# Patient Record
Sex: Female | Born: 1940 | Race: White | Hispanic: No | Smoking: Current every day smoker
Health system: Southern US, Community
[De-identification: ages and names within clinical notes are randomized; demographics above are authoritative.]

## PROBLEM LIST (undated history)

## (undated) DIAGNOSIS — F419 Anxiety disorder, unspecified: Secondary | ICD-10-CM

## (undated) DIAGNOSIS — F329 Major depressive disorder, single episode, unspecified: Secondary | ICD-10-CM

## (undated) DIAGNOSIS — J45909 Unspecified asthma, uncomplicated: Secondary | ICD-10-CM

## (undated) DIAGNOSIS — J449 Chronic obstructive pulmonary disease, unspecified: Secondary | ICD-10-CM

## (undated) DIAGNOSIS — R569 Unspecified convulsions: Secondary | ICD-10-CM

## (undated) DIAGNOSIS — I10 Essential (primary) hypertension: Secondary | ICD-10-CM

## (undated) DIAGNOSIS — J15212 Pneumonia due to Methicillin resistant Staphylococcus aureus: Secondary | ICD-10-CM

## (undated) DIAGNOSIS — F54 Psychological and behavioral factors associated with disorders or diseases classified elsewhere: Secondary | ICD-10-CM

## (undated) DIAGNOSIS — R631 Polydipsia: Secondary | ICD-10-CM

## (undated) DIAGNOSIS — F32A Depression, unspecified: Secondary | ICD-10-CM

## (undated) DIAGNOSIS — E785 Hyperlipidemia, unspecified: Secondary | ICD-10-CM

## (undated) DIAGNOSIS — M436 Torticollis: Secondary | ICD-10-CM

## (undated) DIAGNOSIS — G629 Polyneuropathy, unspecified: Secondary | ICD-10-CM

## (undated) HISTORY — PX: ABDOMINAL HYSTERECTOMY: SHX81

---

## 2003-12-01 ENCOUNTER — Emergency Department: Payer: Self-pay | Admitting: Internal Medicine

## 2003-12-16 ENCOUNTER — Emergency Department: Payer: Self-pay | Admitting: Emergency Medicine

## 2003-12-18 ENCOUNTER — Inpatient Hospital Stay: Payer: Self-pay | Admitting: Internal Medicine

## 2003-12-25 ENCOUNTER — Emergency Department: Payer: Self-pay | Admitting: Emergency Medicine

## 2003-12-27 ENCOUNTER — Emergency Department: Payer: Self-pay | Admitting: Emergency Medicine

## 2004-01-13 ENCOUNTER — Emergency Department: Payer: Self-pay | Admitting: Emergency Medicine

## 2004-01-18 ENCOUNTER — Emergency Department: Payer: Self-pay | Admitting: Emergency Medicine

## 2004-02-13 ENCOUNTER — Emergency Department: Payer: Self-pay | Admitting: General Practice

## 2004-02-16 ENCOUNTER — Emergency Department: Payer: Self-pay | Admitting: Emergency Medicine

## 2004-03-02 ENCOUNTER — Emergency Department: Payer: Self-pay | Admitting: Emergency Medicine

## 2004-03-05 ENCOUNTER — Emergency Department: Payer: Self-pay | Admitting: Emergency Medicine

## 2004-04-03 ENCOUNTER — Emergency Department: Payer: Self-pay | Admitting: General Practice

## 2004-04-30 ENCOUNTER — Emergency Department: Payer: Self-pay | Admitting: Emergency Medicine

## 2004-06-05 ENCOUNTER — Inpatient Hospital Stay: Payer: Self-pay | Admitting: Internal Medicine

## 2004-07-03 ENCOUNTER — Other Ambulatory Visit: Payer: Self-pay

## 2004-07-03 ENCOUNTER — Emergency Department: Payer: Self-pay | Admitting: Emergency Medicine

## 2004-07-08 ENCOUNTER — Emergency Department: Payer: Self-pay | Admitting: Emergency Medicine

## 2004-07-21 ENCOUNTER — Emergency Department: Payer: Self-pay | Admitting: Internal Medicine

## 2004-08-25 ENCOUNTER — Other Ambulatory Visit: Payer: Self-pay

## 2004-08-25 ENCOUNTER — Emergency Department: Payer: Self-pay | Admitting: Emergency Medicine

## 2004-10-14 ENCOUNTER — Emergency Department: Payer: Self-pay | Admitting: Emergency Medicine

## 2004-10-31 ENCOUNTER — Emergency Department: Payer: Self-pay | Admitting: Emergency Medicine

## 2004-10-31 ENCOUNTER — Other Ambulatory Visit: Payer: Self-pay

## 2005-02-06 ENCOUNTER — Emergency Department: Payer: Self-pay | Admitting: Emergency Medicine

## 2005-02-17 ENCOUNTER — Emergency Department: Payer: Self-pay | Admitting: Emergency Medicine

## 2005-03-02 ENCOUNTER — Emergency Department: Payer: Self-pay | Admitting: Internal Medicine

## 2005-03-07 ENCOUNTER — Emergency Department: Payer: Self-pay | Admitting: Emergency Medicine

## 2005-04-30 ENCOUNTER — Emergency Department: Payer: Self-pay | Admitting: Emergency Medicine

## 2005-05-27 ENCOUNTER — Emergency Department: Payer: Self-pay | Admitting: Emergency Medicine

## 2005-06-23 ENCOUNTER — Inpatient Hospital Stay: Payer: Self-pay | Admitting: Internal Medicine

## 2005-06-28 ENCOUNTER — Other Ambulatory Visit: Payer: Self-pay

## 2005-06-29 ENCOUNTER — Other Ambulatory Visit: Payer: Self-pay

## 2005-07-11 ENCOUNTER — Other Ambulatory Visit: Payer: Self-pay

## 2005-09-18 ENCOUNTER — Ambulatory Visit: Payer: Self-pay | Admitting: Family Medicine

## 2005-09-24 ENCOUNTER — Emergency Department: Payer: Self-pay | Admitting: Emergency Medicine

## 2006-12-06 ENCOUNTER — Emergency Department: Payer: Self-pay | Admitting: Emergency Medicine

## 2006-12-16 IMAGING — CR DG CHEST 1V PORT
1 series · 1 of 1 positions shown · non-contrast
Comparison: none

REASON FOR EXAM: Chest pain
COMMENTS:

[view not recorded]
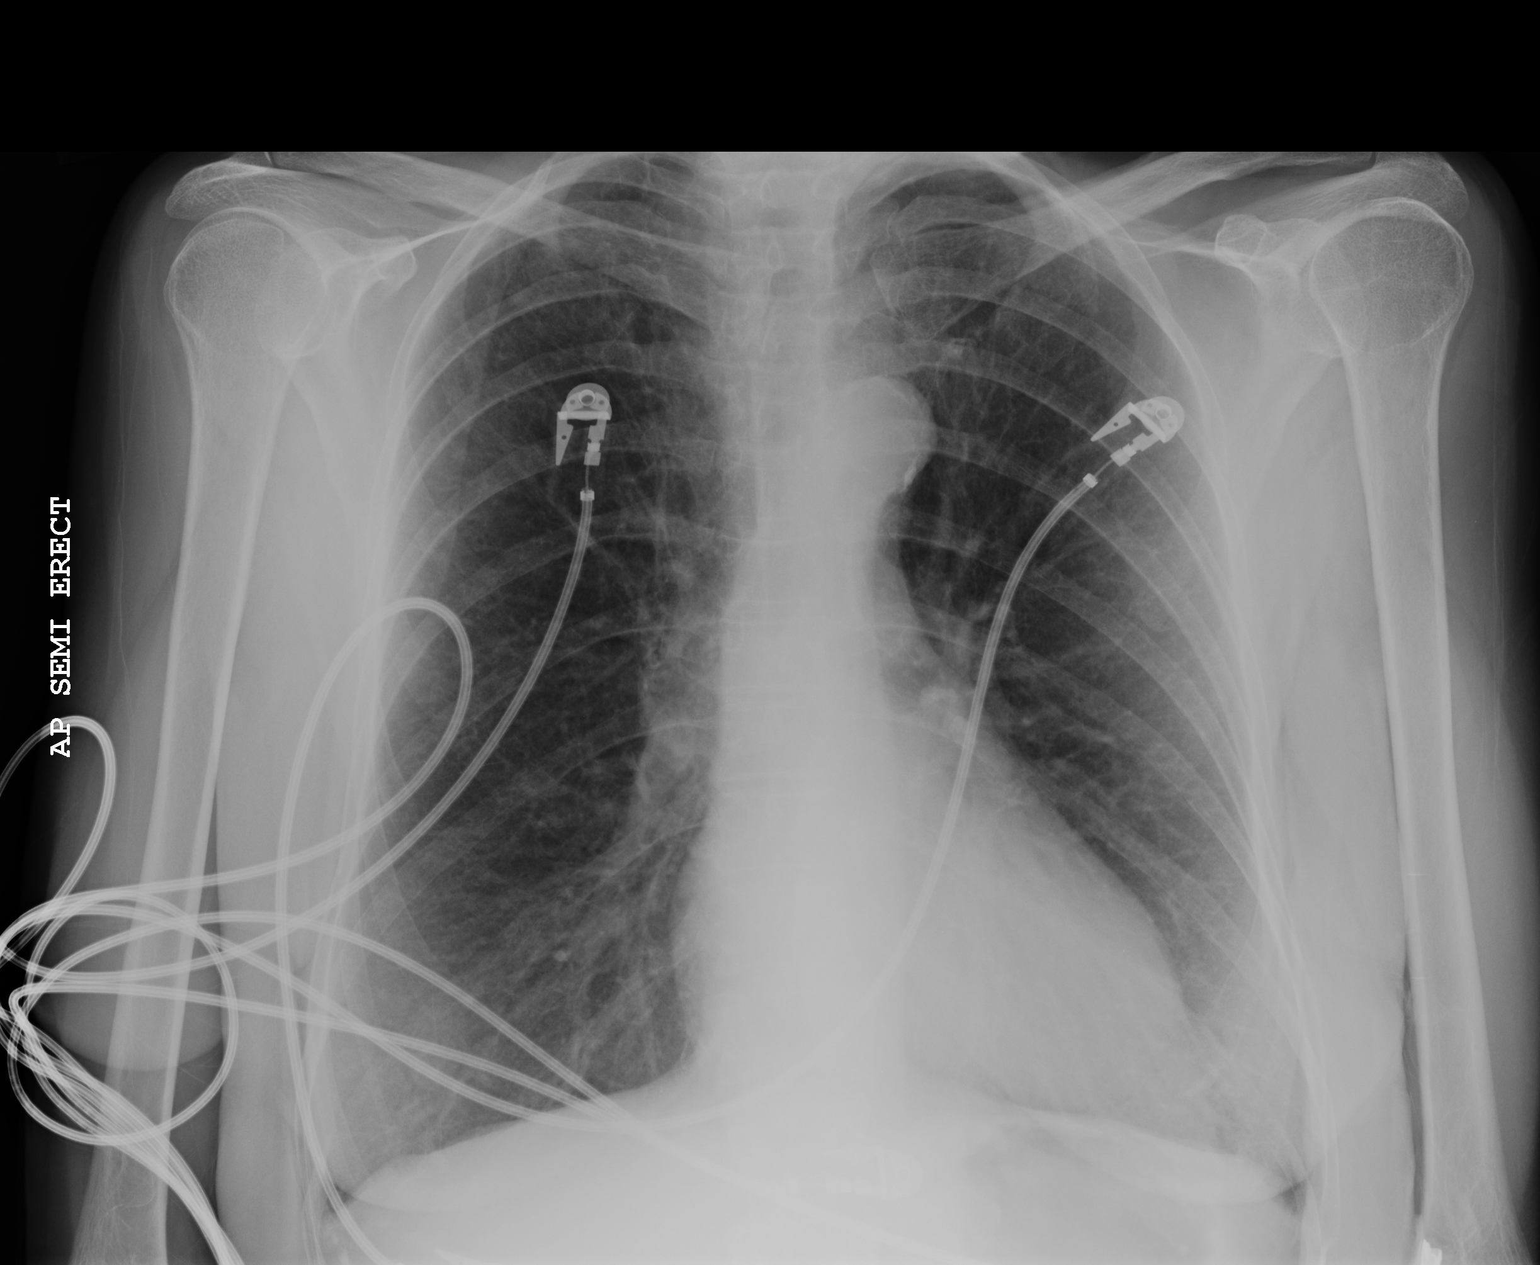

[1 of 1 positions shown; findings below may reference images not displayed]

PROCEDURE:     DXR - DXR PORTABLE CHEST SINGLE VIEW  - June 05, 2004  [DATE]

RESULT:     Comparison is made to a prior PA and lateral study from
03/02/2004.

The lungs are hyperinflated suggestive of COPD or reactive airway disease.
The heart is not enlarged. The interstitial markings are mildly prominent in
a pattern suggestive of underlying interstitial fibrosis. No infiltrate,
effusion or pneumothorax is seen.
IMPRESSION: COPD with probable mild fibrotic interstitial changes.

## 2007-01-14 ENCOUNTER — Ambulatory Visit: Payer: Self-pay | Admitting: Physician Assistant

## 2007-07-09 ENCOUNTER — Other Ambulatory Visit: Payer: Self-pay

## 2007-07-09 ENCOUNTER — Inpatient Hospital Stay: Payer: Self-pay | Admitting: Internal Medicine

## 2007-07-24 ENCOUNTER — Other Ambulatory Visit: Payer: Self-pay

## 2007-07-24 ENCOUNTER — Emergency Department: Payer: Self-pay | Admitting: Emergency Medicine

## 2007-07-31 ENCOUNTER — Other Ambulatory Visit: Payer: Self-pay

## 2007-07-31 ENCOUNTER — Emergency Department: Payer: Self-pay | Admitting: Emergency Medicine

## 2007-08-03 ENCOUNTER — Emergency Department: Payer: Self-pay | Admitting: Emergency Medicine

## 2007-08-04 ENCOUNTER — Emergency Department: Payer: Self-pay | Admitting: Emergency Medicine

## 2007-08-11 ENCOUNTER — Emergency Department: Payer: Self-pay | Admitting: Emergency Medicine

## 2007-08-11 ENCOUNTER — Other Ambulatory Visit: Payer: Self-pay

## 2007-10-03 ENCOUNTER — Emergency Department: Payer: Self-pay | Admitting: Emergency Medicine

## 2007-10-03 ENCOUNTER — Other Ambulatory Visit: Payer: Self-pay

## 2007-12-07 IMAGING — CR DG CHEST 2V
1 series · 3 of 3 positions shown · non-contrast
Comparison: none

REASON FOR EXAM: Shortness of breath
COMMENTS:  LMP: Post-Menopausal

[Series 1: view not recorded · 0.17mm/px · 3 of 3 slices shown]
[im 1/3]
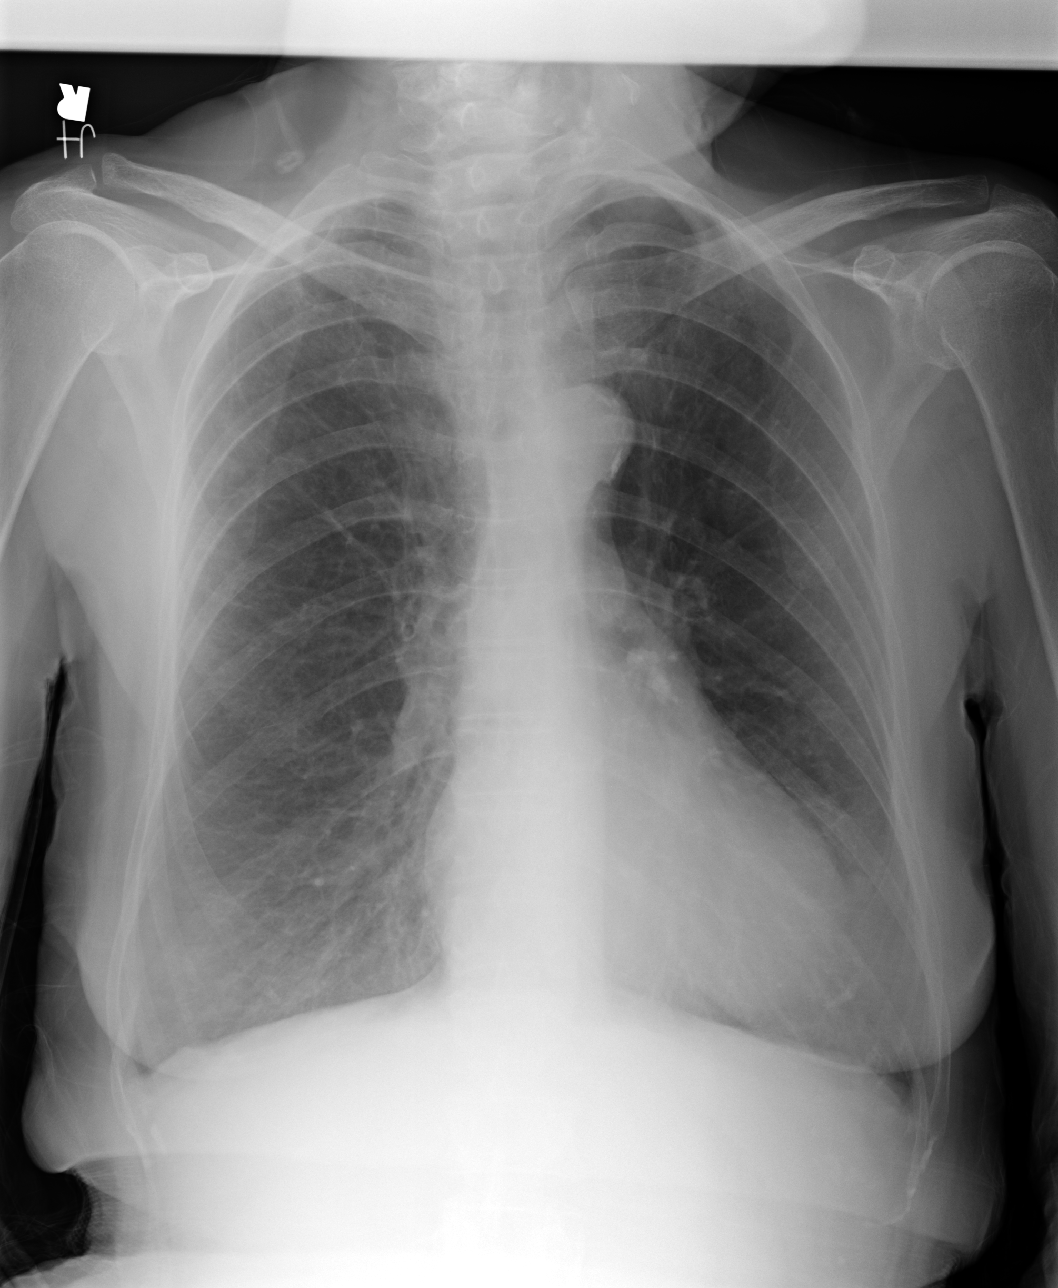
[im 2/3]
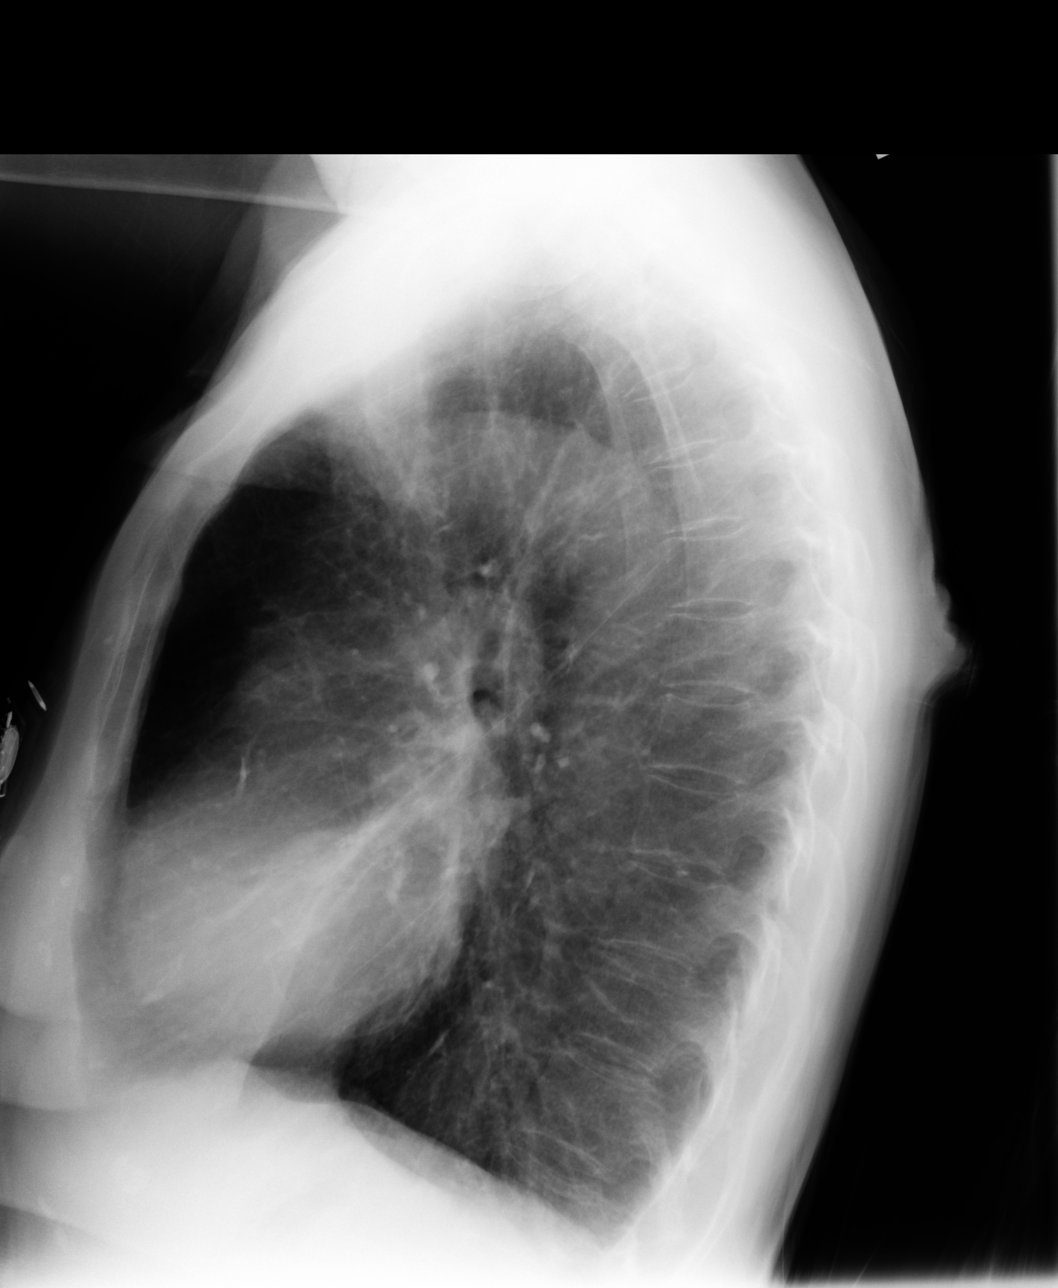
[im 3/3]
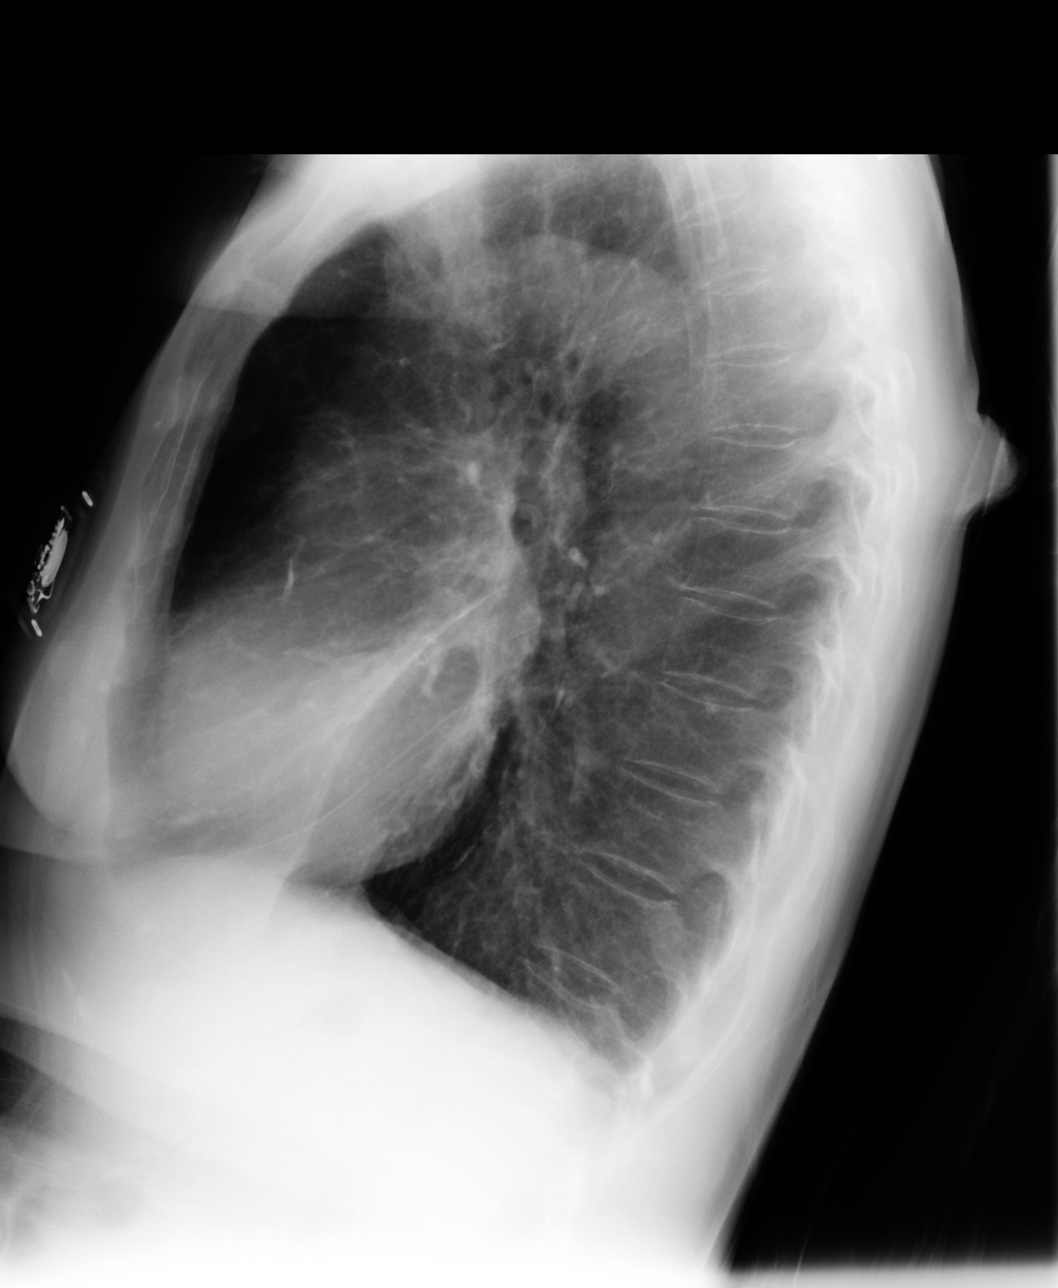

[3 of 3 positions shown; findings below may reference images not displayed]

PROCEDURE:     DXR - DXR CHEST PA (OR AP) AND LATERAL  - May 27, 2005  [DATE]

RESULT:          Comparison is made to a study 06 February, 2005.

The lungs are hyperinflated with hemidiaphragm flattening.  There is no
focal infiltrate.  There is no evidence of a pleural effusion.  The
cardiopericardial silhouette is not enlarged, and the pulmonary vascularity
is not engorged.  I see no acute bony abnormality.

There is calcific density in the LEFT hilar region consistent with
calcification within lymph nodes.
IMPRESSION: There are findings consistent with COPD.  I do not see
evidence of congestive heart failure nor of pneumonia.  I cannot exclude an
element of acute bronchitis in the appropriate clinical setting.

## 2007-12-22 ENCOUNTER — Emergency Department: Payer: Self-pay | Admitting: Emergency Medicine

## 2008-01-13 IMAGING — CR DG CHEST 1V PORT
1 series · 1 of 1 positions shown · non-contrast
Comparison: none

REASON FOR EXAM: Central line placement
COMMENTS:

[view not recorded]
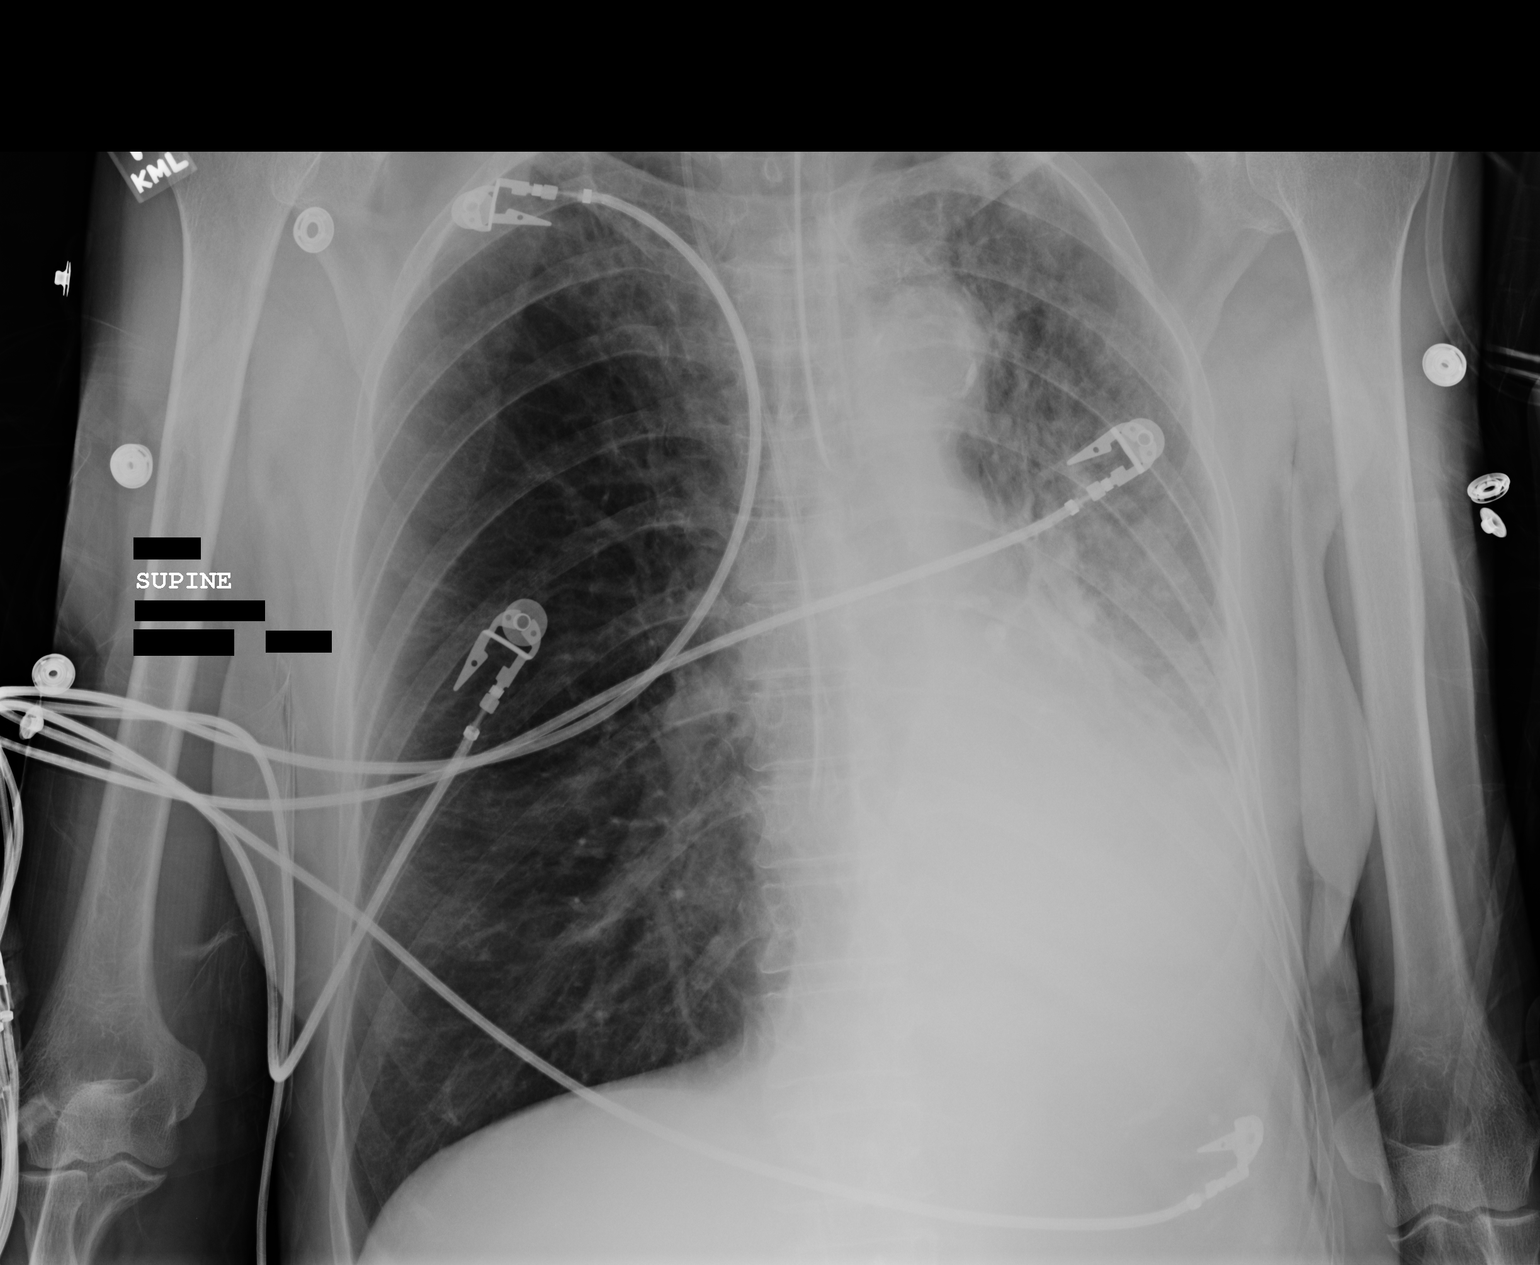

[1 of 1 positions shown; findings below may reference images not displayed]

PROCEDURE:     DXR - DXR PORTABLE CHEST SINGLE VIEW  - July 03, 2005  [DATE]

RESULT:     A single AP view was obtained status post central line
placement. The central line is inserted from the RIGHT jugular vein with its
tip at the junction of the RIGHT atrium and superior vena cava. No
pneumothorax is noted post insertion. ET tube remains in position
approximately 2.0 cm above the carina.
IMPRESSION: Insertion of central line with its tip at the junction of
the SVC and RIGHT atrium.

## 2008-01-13 IMAGING — CR DG CHEST 1V PORT
1 series · 1 of 1 positions shown · non-contrast
Comparison: none

REASON FOR EXAM: line placement intubated
COMMENTS:

[view not recorded]
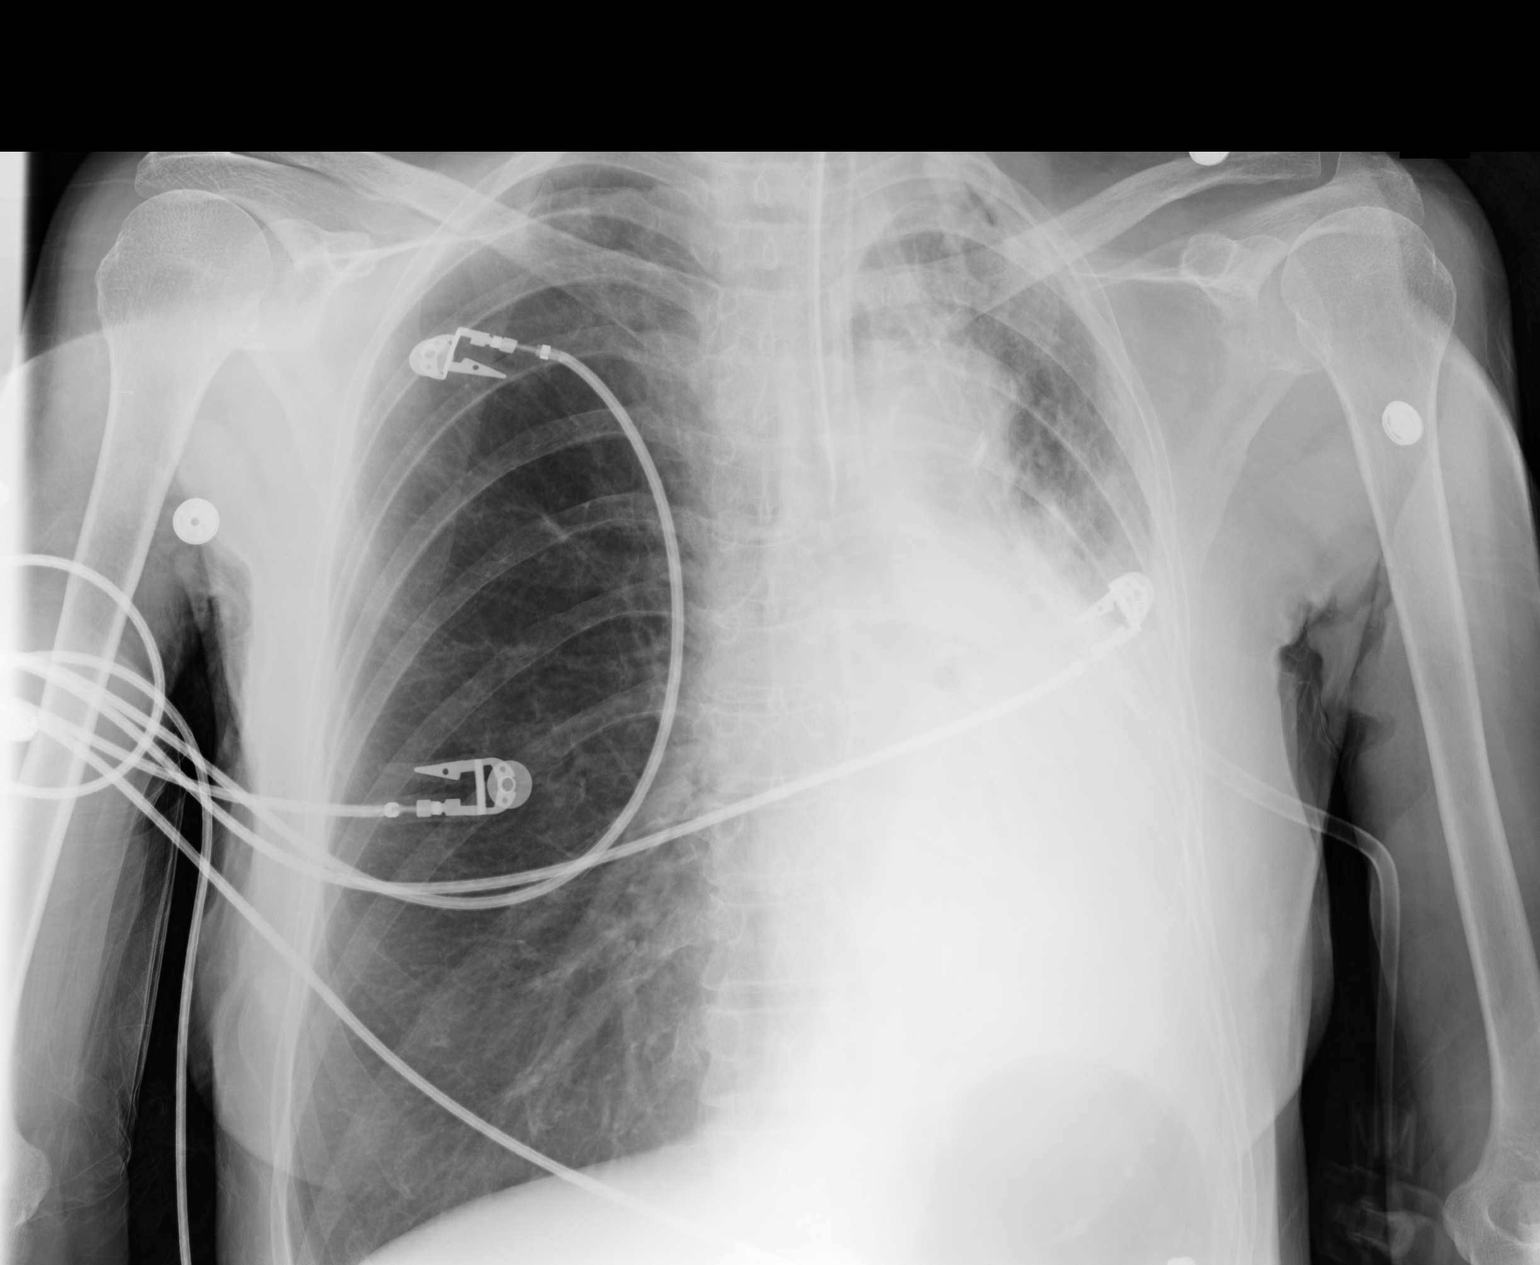

[1 of 1 positions shown; findings below may reference images not displayed]

PROCEDURE:     DXR - DXR PORTABLE CHEST SINGLE VIEW  - July 03, 2005  [DATE]

RESULT:     AP view of the chest shows increased density at the LEFT base
compatible with LEFT basilar atelectasis and with there being a shift of the
heart and mediastinum to the LEFT compatible with volume loss. An
endotracheal tube is now present with the tip approximately 2 cm above the
carina.  The RIGHT lung field is clear.
IMPRESSION: 1)Please see above.

## 2008-01-14 IMAGING — CR DG CHEST 1V PORT
1 series · 1 of 1 positions shown · non-contrast
Comparison: none

REASON FOR EXAM: pneumonia
COMMENTS:

[view not recorded]
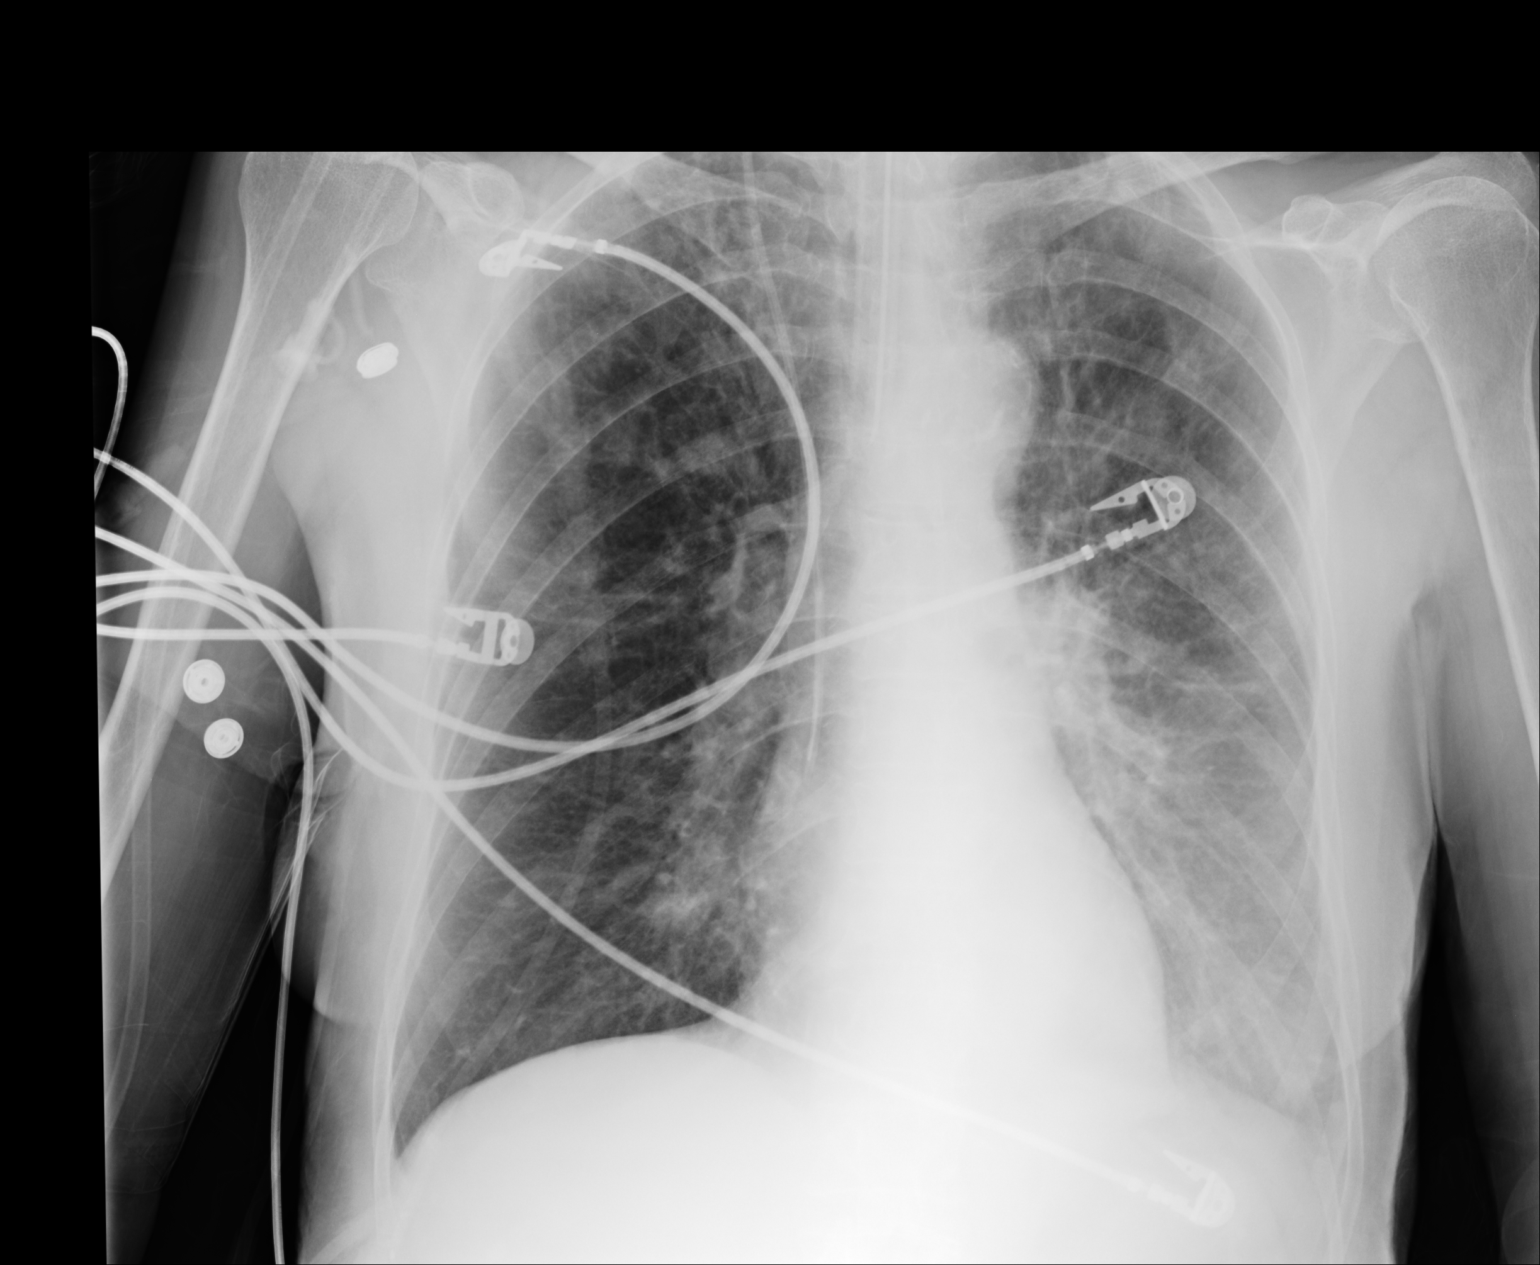

[1 of 1 positions shown; findings below may reference images not displayed]

PROCEDURE:     DXR - DXR PORTABLE CHEST SINGLE VIEW  - July 04, 2005  [DATE]

RESULT:     The current exam is compared to the prior exam of 07-03-05.
There is improvement in the previously noted increased density at the LEFT
base consistent with re-expansion of the LEFT lower lobe atelectasis
previously present. Volume loss on the LEFT is no longer seen.  There is
residual haziness at the LEFT base compatible with residual atelectasis or
pneumonia.  The RIGHT lung field is clear. A venous catheter is present with
the tip near the junction of the RIGHT atrium and superior vena cava.  The
tip of the endotracheal tube is approximately 1 cm above the level of the
carina. Monitoring electrodes are present.
IMPRESSION: 1)There has been improvement in the previously noted density at the LEFT
lung base.

2)The tip of the endotracheal tube is approximately 1 cm above the carina.

## 2008-12-08 ENCOUNTER — Inpatient Hospital Stay: Payer: Self-pay | Admitting: Internal Medicine

## 2012-03-11 ENCOUNTER — Inpatient Hospital Stay: Payer: Self-pay | Admitting: Internal Medicine

## 2012-03-11 LAB — COMPREHENSIVE METABOLIC PANEL
Anion Gap: 9 (ref 7–16)
BUN: 10 mg/dL (ref 7–18)
Bilirubin,Total: 0.4 mg/dL (ref 0.2–1.0)
Chloride: 72 mmol/L — ABNORMAL LOW (ref 98–107)
Co2: 30 mmol/L (ref 21–32)
Creatinine: 0.73 mg/dL (ref 0.60–1.30)
EGFR (Non-African Amer.): >60
Glucose: 112 mg/dL — ABNORMAL HIGH (ref 65–99)
Osmolality: 225 (ref 275–301)
Potassium: 4.1 mmol/L (ref 3.5–5.1)
Sodium: 111 mmol/L — CL (ref 136–145)

## 2012-03-11 LAB — URINALYSIS, COMPLETE
Bacteria: NONE SEEN
Bilirubin,UR: NEGATIVE
Blood: NEGATIVE
Glucose,UR: NEGATIVE mg/dL (ref 0–75)
Leukocyte Esterase: NEGATIVE
Nitrite: NEGATIVE
Ph: 7 (ref 4.5–8.0)
Protein: NEGATIVE
Specific Gravity: 1.003 (ref 1.003–1.030)
Squamous Epithelial: <1

## 2012-03-11 LAB — CBC
MCH: 29.6 pg (ref 26.0–34.0)
MCHC: 33.7 g/dL (ref 32.0–36.0)
MCV: 88 fL (ref 80–100)
Platelet: 218 10*3/uL (ref 150–440)
RBC: 4.99 10*6/uL (ref 3.80–5.20)
WBC: 6.1 10*3/uL (ref 3.6–11.0)

## 2012-03-11 LAB — SODIUM: Sodium: 115 mmol/L — CL (ref 136–145)

## 2012-03-11 LAB — CK TOTAL AND CKMB (NOT AT ARMC): CK, Total: 116 U/L (ref 21–215)

## 2012-03-11 LAB — OSMOLALITY: Osmolality: 240 mOsm/kg — CL (ref 280–301)

## 2012-03-11 LAB — OSMOLALITY, URINE: Osmolality: 86 mOsm/kg

## 2012-03-12 LAB — CBC WITH DIFFERENTIAL/PLATELET
Basophil #: 0 10*3/uL (ref 0.0–0.1)
Basophil %: 0.1 %
Eosinophil %: 0 %
HCT: 45.6 % (ref 35.0–47.0)
HGB: 15.2 g/dL (ref 12.0–16.0)
MCH: 29.5 pg (ref 26.0–34.0)
MCHC: 33.3 g/dL (ref 32.0–36.0)
MCV: 89 fL (ref 80–100)
Monocyte #: 0.1 x10 3/mm — ABNORMAL LOW (ref 0.2–0.9)
Neutrophil #: 5.8 10*3/uL (ref 1.4–6.5)
Platelet: 242 10*3/uL (ref 150–440)
RBC: 5.14 10*6/uL (ref 3.80–5.20)
RDW: 15.5 % — ABNORMAL HIGH (ref 11.5–14.5)
WBC: 6.2 10*3/uL (ref 3.6–11.0)

## 2012-03-12 LAB — BASIC METABOLIC PANEL
BUN: 9 mg/dL (ref 7–18)
Calcium, Total: 9.3 mg/dL (ref 8.5–10.1)
Chloride: 79 mmol/L — ABNORMAL LOW (ref 98–107)
Co2: 32 mmol/L (ref 21–32)
Creatinine: 0.6 mg/dL (ref 0.60–1.30)
EGFR (Non-African Amer.): >60
Sodium: 119 mmol/L — CL (ref 136–145)

## 2012-03-13 LAB — SODIUM, URINE, RANDOM: Sodium, Urine Random: 9 mmol/L (ref 20–110)

## 2012-03-13 LAB — OSMOLALITY, URINE: Osmolality: 533 mOsm/kg

## 2012-03-13 LAB — BASIC METABOLIC PANEL
Anion Gap: 9 (ref 7–16)
Chloride: 77 mmol/L — ABNORMAL LOW (ref 98–107)
Co2: 32 mmol/L (ref 21–32)
EGFR (Non-African Amer.): 52 — ABNORMAL LOW
Glucose: 284 mg/dL — ABNORMAL HIGH (ref 65–99)
Sodium: 118 mmol/L — CL (ref 136–145)

## 2012-03-15 ENCOUNTER — Ambulatory Visit: Payer: Self-pay | Admitting: Internal Medicine

## 2012-03-15 LAB — BASIC METABOLIC PANEL
Anion Gap: 10 (ref 7–16)
Chloride: 81 mmol/L — ABNORMAL LOW (ref 98–107)
Co2: 31 mmol/L (ref 21–32)
EGFR (African American): >60
Glucose: 131 mg/dL — ABNORMAL HIGH (ref 65–99)
Osmolality: 251 (ref 275–301)

## 2012-03-15 LAB — DIFFERENTIAL
Basophil #: 0.1 10*3/uL (ref 0.0–0.1)
Basophil %: 0.5 %
Lymphocyte #: 0.2 10*3/uL — ABNORMAL LOW (ref 1.0–3.6)
Lymphocyte %: 1.4 %
Monocyte #: 0.8 x10 3/mm (ref 0.2–0.9)
Monocyte %: 6 %
Neutrophil #: 13 10*3/uL — ABNORMAL HIGH (ref 1.4–6.5)
Neutrophil %: 92.1 %

## 2012-03-15 LAB — SODIUM, URINE, RANDOM: Sodium, Urine Random: 17 mmol/L (ref 20–110)

## 2012-03-15 LAB — CHLORIDE, URINE, RANDOM: Chloride, Urine Random: 15 mmol/L — ABNORMAL LOW (ref 55–125)

## 2012-03-15 LAB — WBC: WBC: 13.6 10*3/uL — ABNORMAL HIGH (ref 3.6–11.0)

## 2012-03-16 LAB — BASIC METABOLIC PANEL
BUN: 29 mg/dL — ABNORMAL HIGH (ref 7–18)
Chloride: 84 mmol/L — ABNORMAL LOW (ref 98–107)
Co2: 33 mmol/L — ABNORMAL HIGH (ref 21–32)
Creatinine: 0.69 mg/dL (ref 0.60–1.30)
EGFR (Non-African Amer.): >60
Glucose: 107 mg/dL — ABNORMAL HIGH (ref 65–99)
Osmolality: 260 (ref 275–301)
Sodium: 126 mmol/L — ABNORMAL LOW (ref 136–145)

## 2012-03-17 LAB — CULTURE, BLOOD (SINGLE)

## 2012-03-17 LAB — BASIC METABOLIC PANEL
BUN: 49 mg/dL — ABNORMAL HIGH (ref 7–18)
Calcium, Total: 9.6 mg/dL (ref 8.5–10.1)
Chloride: 88 mmol/L — ABNORMAL LOW (ref 98–107)
Chloride: 91 mmol/L — ABNORMAL LOW (ref 98–107)
Co2: 34 mmol/L — ABNORMAL HIGH (ref 21–32)
Co2: 34 mmol/L — ABNORMAL HIGH (ref 21–32)
Creatinine: 0.9 mg/dL (ref 0.60–1.30)
Creatinine: 0.9 mg/dL (ref 0.60–1.30)
EGFR (African American): >60
EGFR (African American): >60
EGFR (Non-African Amer.): >60
Glucose: 174 mg/dL — ABNORMAL HIGH (ref 65–99)
Potassium: 3.9 mmol/L (ref 3.5–5.1)
Potassium: 3.9 mmol/L (ref 3.5–5.1)
Sodium: 131 mmol/L — ABNORMAL LOW (ref 136–145)

## 2012-03-17 LAB — TSH: Thyroid Stimulating Horm: 1.39 u[IU]/mL

## 2012-03-18 LAB — CBC WITH DIFFERENTIAL/PLATELET
Basophil #: 0.1 10*3/uL (ref 0.0–0.1)
Basophil %: 0.2 %
Eosinophil #: 0 10*3/uL (ref 0.0–0.7)
Eosinophil %: 0 %
HCT: 46.5 % (ref 35.0–47.0)
Lymphocyte #: 0.6 10*3/uL — ABNORMAL LOW (ref 1.0–3.6)
MCH: 29.2 pg (ref 26.0–34.0)
MCHC: 32.4 g/dL (ref 32.0–36.0)
MCV: 90 fL (ref 80–100)
Monocyte #: 1.6 x10 3/mm — ABNORMAL HIGH (ref 0.2–0.9)
Monocyte %: 5.7 %
Neutrophil #: 25.3 10*3/uL — ABNORMAL HIGH (ref 1.4–6.5)
Neutrophil %: 92 %
RDW: 16.1 % — ABNORMAL HIGH (ref 11.5–14.5)

## 2012-03-18 LAB — BASIC METABOLIC PANEL
Anion Gap: 6 — ABNORMAL LOW (ref 7–16)
Calcium, Total: 9.5 mg/dL (ref 8.5–10.1)
Co2: 36 mmol/L — ABNORMAL HIGH (ref 21–32)
EGFR (African American): >60
EGFR (Non-African Amer.): >60
Glucose: 127 mg/dL — ABNORMAL HIGH (ref 65–99)
Osmolality: 284 (ref 275–301)
Potassium: 3.8 mmol/L (ref 3.5–5.1)

## 2012-03-18 LAB — PHENYTOIN LEVEL, TOTAL: Dilantin: 14.3 ug/mL (ref 10.0–20.0)

## 2012-03-19 LAB — CBC WITH DIFFERENTIAL/PLATELET
Basophil #: 0.1 10*3/uL (ref 0.0–0.1)
Basophil %: 0.2 %
Eosinophil #: 0 10*3/uL (ref 0.0–0.7)
Eosinophil %: 0 %
HCT: 45.5 % (ref 35.0–47.0)
HGB: 14.5 g/dL (ref 12.0–16.0)
MCHC: 32 g/dL (ref 32.0–36.0)
MCV: 90 fL (ref 80–100)
Monocyte %: 4.8 %
Neutrophil #: 27.5 10*3/uL — ABNORMAL HIGH (ref 1.4–6.5)
Neutrophil %: 93.7 %
Platelet: 249 10*3/uL (ref 150–440)
RBC: 5.04 10*6/uL (ref 3.80–5.20)
RDW: 16.2 % — ABNORMAL HIGH (ref 11.5–14.5)
WBC: 29.4 10*3/uL — ABNORMAL HIGH (ref 3.6–11.0)

## 2012-03-19 LAB — BASIC METABOLIC PANEL
Calcium, Total: 9.6 mg/dL (ref 8.5–10.1)
Chloride: 100 mmol/L (ref 98–107)
Co2: 33 mmol/L — ABNORMAL HIGH (ref 21–32)
Creatinine: 0.59 mg/dL — ABNORMAL LOW (ref 0.60–1.30)
EGFR (Non-African Amer.): >60
Glucose: 132 mg/dL — ABNORMAL HIGH (ref 65–99)
Osmolality: 291 (ref 275–301)
Sodium: 141 mmol/L (ref 136–145)

## 2012-03-19 LAB — PHENYTOIN LEVEL, TOTAL: Dilantin: 18 ug/mL (ref 10.0–20.0)

## 2012-03-20 LAB — CBC WITH DIFFERENTIAL/PLATELET
Basophil #: 0 10*3/uL (ref 0.0–0.1)
Eosinophil #: 0 10*3/uL (ref 0.0–0.7)
Lymphocyte #: 0.6 10*3/uL — ABNORMAL LOW (ref 1.0–3.6)
MCHC: 31.5 g/dL — ABNORMAL LOW (ref 32.0–36.0)
MCV: 91 fL (ref 80–100)
Monocyte %: 8.9 %
Neutrophil #: 18.2 10*3/uL — ABNORMAL HIGH (ref 1.4–6.5)
Neutrophil %: 88.2 %
RBC: 5.01 10*6/uL (ref 3.80–5.20)
RDW: 16 % — ABNORMAL HIGH (ref 11.5–14.5)
WBC: 20.6 10*3/uL — ABNORMAL HIGH (ref 3.6–11.0)

## 2012-03-20 LAB — BASIC METABOLIC PANEL WITH GFR
Anion Gap: 10
BUN: 28 mg/dL — ABNORMAL HIGH
Calcium, Total: 9.5 mg/dL
Chloride: 106 mmol/L
Co2: 32 mmol/L
Creatinine: 0.65 mg/dL
EGFR (African American): >60
EGFR (Non-African Amer.): >60
Glucose: 127 mg/dL — ABNORMAL HIGH
Osmolality: 301
Potassium: 2.8 mmol/L — ABNORMAL LOW
Sodium: 148 mmol/L — ABNORMAL HIGH

## 2012-03-21 LAB — BASIC METABOLIC PANEL
Calcium, Total: 9.2 mg/dL (ref 8.5–10.1)
Chloride: 104 mmol/L (ref 98–107)
Co2: 33 mmol/L — ABNORMAL HIGH (ref 21–32)
Creatinine: 0.75 mg/dL (ref 0.60–1.30)
EGFR (African American): >60
Potassium: 3.4 mmol/L — ABNORMAL LOW (ref 3.5–5.1)
Sodium: 144 mmol/L (ref 136–145)

## 2012-03-21 LAB — DIFFERENTIAL
Basophil %: 0.2 %
Eosinophil %: 0.4 %
Lymphocyte #: 0.8 10*3/uL — ABNORMAL LOW (ref 1.0–3.6)
Lymphocyte %: 4.3 %
Monocyte #: 1.5 x10 3/mm — ABNORMAL HIGH (ref 0.2–0.9)
Monocyte %: 8.2 %
Neutrophil %: 86.9 %

## 2012-03-21 LAB — EXPECTORATED SPUTUM ASSESSMENT W REFEX TO RESP CULTURE

## 2012-03-24 LAB — CBC WITH DIFFERENTIAL/PLATELET
Basophil #: 0 10*3/uL (ref 0.0–0.1)
Basophil %: 0 %
Eosinophil %: 0.3 %
HCT: 39.1 % (ref 35.0–47.0)
HGB: 12.5 g/dL (ref 12.0–16.0)
Lymphocyte #: 0.5 10*3/uL — ABNORMAL LOW (ref 1.0–3.6)
Lymphocyte %: 3.5 %
MCV: 89 fL (ref 80–100)
Monocyte %: 3 %
Neutrophil %: 93.2 %
Platelet: 195 10*3/uL (ref 150–440)
RDW: 16.2 % — ABNORMAL HIGH (ref 11.5–14.5)

## 2012-03-25 LAB — BASIC METABOLIC PANEL
BUN: 13 mg/dL (ref 7–18)
Chloride: 99 mmol/L (ref 98–107)
Co2: 30 mmol/L (ref 21–32)
Creatinine: 0.71 mg/dL (ref 0.60–1.30)
EGFR (Non-African Amer.): >60
Glucose: 102 mg/dL — ABNORMAL HIGH (ref 65–99)
Osmolality: 272 (ref 275–301)
Sodium: 136 mmol/L (ref 136–145)

## 2012-03-26 LAB — CBC WITH DIFFERENTIAL/PLATELET
Basophil #: 0 10*3/uL (ref 0.0–0.1)
Basophil %: 0.6 %
Eosinophil #: 0.1 10*3/uL (ref 0.0–0.7)
HCT: 37.5 % (ref 35.0–47.0)
HGB: 12.2 g/dL (ref 12.0–16.0)
Lymphocyte %: 22 %
MCHC: 32.4 g/dL (ref 32.0–36.0)
MCV: 89 fL (ref 80–100)
Neutrophil #: 4.8 10*3/uL (ref 1.4–6.5)
Platelet: 202 10*3/uL (ref 150–440)
RBC: 4.2 10*6/uL (ref 3.80–5.20)

## 2012-03-26 LAB — POTASSIUM: Potassium: 3.8 mmol/L (ref 3.5–5.1)

## 2012-04-12 ENCOUNTER — Ambulatory Visit: Payer: Self-pay | Admitting: Internal Medicine

## 2012-09-05 ENCOUNTER — Inpatient Hospital Stay: Payer: Self-pay | Admitting: Internal Medicine

## 2012-09-05 LAB — COMPREHENSIVE METABOLIC PANEL
Alkaline Phosphatase: 140 U/L — ABNORMAL HIGH (ref 50–136)
BUN: 9 mg/dL (ref 7–18)
Bilirubin,Total: 0.4 mg/dL (ref 0.2–1.0)
Calcium, Total: 8.8 mg/dL (ref 8.5–10.1)
Chloride: 78 mmol/L — ABNORMAL LOW (ref 98–107)
Co2: 29 mmol/L (ref 21–32)
EGFR (African American): >60
EGFR (Non-African Amer.): >60
Glucose: 93 mg/dL (ref 65–99)
Potassium: 3.2 mmol/L — ABNORMAL LOW (ref 3.5–5.1)
SGOT(AST): 45 U/L — ABNORMAL HIGH (ref 15–37)
Sodium: 114 mmol/L — CL (ref 136–145)
Total Protein: 7.2 g/dL (ref 6.4–8.2)

## 2012-09-05 LAB — URINALYSIS, COMPLETE
Bilirubin,UR: NEGATIVE
Glucose,UR: NEGATIVE mg/dL (ref 0–75)
Nitrite: POSITIVE
Ph: 6 (ref 4.5–8.0)
RBC,UR: 1 /HPF (ref 0–5)
Specific Gravity: 1.006 (ref 1.003–1.030)
Squamous Epithelial: NONE SEEN
WBC UR: 3 /HPF (ref 0–5)

## 2012-09-05 LAB — CBC
HCT: 37.7 % (ref 35.0–47.0)
Platelet: 287 10*3/uL (ref 150–440)
RDW: 13.1 % (ref 11.5–14.5)

## 2012-09-05 LAB — CK: CK, Total: 811 U/L — ABNORMAL HIGH (ref 21–215)

## 2012-09-05 LAB — SODIUM: Sodium: 119 mmol/L — CL (ref 136–145)

## 2012-09-05 LAB — OSMOLALITY, URINE: Osmolality: 188 mOsm/kg

## 2012-09-05 LAB — OSMOLALITY: Osmolality: 234 mOsm/kg — CL (ref 280–301)

## 2012-09-06 LAB — CBC WITH DIFFERENTIAL/PLATELET
Basophil #: 0 10*3/uL (ref 0.0–0.1)
Eosinophil #: 0 10*3/uL (ref 0.0–0.7)
Eosinophil %: 0.2 %
HCT: 34.7 % — ABNORMAL LOW (ref 35.0–47.0)
Lymphocyte %: 9.2 %
MCH: 31.4 pg (ref 26.0–34.0)
MCHC: 35.4 g/dL (ref 32.0–36.0)
MCV: 89 fL (ref 80–100)
Monocyte #: 0.3 x10 3/mm (ref 0.2–0.9)
Neutrophil %: 85.8 %
RBC: 3.91 10*6/uL (ref 3.80–5.20)
RDW: 13 % (ref 11.5–14.5)

## 2012-09-06 LAB — BASIC METABOLIC PANEL
Anion Gap: 6 — ABNORMAL LOW (ref 7–16)
BUN: 9 mg/dL (ref 7–18)
Co2: 29 mmol/L (ref 21–32)
Creatinine: 0.65 mg/dL (ref 0.60–1.30)
EGFR (African American): >60
Glucose: 140 mg/dL — ABNORMAL HIGH (ref 65–99)
Osmolality: 245 (ref 275–301)
Potassium: 3.5 mmol/L (ref 3.5–5.1)

## 2012-09-06 LAB — SODIUM
Sodium: 120 mmol/L — CL (ref 136–145)
Sodium: 122 mmol/L — ABNORMAL LOW (ref 136–145)

## 2012-09-07 LAB — BASIC METABOLIC PANEL
BUN: 19 mg/dL — ABNORMAL HIGH (ref 7–18)
Calcium, Total: 9.1 mg/dL (ref 8.5–10.1)
Chloride: 88 mmol/L — ABNORMAL LOW (ref 98–107)
Co2: 31 mmol/L (ref 21–32)
EGFR (African American): >60
Potassium: 3.8 mmol/L (ref 3.5–5.1)

## 2012-09-08 LAB — BASIC METABOLIC PANEL
Anion Gap: 5 — ABNORMAL LOW (ref 7–16)
BUN: 25 mg/dL — ABNORMAL HIGH (ref 7–18)
Calcium, Total: 8.8 mg/dL (ref 8.5–10.1)
Co2: 32 mmol/L (ref 21–32)
EGFR (African American): >60
Glucose: 171 mg/dL — ABNORMAL HIGH (ref 65–99)
Sodium: 127 mmol/L — ABNORMAL LOW (ref 136–145)

## 2012-09-09 LAB — BASIC METABOLIC PANEL
Chloride: 91 mmol/L — ABNORMAL LOW (ref 98–107)
Co2: 30 mmol/L (ref 21–32)
Creatinine: 0.66 mg/dL (ref 0.60–1.30)
EGFR (Non-African Amer.): >60
Glucose: 117 mg/dL — ABNORMAL HIGH (ref 65–99)
Osmolality: 260 (ref 275–301)

## 2012-09-10 LAB — BASIC METABOLIC PANEL
Anion Gap: 6 — ABNORMAL LOW (ref 7–16)
BUN: 23 mg/dL — ABNORMAL HIGH (ref 7–18)
Calcium, Total: 8.9 mg/dL (ref 8.5–10.1)
Co2: 29 mmol/L (ref 21–32)
Creatinine: 0.67 mg/dL (ref 0.60–1.30)
EGFR (African American): >60
Osmolality: 260 (ref 275–301)
Sodium: 127 mmol/L — ABNORMAL LOW (ref 136–145)

## 2012-09-11 LAB — RENAL FUNCTION PANEL
Albumin: 2.8 g/dL — ABNORMAL LOW (ref 3.4–5.0)
Anion Gap: 4 — ABNORMAL LOW (ref 7–16)
BUN: 24 mg/dL — ABNORMAL HIGH (ref 7–18)
Calcium, Total: 8.7 mg/dL (ref 8.5–10.1)
Chloride: 93 mmol/L — ABNORMAL LOW (ref 98–107)
Creatinine: 0.69 mg/dL (ref 0.60–1.30)
Glucose: 124 mg/dL — ABNORMAL HIGH (ref 65–99)
Osmolality: 261 (ref 275–301)
Phosphorus: 4 mg/dL (ref 2.5–4.9)
Potassium: 4.4 mmol/L (ref 3.5–5.1)

## 2013-08-11 ENCOUNTER — Ambulatory Visit: Payer: Self-pay | Admitting: Family

## 2014-06-04 NOTE — Discharge Summary (Signed)
PATIENT NAME:  [name], Candace M MR#:  [id number] DATE OF BIRTH:  11-21-1940  DATE OF ADMISSION:  09/05/2012 DATE OF DISCHARGE:  09/11/2012  DISPOSITION:  To [facility]  HISTORY AND HOSPITAL COURSE:  Please refer to interim discharge summary dictation done by Dr. Nemiah CommanderKalisetti yesterday. Today the [facility] has [facility]. Yesterday the patient could not go because pending FL2. Today the patient is ready to [facility].  Discharge summary stays the same, and the patient's sodium is still around 127, but she is not symptomatic.  Ordered the fluid restriction and also regular diet. The patient needs to have repeat BMP done. As Dr. Nemiah CommanderKalisetti dictated, she needs to have  repeat BMP in 3 to 4 days.  DISCHARGE DIAGNOSES: 1.  Acute on chronic hyponatremia.  2.  Psychogenic polydipsia. 3.  History of seizure disorder.  4.  History of methicillin-resistant Staphylococcus aureus pneumonia in February 2014. 5.  Acute on chronic pulmonary disease exacerbation.  6.  Anxiety and depression. 7.  Right torticollis. 8.  Hypertension.  9.  Peripheral neuropathy.  10.  Urinary retention with Foley cath.  11.  Acute respiratory failure due to chronic obstructive pulmonary disease exacerbation.   DISCHARGE MEDICATIONS: Are same as Dr. Nemiah CommanderKalisetti dictated.  DISCHARGE CONDITION:  The patient's vitals today:  Temp 97.9, heart rate 82, blood pressure 109/67 and sats are 93% on 2 liters. [facility]. Wean her off oxygen as tolerated.   TIME SPENT ON DISCHARGE PREPARATION: More than 30 minutes including reviewing all the records. ____________________________ Katha HammingSnehalatha Karthik Whittinghill, MD [id number] D: 09/11/2012 11:33:19 ET T: 09/11/2012 11:41:23 ET JOB#: [id number]  cc: Katha HammingSnehalatha Jahsir Rama, MD, <Dictator> Katha HammingSNEHALATHA Claudean Leavelle MD ELECTRONICALLY SIGNED 09/24/2012 16:28

## 2014-06-04 NOTE — H&P (Signed)
PATIENT NAME:  Candace Woods, Raechal M MR#:  [id number] DATE OF BIRTH:  Dec 25, 1940  DATE OF ADMISSION:  09/05/2012  PRIMARY CARE PHYSICIAN: At [facility], Dr. Shawn StallHollingsworth.   CHIEF COMPLAINT: Acute hyponatremia.   HISTORY OF PRESENT ILLNESS: This is a 74 year old female who was referred to the Emergency Room today due to acute hyponatremia. The patient apparently had a sodium level checked at her primary care physician's office and it was noted to be 116. She was sent over to the ER and noted to have a sodium of 114 here in the Emergency Room. The patient has a previous history of hyponatremia in January of this past year, but it was secondary to psychogenic polydipsia. The patient does admit to having increasing intake of water over the past few days, but she says that she feels thirsty all the time. She has had no seizure-type activity. She does not appear to be altered. She has no other systemic complaints presently. She does continue to have some chronic shortness of breath and wheezing; likely this is underlying COPD. Hospitalist services were contacted for further treatment and evaluation.   REVIEW OF SYSTEMS:    CONSTITUTIONAL: No documented fever. No weight gain. No weight loss.  EYES: No blurry or double vision.  EARS, NOSE, THROAT: No tinnitus. No postnasal drip. No redness of the oropharynx.  RESPIRATORY: Positive cough. Positive wheeze. Positive COPD.  CARDIOVASCULAR: No chest pain. No orthopnea. No palpitations. No syncope.  GASTROINTESTINAL: No nausea. No vomiting. No diarrhea. No abdominal pain. No melena or hematochezia.  GENITOURINARY: No dysuria. No hematuria.  ENDOCRINE: No polyuria, nocturia, heat or cold intolerance.  HEMATOLOGIC: No anemia. No bruising. No bleeding.  INTEGUMENTARY: No rashes. No lesions.  MUSCULOSKELETAL: No arthritis. No swelling. No gout.  NEUROLOGIC: No numbness. No tingling. No ataxia. No seizure-type activity.  PSYCHIATRIC: Positive anxiety. Positive  depression. No ADD.   PAST MEDICAL HISTORY: Consistent with COPD, hyperlipidemia, history of torticollis, history of seizures, history of depression/anxiety, history of psychogenic polydipsia and hyponatremia, history of MRSA pneumonia.   ALLERGIES: ASPIRIN, CODEINE, PENICILLIN AND SULFA DRUGS.   SOCIAL HISTORY: Does have a long smoking history, 50+ years, but says that she has quit for the past 6 months. No alcohol abuse. No illicit drug abuse. [place]   FAMILY HISTORY: Mother died from a stroke with a cerebral aneurysm. She cannot recall what her father died from.   CURRENT MEDICATIONS: Loratadine 10 mg daily, albuterol inhaler 2 puffs q.4-6 hours as needed, Tylenol 650 q.6-8 hours as needed, Zoloft 100 mg daily, ranitidine 150 mg b.i.d., Advair 250/50 one puff b.i.d. Spiriva 1 puff daily, gabapentin 100 mg daily, baclofen 10 mg b.i.d., simvastatin 20 mg daily, Xanax 0.5 mg 1 tab b.i.d. as needed, Ambien 5 mg at bedtime, Dilantin 300 mg at bedtime, Robitussin 10 mL q.6 hours as needed, Cepacol lozenge q.3 hours as needed, DuoNebs q.4-6 hours as needed and Zofran 4 mg q.8 hours as needed.   PHYSICAL EXAMINATION: VITAL SIGNS: Temperature is 98.3, pulse 76, respirations 20, blood pressure 140/64, sats 100% on 2 liters nasal cannula.  GENERAL: She is a pleasant-appearing female with audible wheezing, but in no apparent distress.  HEENT: The patient is atraumatic, normocephalic. Her extraocular muscles are intact. Her pupils are equal and reactive to light. Sclerae anicteric. No conjunctival injection. No oropharyngeal erythema.  NECK: She has some torticollis and her neck is deviated to the left side. Otherwise, no jugular venous distention, no bruits,  no lymphadenopathy, no thyromegaly.  HEART: Regular rate and rhythm, tachycardic. No murmurs. No rubs. No clicks.  LUNGS: She has coarse wheezing diffusely bilaterally. Negative use of accessory muscles,  dullness to percussion.  ABDOMEN: Soft, flat, nontender, nondistended. Has good bowel sounds. No hepatosplenomegaly appreciated.  EXTREMITIES: No evidence of any cyanosis, clubbing or peripheral edema. She has +2 pedal and radial pulses bilaterally. She does have a bruise on her right knee and her left knee from a recent fall a couple of days back.  NEUROLOGIC: She is alert, awake, oriented x 3. Globally weak, otherwise moves all extremities spontaneously.  SKIN: Moist, warm. She does have a bilateral bruising on her knees from a recent fall, otherwise no other rashes.  LYMPHATIC: There is no cervical or axillary lymphadenopathy.   LABORATORY DATA: Serum glucose 93, BUN 9, creatinine 0.6, sodium 114, potassium 3.2, chloride 78, bicarbonate 29. The patient's LFTs are within normal limits. White cell count 11.2, hemoglobin 13.1, hematocrit 37.7, platelet count 287.   ASSESSMENT AND PLAN: This is a 74 year old female with history of chronic obstructive pulmonary disease, history of hyponatremia secondary to psychogenic polydipsia, history of torticollis, history of seizures, depression/anxiety, who presents to the hospital due to acute hyponatremia and also urinary retention.  1.  Acute hyponatremia: This is likely euvolemic hyponatremia, probably secondary to psychogenic polydipsia as she has a history of it. For now, I will place her on fluid restriction at  1200 mL per day, follow serum sodiums closely, check a urine and serum osm. I will get a nephrology consult. I discussed the case with Dr. Cherylann Ratel who will see the patient.  2.  Chronic obstructive pulmonary disease exacerbation: This is likely secondary to ongoing tobacco abuse and bronchitis. I will continue her on some intravenous steroids, around-the-clock nebulizer treatments. Continue her Advair and Spiriva. She has not had a portable chest x-ray so I will get one.  3.  History of seizures: No acute seizure-type activity presently. Continue her  Dilantin.  4.  Depression/anxiety: Continue with the Zoloft and Xanax.  5.  Neuropathy: Continue Neurontin.  6.  History of torticollis: Continue baclofen.  7.  Hyperlipidemia: Continue simvastatin.  8.   Urinary retention: The patient apparently presented to the hospital with urinary retention and on bladder scan was noted to have greater than 600 mL of urine in her bladder. She has a Foley catheter now and she is urinating well. For now, I will continue Foley catheter. Start her on some Flomax. She may benefit from an outpatient urology referral.   CODE STATUS: The patient is a full code.   TIME SPENT ON ADMISSION: 50 minutes.   CRITICAL CARE TIME SPENT: Also 50 minutes.    ____________________________ Rolly Pancake. Cherlynn Kaiser, MD [id number] D: 09/05/2012 17:57:02 ET T: 09/05/2012 18:39:39 ET JOB#: [id number]  cc: Rolly Pancake. Cherlynn Kaiser, MD, <Dictator> Houston Siren MD ELECTRONICALLY SIGNED 09/13/2012 15:12

## 2014-06-04 NOTE — Consult Note (Signed)
Referring Physician:  Henreitta Leber   Primary Care Physician:  Selinda Michaels, [address], [place], [place] [address], [place]  Reason for Consult:  Admit Date: 11-Mar-2012   Chief Complaint: SOB   Reason for Consult: seizure   History of Present Illness:  History of Present Illness:   Patient is intubated and sedated so history is gathered from prior notes and speaking with physician and nursing teams caring for her. OF PRESENT ILLNESS: year old woman initially presented last week with COPD exacerbation and shortness of breath.  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  Sodium level has gradually been increased per guidelines.  However, patient today began having multiple seizures on the floor.  Unknown cause.  No fevers.  Loaded and fosphenytoin and given Ativan which broke the seizures.  The symptoms were very severe.  Nothing brought them on in particular.  Made better with Ativan.  Lastled a short time, less than a minute or two.  However theres been multiple seizures today.  None after Ativan and fosphenytoin load.  Continues to be somewhat hypoxic.  Sodium levels slowly trend up into 125-130 range.        Cannot perform since patient intubated and unresponsive. MEDICAL HISTORY: COPD, tobacco abuse, hypertension, hyperlipidemia, anxiety/depression, history of neuropathy.  HISTORY: Does have a long smoking history, has been smoking for the past 50+ years. No alcohol abuse. No illicit drug abuse. Lives at home with her daughter now.  HISTORY: Mother died from a stroke with a cerebral aneurysm. She cannot recall what her father died from.  MEDICATIONS: Tylenol 650 q.6-8 hours as needed, Advair 250/50 one puff b.i.d., albuterol nebulizer q.6 hours as needed, Xanax 0.5 mg t.i.d. amlodipine 2.5 mg daily, baclofen 10 mg b.i.d., gabapentin 100 mg daily, loratadine 10 mg daily, Nasonex 50 mcg 2 sprays daily, ranitidine 150 mg  b.i.d., simvastatin 20 mg daily, Spiriva 1 puff daily, trazodone 100 mg daily, triamcinolone 0.1% topical cream b.i.d., Ventolin inhaler 2 puffs q.4-6 hours as needed, Westcort topical ointment to the dermatitis area b.i.d. as needed and Zoloft 100 mg daily.   ALLERGIES: ASPIRIN WHICH CAUSES GI DISTRESS, CODEINE WHICH CAUSES A RASH, FLU VACCINE WHICH CAUSES HIVES, PENICILLIN CAUSES RASH AND SULFA DRUGS CAUSE A RASH.   GENERAL: Intubated and unresponsive. exam shows minimal reactivity to light. and S2 sounds are within normal limits, without murmurs, gallops, or rubs. - Normal- NormalDrift - Cannot perform due to patient status intubated and unresonsive in ICU.- Cannot perform due to patient status intubated and unresonsive in ICU.- Cannot assess due to patient status intubated and unresonsive in ICU.  NEUROLOGICAL:STATUS: Cannot assess due to patient status intubated and unresonsive in ICU. NERVES: Cannot assess due to patient status intubated and unresonsive in ICU.  Cannot assess due to patient status intubated and unresonsive in ICU.  Trace throughout  COORDINATION/CEREBELLAR: Cannot assess due to patient status intubated and unresonsive in ICU.  74 year old woman initially presented last week with COPD exacerbation and shortness of breath.  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  Sodium level has gradually been increased per guidelines.  However, patient today began having multiple seizures on the floor.   personally viewed, unremarkable.  If no improvement, HCT should be repeated in 48 hours to rule out ischemic stroke.  No clear cause for new seizures, no fevers, WBC only marginally elevated possibly from stress reaction.  Continue fosphenytoin with maintenance dosing based on weight.  Check total and free phenytoin levels Tuesday and Wednesday to get an accurate measure, then only check total phenytoin levels thereafter.  Rec routine EEG to evaluate for  ongoing ictal vs interictal activity, rule out NCSE.  Sodium levels are normalizing, do not appear to have risen too fast, appear to be in good range so chance of central pontine myelinolysis less likely.  Consider LP if no improvement after workup completed over next 48 hours.     Melrose Nakayama, MD  Past Medical/Surgical Hx:  MRSA: SPUTUM SPECIMEN FROM 07-04-05  Panic Attacks:   Cardiac Arrest:   torticolus:   Anxiety:   Hypertension:   COPD:   Asthma:   Hernia, Hiatal:   Torn bladder repair:   Hysterectomy - Total:   sigmoid colectomy:   Home Medications: Medication Instructions Last Modified Date/Time  Polytrim 10,000 units-1 mg/mL ophthalmic solution 1 drop(s) to each affected eye 4 times a day 28-Jan-14 17:34  loratadine 10 mg oral tablet 1 tab(s) orally once a day for congestion and allergies 28-Jan-14 17:34  Ventolin HFA 90 mcg/inh inhalation aerosol 2 puff(s) inhaled every 4 to 6 hours, As Needed- for Wheezing  28-Jan-14 17:34  acetaminophen 325 mg oral tablet 2 tab(s) orally every 6 to 8 hours, As Needed for fever/pains 28-Jan-14 17:34  trazodone 100 mg oral tablet 1 tab(s) orally once a day (at bedtime) 28-Jan-14 17:34  Zoloft 100 mg oral tablet 1 tab(s) orally once a day for depression 28-Jan-14 17:34  ranitidine 150 mg oral tablet 1 tab(s) orally 2 times a day for heartburn 28-Jan-14 17:34  Advair Diskus 250 mcg-50 mcg inhalation powder 1 puff(s) inhaled 2 times a day for COPD 28-Jan-14 17:34  Spiriva 18 mcg inhalation capsule 1 each inhaled once a day for COPD 28-Jan-14 17:34  gabapentin 100 mg oral capsule 1 cap(s) orally once a day 28-Jan-14 17:34  amlodipine 2.5 mg oral tablet 1 tab(s) orally once a day for blood pressure 28-Jan-14 17:34  baclofen 10 mg oral tablet 1 tab(s) orally 2 times a day at 8 am and at 2 pm 28-Jan-14 17:34  simvastatin 20 mg oral tablet 1 tab(s) orally once a day 28-Jan-14 17:34  albuterol 2.5 mg/3 mL (0.083%) inhalation solution 3 milliliter(s) inhaled  every 6 hours, As Needed for cough 28-Jan-14 17:34  alprazolam 0.5 mg oral tablet 1 tab(s) orally 3 times a day for anxiety/nerves 28-Jan-14 17:34  Nasonex 50 mcg/inh nasal spray 2 spray(s) nasal once a day for allergies/sinus congestion 28-Jan-14 17:34  Westcort 0.2% topical ointment Apply topically to right ear 2 times a day until healed for dermatitis 28-Jan-14 17:34  triamcinolone 0.1% topical cream Apply topically to affected area 2 times a day 28-Jan-14 17:34   [facility] Current Meds:  Dexmedetomidine Drip,  ( Precedex Drip ) 400 mcg /NS 100 ml (premix)  Concentration: (4 mcg/ml) - Final Volume: 100 mL, Dose Rate: 0.5 mcg/kg/hr at initial rate of 9.9 ml/hr  -Indication:sedation  Acetaminophen * tablet, ( Tylenol (325 mg) tablet)  650 mg Oral q4h PRN for pain or temp. greater than 100.4  - Indication: Pain/Fever  Enoxaparin injection, ( Lovenox injection )  40 mg, Subcutaneous, daily  Indication: Prophylaxis or treatment of thromboembolic disorders, Monitor Anticoags per hospital protocol  Ondansetron injection, ( Zofran injection )  4 mg, IV push, q4h PRN for Nausea/Vomiting  Indication: Nausea/ Vomiting  Gabapentin  capsule, ( Neurontin)  100 mg Oral daily  - Indication: Seizures/ Mood  Stabilizer  amLODIPine tablet, ( Norvasc)  2.5 mg Oral daily  - Indication: Hypertension/ Angina  Fluticasone/Salmeterol 250/50 inhaler, ( Advair Diskus 250/50 inhaler )  1 puff(s) Inhalation bid  Instructions:  RINSE MOUTH AFTER USE  Loratadine tablet, ( Claritin)  10 mg Oral daily  - Indication: Allergy/ Cold  Simvastatin tablet, ( Zocor)  20 mg Oral at bedtime  - Indication: Hypercholesterolemia  Tiotropium Handihaler 5'S, ( SPiriVA Handihaler )  1 capsule(s) Inhalation daily  Capsules are for use in inhaler - not for oral use.  Baclofen tablet, ( Lioresal)  10 mg Oral bid  - Indication: Skeletal Muscle Relaxant/ Muscle Spasms  Ranitidine tablet, ( ZanTAC)  150 mg Oral bid  -  Indication: Hyperacidity  Nicotine Patch,  ( Nicoderm CQ patch )  14 mg Transdermal daily  -Indication:Smoking Cessation  Instructions:  [Waste Code: Black with pkg]  Magnesium Hydroxide Suspension,  ( MOM suspension )  30 ml Oral at bedtime PRN for constipation  -Indication:Constipation/ Laxative  Zolpidem tablet,  ( Ambien)  5 mg Oral at bedtime  - Indication: Insomnia  Sertraline tablet, ( Zoloft)  50 mg Oral daily  - Indication: Depression/ OCD/ PTSD/ Panic Disorder  Levofloxacin injection,  ( Levaquin injection )  250 mg, IV Piggyback, q24h, Infuse over 60 minute(s)  Indication: Infection  Sodium Chloride 0.9% injection, 2 ml, IV push, q6h  CloZAPine tablet, ( Clozaril)  25 mg Oral at bedtime  - Indication: Schizophrenia  cloNIDine  tablet, ( Catapres)  0.1 mg Oral q6h PRN for hypertension  - Indication: Hypertension/ Withdrawal Symptoms  Instructions:  for SBP > 160  LORazepam injection,  ( Ativan injection )  0.25 mg, IV push, q6h PRN for agitation  Indication: Anxiety/ Seizure/ Antiemetic Adjunct/ Preop Sedation  methylPREDNISolone injection,  ( SoluMEDROL injection )  60 mg, IV push, daily  Indication: Anti-inflammatory Agent/ Immunosuppressant  Fosphenytoin injection, ( CereBYX injection )  100 mg/pe, IV push, q8h  Indication: Seizures, Dilute each 100 mg PE (2 ml) dose in 2 ml D5W or NS and administer at 100 mg PE per minute.  Haloperidol injection,  ( Haldol injection )  2.5 mg, IV push, q4h PRN for agitation  Indication: Psychosis/ Delirium  Allergies:  ASA: GI Distress  Penicillin: Rash  Sulfa drugs: Rash  Codeine: Rash  Flu vaccine: Hives  Vital Signs: **Vital Signs.:   03-Feb-14 00:10   Vital Signs Type Q 4hr   Temperature Temperature (F) 96.5   Celsius 35.8   Temperature Source AdultAxillary   Pulse Pulse 108   Respirations Respirations 18   Systolic BP Systolic BP 053   Diastolic BP (mmHg) Diastolic BP (mmHg) 60   Mean BP 84   Pulse Ox %  Pulse Ox % 84   Pulse Ox Activity Level  At rest   Oxygen Delivery 6L    01:00   Telemetry pattern Cardiac Rhythm Sinus tachycardia; pattern reported by Telemetry Clerk; HR 108    04:30   Telemetry pattern Cardiac Rhythm Sinus tachycardia; pattern reported by Telemetry Clerk; HR 114    04:51   Pulse Ox % Pulse Ox % 93   Pulse Ox Activity Level  At rest   Oxygen Delivery High Flow Nasal Cannula; 90% @ 50 lpm    05:16   Vital Signs Type Routine   Temperature Temperature (F) 99.2   Celsius 37.3   Temperature Source AdultAxillary   Pulse Pulse 113   Respirations Respirations 18   Systolic BP Systolic  BP 785   Diastolic BP (mmHg) Diastolic BP (mmHg) 72   Mean BP 90   Pulse Ox % Pulse Ox % 90   Pulse Ox Activity Level  At rest   Oxygen Delivery High Flow Nasal Cannula    09:10   Vital Signs Type Routine   Temperature Temperature (F) 98.4   Celsius 36.8   Temperature Source axillary   Pulse Pulse 114   Respirations Respirations 18   Systolic BP Systolic BP 885   Diastolic BP (mmHg) Diastolic BP (mmHg) 80   Mean BP 97   BP Source  if not from Vital Sign Device non-invasive   Pulse Ox % Pulse Ox % 96   Pulse Ox Activity Level  At rest   Oxygen Delivery High Flow Nasal Cannula   Telemetry pattern Cardiac Rhythm Sinus tachycardia    10:45   Pulse Ox % Pulse Ox % 92   Oxygen Delivery 6L; Nasal Cannula    10:48   Temperature Temperature (F) 98.5   Celsius 36.9   Temperature Source axillary   Pulse Pulse 027   Systolic BP Systolic BP 741   Diastolic BP (mmHg) Diastolic BP (mmHg) 76   Mean BP 94   Pulse Ox % Pulse Ox % 93   Pulse Ox Activity Level  At rest   Oxygen Delivery High Flow Nasal Cannula    10:50   Pulse Pulse 121   Pulse source if not from Vital Sign Device per Telemetry Clerk; sinus tach    11:31   Vital Signs Type Admission   Temperature Temperature (F) 98.3   Celsius 36.8   Temperature Source axillary   Pulse Pulse 119   Respirations Respirations 22    Systolic BP Systolic BP 287   Diastolic BP (mmHg) Diastolic BP (mmHg) 69   Mean BP 85   Pulse Ox % Pulse Ox % 91   Pulse Ox Activity Level  At rest   Oxygen Delivery Non Rebreather Mask    11:45   Telemetry pattern Cardiac Rhythm Sinus tachycardia    12:00   Vital Signs Type Routine   Pulse Pulse 98   Respirations Respirations 18   Systolic BP Systolic BP 867   Diastolic BP (mmHg) Diastolic BP (mmHg) 67   Mean BP 85   Pulse Ox % Pulse Ox % 98   Pulse Ox Activity Level  At rest   Oxygen Delivery High Flow Nasal Cannula   Pulse Ox Heart Rate 100    12:30   Vital Signs Type Routine   Pulse Pulse 94   Respirations Respirations 36   Systolic BP Systolic BP 672   Diastolic BP (mmHg) Diastolic BP (mmHg) 57   Mean BP 72   Pulse Ox % Pulse Ox % 100   Pulse Ox Activity Level  At rest   Oxygen Delivery High Flow Nasal Cannula   Pulse Ox Heart Rate 94    13:00   Vital Signs Type Routine   Pulse Pulse 94   Respirations Respirations 40   Systolic BP Systolic BP 094   Diastolic BP (mmHg) Diastolic BP (mmHg) 78   Mean BP 104   Pulse Ox % Pulse Ox % 99   Pulse Ox Activity Level  At rest   Oxygen Delivery High Flow Nasal Cannula   Pulse Ox Heart Rate 94    13:30   Vital Signs Type Routine   Pulse Pulse 92   Respirations Respirations 47   Systolic BP Systolic  BP 387   Diastolic BP (mmHg) Diastolic BP (mmHg) 78   Mean BP 102   Pulse Ox % Pulse Ox % 99   Pulse Ox Activity Level  At rest   Oxygen Delivery High Flow Nasal Cannula   Pulse Ox Heart Rate 94    14:00   Vital Signs Type Routine   Pulse Pulse 104   Respirations Respirations 36   Systolic BP Systolic BP 564   Diastolic BP (mmHg) Diastolic BP (mmHg) 71   Mean BP 97   Pulse Ox % Pulse Ox % 96   Pulse Ox Activity Level  At rest   Oxygen Delivery High Flow Nasal Cannula   Pulse Ox Heart Rate 104    14:50   Telemetry pattern Cardiac Rhythm Sinus tachycardia    15:00   Vital Signs Type Routine   Pulse Pulse 112    Respirations Respirations 47   Systolic BP Systolic BP 332   Diastolic BP (mmHg) Diastolic BP (mmHg) 82   Mean BP 95   Pulse Ox % Pulse Ox % 92   Pulse Ox Activity Level  At rest   Oxygen Delivery High Flow Nasal Cannula   Pulse Ox Heart Rate 110    16:00   Vital Signs Type Routine   Pulse Pulse 104   Respirations Respirations 32   Systolic BP Systolic BP 951   Diastolic BP (mmHg) Diastolic BP (mmHg) 65   Mean BP 80   Pulse Ox % Pulse Ox % 93   Pulse Ox Activity Level  At rest   Oxygen Delivery High Flow Nasal Cannula   Pulse Ox Heart Rate 104    16:55   Vital Signs Type Routine   Temperature Temperature (F) 99.5   Celsius 37.5   Temperature Source axillary    16:56   Telemetry pattern Cardiac Rhythm Sinus tachycardia    17:00   Vital Signs Type Routine   Pulse Pulse 112   Respirations Respirations 31   Systolic BP Systolic BP 884   Diastolic BP (mmHg) Diastolic BP (mmHg) 54   Mean BP 77   Pulse Ox % Pulse Ox % 93   Pulse Ox Activity Level  At rest   Oxygen Delivery High Flow Nasal Cannula   Pulse Ox Heart Rate 112    18:00   Vital Signs Type Routine   Pulse Pulse 124   Respirations Respirations 31   Systolic BP Systolic BP 166   Diastolic BP (mmHg) Diastolic BP (mmHg) 48   Mean BP 72   Pulse Ox % Pulse Ox % 87   Pulse Ox Activity Level  At rest   Oxygen Delivery High Flow Nasal Cannula   Pulse Ox Heart Rate 124    19:00   Vital Signs Type Routine   Temperature Source axillary   Pulse Pulse 96   Pulse source if not from Vital Sign Device per Telemetry Clerk; sinus tach   Respirations Respirations 34   Systolic BP Systolic BP 063   Diastolic BP (mmHg) Diastolic BP (mmHg) 53   Mean BP 71   Pulse Ox % Pulse Ox % 87   Pulse Ox Activity Level  At rest   Oxygen Delivery High Flow Nasal Cannula   Pulse Ox Heart Rate 124   Telemetry pattern Cardiac Rhythm Sinus tachycardia    20:00   Vital Signs Type Routine   Temperature Temperature (F) 98.9   Celsius 37.1    Temperature Source axillary   Pulse  Pulse 74   Pulse source if not from Vital Sign Device per cardiac monitor   Respirations Respirations 25   Systolic BP Systolic BP 84   Diastolic BP (mmHg) Diastolic BP (mmHg) 47   Mean BP 59   Pulse Ox % Pulse Ox % 95   Pulse Ox Activity Level  At rest   Oxygen Delivery High Flow Nasal Cannula   Pulse Ox Heart Rate 74    21:00   Temperature Source axillary   Pulse Pulse 98   Pulse source if not from Vital Sign Device per cardiac monitor   Respirations Respirations 26   Systolic BP Systolic BP 97   Diastolic BP (mmHg) Diastolic BP (mmHg) 72   Mean BP 80   Pulse Ox % Pulse Ox % 92   Pulse Ox Activity Level  At rest   Oxygen Delivery High Flow Nasal Cannula   Pulse Ox Heart Rate 98   Lab Results: Thyroid:  03-Feb-14 12:06    Thyroid Stimulating Hormone 1.39 (0.45-4.50 (International Unit)  ----------------------- Pregnant patients have  different reference  ranges for TSH:  - - - - - - - - - -  Pregnant, first trimetser:  0.36 - 2.50 uIU/mL)  Hepatic:  28-Jan-14 16:58    Bilirubin, Total 0.4   Alkaline Phosphatase 121   SGPT (ALT) 21   SGOT (AST) 27   Total Protein, Serum 7.7   Albumin, Serum 3.9  Routine Micro:  28-Jan-14 16:59    Micro Text Report BLOOD CULTURE   ORGANISM 1                COAGULASE NEGATIVE STAPHYLOCOCCUS   COMMENT                   IN AEROBIC BOTTLE ONLY   COMMENT                   POSSIBLE CONTAMINATION W/SKIN FLORA   GRAM STAIN                GRAM POSITIVE COCCI IN CLUSTERS   ANTIBIOTIC                        Culture Comment IN AEROBIC BOTTLE ONLY   Organism Name COAGULASE NEGATIVE STAPHYLOCOCCUS   Organism 1 COAGULASE NEGATIVE STAPHYLOCOCCUS   Culture Comment . POSSIBLE CONTAMINATION W/SKIN FLORA   Gram Stain 1 GRAM POSITIVE COCCI IN CLUSTERS    17:13    Micro Text Report BLOOD CULTURE   COMMENT                   NO GROWTH AEROBICALLY IN 5 DAYS   ANTIBIOTIC                        Culture  Comment NO GROWTH AEROBICALLY IN 5 DAYS  Result(s) reported on 17 Mar 2012 at 09:05AM.  Lab:  28-Jan-14 18:10    pH (ABG)  7.37   PCO2  56   PO2  72   FiO2 32   Base Excess  5.5   HCO3  32.4   O2 Saturation 95.9   O2 Device [place]   Specimen Site (ABG) RT RADIAL   Specimen Type (ABG) ARTERIAL   Patient Temp (ABG) 37.0 (Result(s) reported on 11 Mar 2012 at 06:18PM.)  29-Jan-14 15:09    pH (ABG) 7.41   PCO2  49   PO2  53  FiO2 44   Base Excess  5.3   HCO3  31.1   O2 Saturation 88.5   O2 Device [place]   Specimen Site (ABG) RT RADIAL   Specimen Type (ABG) ARTERIAL   Patient Temp (ABG) 37.0 (Result(s) reported on 12 Mar 2012 at 03:15PM.)  03-Feb-14 10:55    pH (ABG) 7.39   PCO2  66   PO2  49   FiO2 44   Base Excess  12.1   HCO3  40.0   O2 Saturation  82.9   O2 Device [place]NUL[place]   Specimen Site (ABG) RT RADIAL   Specimen Type (ABG) ARTERIAL   Patient Temp (ABG) 37.0 (Result(s) reported on 17 Mar 2012 at 10:59AM.)    13:20    pH (ABG) 7.39   PCO2  67   PO2  127   Base Excess  12.6   HCO3  40.6   O2 Saturation 98.5   O2 Device hfnc   Specimen Site (ABG) RT RADIAL   Specimen Type (ABG) ARTERIAL   Patient Temp (ABG) 37.0 (Result(s) reported on 17 Mar 2012 at 01:27PM.)  Cardiology:  28-Jan-14 16:46    Ventricular Rate 83   Atrial Rate 83   P-R Interval 222   QRS Duration 94   QT 392   QTc 460   P Axis 62   R Axis -52   T Axis 76   ECG interpretation Sinus rhythm with 1st degree A-V block Possible Left atrial enlargement Incomplete right bundle branch block Left anterior fascicular block Abnormal ECG When compared with ECG of 22-Dec-2007 09:11, PR interval has increased ----------unconfirmed---------- Confirmed by OVERREAD, NOT ([id number]), editor PEARSON, BARBARA ([id number]) on 03/12/2012 1:39:01 PM  Routine Chem:  28-Jan-14 16:58    Glucose, Serum  112   BUN 10   Creatinine (comp) 0.73   Sodium, Serum  111   Potassium, Serum 4.1   Chloride, Serum  72   CO2, Serum 30    Calcium (Total), Serum 9.3   Anion Gap 9   Osmolality (calc) 225   eGFR (African American) >60   eGFR (Non-African American) >60 (eGFR values <36m/min/1.73 m2 may be an indication of chronic kidney disease (CKD). Calculated eGFR is useful in patients with stable renal function. The eGFR calculation will not be reliable in acutely ill patients when serum creatinine is changing rapidly. It is not useful in  patients on dialysis. The eGFR calculation may not be applicable to patients at the low and high extremes of body sizes, pregnant women, and vegetarians.)   Result Comment sodium - RESULTS VERIFIED BY REPEAT TESTING.  - NOTIFIED OF CRITICAL VALUE  - c/Kelly Penalton@17 :30 03-11-12 by [place] - READ-BACK PROCESS PERFORMED.  Result(s) reported on 11 Mar 2012 at 05:25PM.   B-Type Natriuretic Peptide ([facility] 114 (Result(s) reported on 11 Mar 2012 at 05:25PM.)    16:59    Result Comment AEROBIC BOTTLE - NOTIFIED OF CRITICAL VALUE  - LAB/BRANDY DAVENPORT AT 1158 03/12/12  - READ-BACK PROCESS PERFORMED.  Result(s) reported on 17 Mar 2012 at 08:44AM.    20:00    Result Comment serum osmo - RESULTS VERIFIED BY REPEAT TESTING.  - NOTIFIED OF CRITICAL VALUE  - cTerrence DupontFMichiel Sites21:56 03-11-12 by [place]  Result(s) reported on 11 Mar 2012 at 09:50PM.   Osmolality, Blood  240    22:36    Sodium, Serum  115   Result Comment sodium - RESULTS VERIFIED BY REPEAT TESTING.  - READ-BACK  PROCESS PERFORMED.  - NOTIFIED OF CRITICAL VALUE  - c/slyvia fuentes 1/28/14at [id number].nbb  Result(s) reported on 11 Mar 2012 at 11:07PM.  29-Jan-14 07:45    Glucose, Serum  127   BUN 9   Creatinine (comp) 0.60   Sodium, Serum  119   Potassium, Serum 4.2   Chloride, Serum  79   CO2, Serum 32   Calcium (Total), Serum 9.3   eGFR (African American) >60   eGFR (Non-African American) >60 (eGFR values <34m/min/1.73 m2 may be an indication of chronic kidney disease (CKD). Calculated eGFR  is useful in patients with stable renal function. The eGFR calculation will not be reliable in acutely ill patients when serum creatinine is changing rapidly. It is not useful in  patients on dialysis. The eGFR calculation may not be applicable to patients at the low and high extremes of body sizes, pregnant women, and vegetarians.)   Result Comment SODIUM - RESULTS VERIFIED BY REPEAT TESTING.  - NOTIFIED OF CRITICAL VALUE  - C/ TRACEY INGLE @0908  03-12-12. [facility]  - READ-BACK PROCESS PERFORMED.  Result(s) reported on 12 Mar 2012 at 08:34AM.  30-Jan-14 10:42    Glucose, Serum  284   BUN 18   Creatinine (comp) 1.06   Sodium, Serum  118   Potassium, Serum 4.4   Chloride, Serum  77   CO2, Serum 32   Calcium (Total), Serum 9.0   Anion Gap 9   Osmolality (calc) 251   eGFR (African American) >60   eGFR (Non-African American)  52 (eGFR values <676mmin/1.73 m2 may be an indication of chronic kidney disease (CKD). Calculated eGFR is useful in patients with stable renal function. The eGFR calculation will not be reliable in acutely ill patients when serum creatinine is changing rapidly. It is not useful in  patients on dialysis. The eGFR calculation may not be applicable to patients at the low and high extremes of body sizes, pregnant women, and vegetarians.)   Result Comment sodium - RESULTS VERIFIED BY REPEAT TESTING.  - NOTIFIED OF CRITICAL VALUE  - glenda fields rn@1C  [id number]  - READ-BACK PROCESS PERFORMED.  Result(s) reported on 13 Mar 2012 at 11:19AM.  31-Jan-14 07:13    Sodium, Serum  120   Result Comment sodium - RESULTS VERIFIED BY REPEAT TESTING.  - READ-BACK PROCESS PERFORMED.  - NOTIFIED OF CRITICAL VALUE  - c/kim smythe 1/31/14at [id number].nbb  Result(s) reported on 14 Mar 2012 at 07:43AM.    14:08    Sodium, Serum  119   Result Comment sodium - RESULTS VERIFIED BY REPEAT TESTING.  - CRITICAL RESULT CALLED TO AND READ BACK  - FROM KIM SMYTHE/03-14-12/1447/SFW.   Result(s) reported on 14 Mar 2012 at 02:46PM.  01-Feb-14 05:17    Glucose, Serum  131   BUN  21   Creatinine (comp) 0.60   Sodium, Serum  122   Potassium, Serum 4.0   Chloride, Serum  81   CO2, Serum 31   Calcium (Total), Serum 9.0   Anion Gap 10   Osmolality (calc) 251   eGFR (African American) >60   eGFR (Non-African American) >60 (eGFR values <6053min/1.73 m2 may be an indication of chronic kidney disease (CKD). Calculated eGFR is useful in patients with stable renal function. The eGFR calculation will not be reliable in acutely ill patients when serum creatinine is changing rapidly. It is not useful in  patients on dialysis. The eGFR calculation may not be applicable to patients at the low and high extremes  of body sizes, pregnant women, and vegetarians.)  02-Feb-14 04:45    Glucose, Serum  107   BUN  29   Creatinine (comp) 0.69   Sodium, Serum  126   Potassium, Serum 3.8   Chloride, Serum  84   CO2, Serum  33   Calcium (Total), Serum 9.2   Anion Gap 9   Osmolality (calc) 260   eGFR (African American) >60   eGFR (Non-African American) >60 (eGFR values <35m/min/1.73 m2 may be an indication of chronic kidney disease (CKD). Calculated eGFR is useful in patients with stable renal function. The eGFR calculation will not be reliable in acutely ill patients when serum creatinine is changing rapidly. It is not useful in  patients on dialysis. The eGFR calculation may not be applicable to patients at the low and high extremes of body sizes, pregnant women, and vegetarians.)  03-Feb-14 04:37    Glucose, Serum  112   BUN  44   Creatinine (comp) 0.90   Sodium, Serum  130   Potassium, Serum 3.9   Chloride, Serum  88   CO2, Serum  34   Calcium (Total), Serum 9.6   Anion Gap 8   Osmolality (calc) 273   eGFR (African American) >60   eGFR (Non-African American) >60 (eGFR values <641mmin/1.73 m2 may be an indication of chronic kidney disease (CKD). Calculated eGFR is  useful in patients with stable renal function. The eGFR calculation will not be reliable in acutely ill patients when serum creatinine is changing rapidly. It is not useful in  patients on dialysis. The eGFR calculation may not be applicable to patients at the low and high extremes of body sizes, pregnant women, and vegetarians.)    12:06    Glucose, Serum  174   BUN  49   Creatinine (comp) 0.90   Sodium, Serum  131   Potassium, Serum 3.9   Chloride, Serum  91   CO2, Serum  34   Calcium (Total), Serum 9.3   Anion Gap  6   Osmolality (calc) 280   eGFR (African American) >60   eGFR (Non-African American) >60 (eGFR values <6056min/1.73 m2 may be an indication of chronic kidney disease (CKD). Calculated eGFR is useful in patients with stable renal function. The eGFR calculation will not be reliable in acutely ill patients when serum creatinine is changing rapidly. It is not useful in  patients on dialysis. The eGFR calculation may not be applicable to patients at the low and high extremes of body sizes, pregnant women, and vegetarians.)  Misc Urine Chem:  28-Jan-14 18:55    Osmolality, Urine Random 86 (50-1400 300-900 mOsm/kg   with avg Fluid Intake > 850 mOsm/kg  with Fluid Restriction)  30-Jan-14 17:29    Osmolality, Urine Random 533 (50-1400 300-900 mOsm/kg   with avg Fluid Intake > 850 mOsm/kg  with Fluid Restriction)   Sodium, Urine Random 9 (Result(s) reported on 13 Mar 2012 at 06:56PM.)  01-Feb-14 18:54    Chloride, Urine Random  15 (Result(s) reported on 15 Mar 2012 at 07:44PM.)   Osmolality, Urine Random 502 (50-1400 300-900 mOsm/kg   with avg Fluid Intake > 850 mOsm/kg  with Fluid Restriction)   Sodium, Urine Random 17 (Result(s) reported on 15 Mar 2012 at 07:44PM.)  Cardiac:  28-Jan-14 16:58    CK, Total 116   CPK-MB, Serum  6.6 (Result(s) reported on 11 Mar 2012 at 05:25PM.)   Troponin I < 0.02 (0.00-0.05 0.05 ng/mL or less: NEGATIVE  Repeat  testing in  3-6 hrs  if clinically indicated. >0.05 ng/mL: POTENTIAL  MYOCARDIAL INJURY. Repeat  testing in 3-6 hrs if  clinically indicated. NOTE: An increase or decrease  of 30% or more on serial  testing suggests a  clinically important change)  Routine UA:  28-Jan-14 17:55    Color (UA) Straw   Clarity (UA) Clear   Glucose (UA) Negative   Bilirubin (UA) Negative   Ketones (UA) Negative   Specific Gravity (UA) 1.003   Blood (UA) Negative   pH (UA) 7.0   Protein (UA) Negative   Nitrite (UA) Negative   Leukocyte Esterase (UA) Negative (Result(s) reported on 11 Mar 2012 at 06:38PM.)   RBC (UA) 1 /HPF   WBC (UA) NONE SEEN   Bacteria (UA) NONE SEEN   Epithelial Cells (UA) <1 /HPF (Result(s) reported on 11 Mar 2012 at 06:38PM.)  Routine Hem:  28-Jan-14 16:58    WBC (CBC) 6.1   RBC (CBC) 4.99   Hemoglobin (CBC) 14.8   Hematocrit (CBC) 43.9   Platelet Count (CBC) 218 (Result(s) reported on 11 Mar 2012 at 05:10PM.)   MCV 88   MCH 29.6   MCHC 33.7   RDW  15.4  29-Jan-14 07:45    WBC (CBC) 6.2   Neutrophil % 94.0   Lymphocyte % 4.0   Monocyte % 1.9   Eosinophil % 0.0   Basophil % 0.1   Neutrophil # 5.8   Lymphocyte #  0.2   Monocyte #  0.1   Eosinophil # 0.0   Basophil # 0.0 (Result(s) reported on 12 Mar 2012 at 08:34AM.)   RBC (CBC) 5.14   Hemoglobin (CBC) 15.2   Hematocrit (CBC) 45.6   Platelet Count (CBC) 242   MCV 89   MCH 29.5   MCHC 33.3   RDW  15.5  01-Feb-14 17:22    WBC (CBC)  13.6 (Result(s) reported on 15 Mar 2012 at 05:35PM.)   Neutrophil % 92.1   Lymphocyte % 1.4   Monocyte % 6.0   Eosinophil % 0.0   Basophil % 0.5   Neutrophil #  13.0   Lymphocyte #  0.2   Monocyte # 0.8   Eosinophil # 0.0   Basophil # 0.1   Reference Accession# [id number] (Result(s) reported on 15 Mar 2012 at 05:57PM.)   Radiology Results: CT:    03-Feb-14 11:42, CT Head Without Contrast   CT Head Without Contrast    REASON FOR EXAM:    unresponsive  COMMENTS:       PROCEDURE:  CT  - CT HEAD WITHOUT CONTRAST  - Mar 17 2012 11:42AM     RESULT: Comparison:  None    Technique: Multiple axial images from the foramen magnum to the vertex   were obtained without IV contrast.    Findings:      There is no evidence of mass effect, midline shift, or extra-axial fluid   collections.  There is no evidence of a space-occupying lesion or   intracranial hemorrhage. There is no evidence of a cortical-based area of     acute infarction. There is generalized cerebral atrophy. There is   periventricular white matter low attenuation likely secondary to   microangiopathy.    The ventricles and sulci are appropriate for the patient's age. The basal   cisterns are patent.    Visualized portions of the orbits are unremarkable. The visualized   portions of the paranasal sinuses and mastoid air cells are unremarkable.  The osseous structures are unremarkable.    IMPRESSION:      No acute intracranial process.  Dictation Site: 1        Verified By: Jennette Banker, M.D., MD   Electronic Signatures: Anabel Bene (MD)  (Signed 03-Feb-14 23:36)  Authored: REFERRING PHYSICIAN, Primary Care Physician, Consult, History of Present Illness, PAST MEDICAL/SURGICAL HISTORY, HOME MEDICATIONS, Current Medications, ALLERGIES, NURSING VITAL SIGNS, LAB RESULTS, RADIOLOGY RESULTS   Last Updated: 03-Feb-14 23:36 by Anabel Bene (MD)

## 2014-06-04 NOTE — Consult Note (Signed)
History of Present Illness:  Reason for Consult Pulmonary nodule with hilar and mediastinal lymphadenopathy.   HPI   Patient is a 74 year old female whose initially admitted on March 11, 2012 with increasing cough, congestion, and hypoxia.  She was also found to have a sodium of 111.  She also had increasing failure to thrive and her daughter had stated at that time if she could no longer care for her at home.  Patient was found to have a MRSA pneumonia.  During the course of her admission, patient had a CT of the chest which revealed a 5 mm left lower lobe pulmonary nodule as well as significant hilar and mediastinal lymphadenopathy.  Currently, she still feels short of breath but is improved since admission.  She has no new neurologic complaints.  She denies any weight loss.  She denies any fevers or night sweats.  She denies any chest pain or hemoptysis.  She has no nausea, vomiting, constipation, or diarrhea.  Patient otherwise feels well and offers no further specific complaints.  PFSH:  Additional Past Medical and Surgical History COPD, hypertension, hyrlipidemia, anxiety, depression, neuropathy.  Social history: Positive tobacco, greater than 50 years.  Still currently using.  Denies alcohol.  Family history: CVA.   Review of Systems:  Performance Status (ECOG) 2   Review of Systems   As per HPI. Otherwise, 10 point system review was negative.   NURSING NOTES: **Vital Signs.:   10-Feb-14 13:40   Vital Signs Type: Routine   Temperature Temperature (F): 97.9   Celsius: 36.6   Temperature Source: oral   Pulse Pulse: 102   Respirations Respirations: 20   Systolic BP Systolic BP: 126   Diastolic BP (mmHg) Diastolic BP (mmHg): 72   Mean BP: 90   Pulse Ox % Pulse Ox %: 92   Oxygen Delivery: 4L   Physical Exam:  Physical Exam General: ill-appearing, no acute distress. Eyes: Pink conjunctiva, anicteric sclera. Lungs: bilateral basilar crackles. Heart: Regular  rate and rhythm. No rubs, murmurs, or gallops. Abdomen: Soft, normoactive bowel sounds. Musculoskeletal: No edema, cyanosis, or clubbing. Neuro: Alert, answering all questions appropriately. Cranial nerves grossly intact. Skin: No rashes or petechiae noted. Psych: Normal affect. Lymphatics: No cervical or calvicular.    ASA: GI Distress  Penicillin: Rash  Sulfa drugs: Rash  Codeine: Rash  Flu vaccine: Hives    Polytrim 10,000 units-1 mg/mL ophthalmic solution: 1 drop(s) to each affected eye 4 times a day, Status: Active, Quantity: 0, Refills: None   loratadine 10 mg oral tablet: 1 tab(s) orally once a day for congestion and allergies, Status: Active, Quantity: 0, Refills: None   Ventolin HFA 90 mcg/inh inhalation aerosol: 2 puff(s) inhaled every 4 to 6 hours, As Needed- for Wheezing , Status: Active, Quantity: 0, Refills: None   acetaminophen 325 mg oral tablet: 2 tab(s) orally every 6 to 8 hours, As Needed for fever/pains, Status: Active, Quantity: 0, Refills: None   trazodone 100 mg oral tablet: 1 tab(s) orally once a day (at bedtime), Status: Active, Quantity: 0, Refills: None   Zoloft 100 mg oral tablet: 1 tab(s) orally once a day for depression, Status: Active, Quantity: 0, Refills: None   ranitidine 150 mg oral tablet: 1 tab(s) orally 2 times a day for heartburn, Status: Active, Quantity: 0, Refills: None   Advair Diskus 250 mcg-50 mcg inhalation powder: 1 puff(s) inhaled 2 times a day for COPD, Status: Active, Quantity: 0, Refills: None   Spiriva 18 mcg inhalation capsule: 1 each  inhaled once a day for COPD, Status: Active, Quantity: 0, Refills: None   gabapentin 100 mg oral capsule: 1 cap(s) orally once a day, Status: Active, Quantity: 0, Refills: None   amlodipine 2.5 mg oral tablet: 1 tab(s) orally once a day for blood pressure, Status: Active, Quantity: 0, Refills: None   baclofen 10 mg oral tablet: 1 tab(s) orally 2 times a day at 8 am and at 2 pm, Status: Active,  Quantity: 0, Refills: None   simvastatin 20 mg oral tablet: 1 tab(s) orally once a day, Status: Active, Quantity: 0, Refills: None   albuterol 2.5 mg/3 mL (0.083%) inhalation solution: 3 milliliter(s) inhaled every 6 hours, As Needed for cough, Status: Active, Quantity: 0, Refills: None   alprazolam 0.5 mg oral tablet: 1 tab(s) orally 3 times a day for anxiety/nerves, Status: Active, Quantity: 0, Refills: None   Nasonex 50 mcg/inh nasal spray: 2 spray(s) nasal once a day for allergies/sinus congestion, Status: Active, Quantity: 0, Refills: None   Westcort 0.2% topical ointment: Apply topically to right ear 2 times a day until healed for dermatitis, Status: Active, Quantity: 0, Refills: None   triamcinolone 0.1% topical cream: Apply topically to affected area 2 times a day, Status: Active, Quantity: 0, Refills: None  Laboratory Results: Routine Hem:  10-Feb-14 12:35   WBC (CBC)  13.6  RBC (CBC) 4.39  Hemoglobin (CBC) 12.5  Hematocrit (CBC) 39.1  Platelet Count (CBC) 195  MCV 89  MCH 28.4  MCHC  31.9  RDW  16.2  Neutrophil % 93.2  Lymphocyte % 3.5  Monocyte % 3.0  Eosinophil % 0.3  Basophil % 0.0  Neutrophil #  12.6  Lymphocyte #  0.5  Monocyte # 0.4  Eosinophil # 0.0  Basophil # 0.0 (Result(s) reported on 24 Mar 2012 at 12:54PM.)   Radiology Results: CT:    05-Feb-14 11:13, CT Chest for Pulm Embolism With Contrast  CT Chest for Pulm Embolism With Contrast   REASON FOR EXAM:    Hypoxemia w/ high O2 requirements.  hx of COPD  COMMENTS:       PROCEDURE: CT  - CT CHEST (FOR PE) W  - Mar 19 2012 11:13AM     RESULT: Axial CT scanning was performed through the chest with   reconstructions at 3 mm intervals andslice thicknesses. Review of   multiplanar reconstructed images was performed separately on the VIA   monitor. The patient received 75 cc of Isovue-370.    There are dense infiltrates in both lower lobes posteriorly, greater on   the left than on theright. There is no  significant pleural fluid   collection. Mildly increased interstitial densities are noted elsewhere   in both lungs. Subcentimeter nodules are present in both lungs. The   cardiac chambers are normal in size. The caliber of the thoracic aorta is     normal. There is mural thrombus. There is enlargement of a AP window   lymph node to approximately 2.2 cm. There is right hilar fullness with a   lymph node measuring approximately 1.7 cm in diameter. There is likely   left infrahilar lymphadenopathy.    Within the upper abdomen the observed portions of the liver and spleen   exhibit no acute abnormalities. There is an adrenal mass on the left   measuring 16 mm.    IMPRESSION:   1. There is dense consolidation within the lower lobes of both lungs   posteriorly consistent with pneumonia. This is superimposed upon findings  of COPD.  2. There are nodules within both lungs that are of varying sizes. Some of   this may reflect patchy interstitial infiltrate but a discrete nodule in     the left lower lobe on image 62 measures approximately 5 mm in diameter.  3. There is enlargement of mediastinal and hilar lymph nodes. The cardiac   chambers are enlarged.  4. There is a left adrenal mass.     In the appropriate clinical setting the findings could reflect   malignancy.     Dictation Site: 1        Verified By: DAVID A. SwazilandJORDAN, M.D., MD   Assessment and Plan: Impression:   5 mm pulmonary nodule with significant hilar and mediastinal lymphadenopathy. Plan:   1.  Pulmonary nodule: Too small to characterize at this point, but given her extensive smoking history recommend repeating CT scan in 3 months.  A PET scan would not be helpful given her ongoing MRSA pneumonia.  Her hilar and mediastinal lymphadenopathy is most likely infectious.  No intervention is needed at this time.  I do not recommend a biopsy at this time.  Upon discharge, ensure the patient has followup with her primary care  physician who can then order a CT scan of her chest to assess for interval change of the nodule.  If the nodule increases in size, she can then be referred to the [facility]. consult, call with questions.  Electronic Signatures: Gerarda FractionFinnegan, Peighton Edgin (MD)  (Signed 10-Feb-14 17:30)  Authored: HISTORY OF PRESENT ILLNESS, PFSH, ROS, NURSING NOTES, PE, ALLERGIES, HOME MEDICATIONS, LABS, OTHER RESULTS, ASSESSMENT AND PLAN   Last Updated: 10-Feb-14 17:30 by Gerarda FractionFinnegan, Alianys Chacko (MD)

## 2014-06-04 NOTE — H&P (Signed)
PATIENT NAME:  Candace Woods, Candace M MR#:  [id number] DATE OF BIRTH:  1940/04/01  DATE OF ADMISSION:  03/11/2012  PRIMARY CARE PHYSICIAN: Dr. Shawn StallHollingsworth of [facility].   CHIEF COMPLAINT: Shortness of breath, cough, wheezing.   HISTORY OF PRESENT ILLNESS: This is a 74 year old female who was brought from the [facility] with cough, congestion and hypoxia with room air O2 sats in the high 70s. The patient was also quite lethargic and has audible wheezes. She has a history of underlying COPD. The patient apparently was being taken care of by her husband, who recently passed away a few days back. She has now been staying with her daughter, who says that she is not able to handle her at home given her chronic medical problems. The patient's daughter says that she walks only a few feet and her legs buckle and she falls to the ground and she also gets significantly short of breath. She therefore brought her to the ER for further evaluation. In the Emergency Room, the patient was noted to be hypoxic in triage with room air O2 sats in the mid 70s. She was placed on some oxygen and noted to have some audible wheezing. Her oxygenation since then has improved. On lab work, she was also noted to have a sodium level of 111. Hospitalist services were contacted for further treatment and evaluation.   REVIEW OF SYSTEMS:  CONSTITUTIONAL: No documented fever. No weight gain. No weight loss. Positive generalized weakness.  EYES: No blurred or double vision.  ENT: No tinnitus. No postnasal drip. No redness of the oropharynx.  RESPIRATORY: Positive cough. Positive wheeze. No hemoptysis. Positive dyspnea. Positive COPD.  CARDIOVASCULAR: No chest pain, no orthopnea, no palpitations, no syncope.  GASTROINTESTINAL: No nausea, no vomiting, no diarrhea, no abdominal pain, no melena, no hematochezia.  GENITOURINARY: No dysuria, no hematuria.  ENDOCRINE: No polyuria or nocturia. No heat or cold intolerance.  HEMATOLOGIC:  No anemia, no bruising, no bleeding.  INTEGUMENTARY: No rashes, no lesions.  MUSCULOSKELETAL: No arthritis, no swelling, no gout.  NEUROLOGIC: No numbness or tingling. No ataxia, no seizure-type activity.  PSYCHIATRIC: Positive anxiety. No insomnia, no ADD.   PAST MEDICAL HISTORY: Consistent with COPD with ongoing tobacco abuse, hypertension, hyperlipidemia, anxiety/depression, history of neuropathy.   ALLERGIES: ASPIRIN WHICH CAUSES GI DISTRESS, CODEINE WHICH CAUSES A RASH, FLU VACCINE WHICH CAUSES HIVES, PENICILLIN CAUSES RASH AND SULFA DRUGS CAUSE A RASH.   SOCIAL HISTORY: Does have a long smoking history, has been smoking for the past 50+ years. No alcohol abuse. No illicit drug abuse. Lives at home with her daughter now.   FAMILY HISTORY: Mother died from a stroke with a cerebral aneurysm. She cannot recall what her father died from.   CURRENT MEDICATIONS: Tylenol 650 q.6-8 hours as needed, Advair 250/50 one puff b.i.d., albuterol nebulizer q.6 hours as needed, Xanax 0.5 mg t.i.d. amlodipine 2.5 mg daily, baclofen 10 mg b.i.d., gabapentin 100 mg daily, loratadine 10 mg daily, Nasonex 50 mcg 2 sprays daily, ranitidine 150 mg b.i.d., simvastatin 20 mg daily, Spiriva 1 puff daily, trazodone 100 mg daily, triamcinolone 0.1% topical cream b.i.d., Ventolin inhaler 2 puffs q.4-6 hours as needed, Westcort topical ointment to the dermatitis area b.i.d. as needed and Zoloft 100 mg daily.   PHYSICAL EXAMINATION ON ADMISSION:  VITAL SIGNS: Temperature 99.9, pulse 98, respiration 22, blood pressure 144/91, sats 93% on 4 liters nasal cannula.  GENERAL: She is a lethargic-appearing female lying in bed but in no apparent distress.  HEENT: Atraumatic, normocephalic. Her extraocular muscles are intact. Her pupils equal, round, reactive to light. Sclerae are anicteric. No conjunctival injection. No pharyngeal erythema.  NECK: She has torticollis with her neck deviated to the left. No jugular venous distention,  no bruits, no lymphadenopathy.  HEART: Regular rate and rhythm. No murmurs, rubs or clicks.  LUNGS: She has some coarse wheezing and rhonchi diffusely. Good air entry. Negative use of accessory muscle. No dullness to percussion.  ABDOMEN: Soft, slightly distended. Hypoactive bowel sounds. No hepatosplenomegaly appreciated.  EXTREMITIES: No evidence of any cyanosis, clubbing or peripheral edema. Has +2 pedal and radial pulses bilaterally.  NEUROLOGIC: She is alert, awake and oriented x 3. Globally weak. Difficult to do a full neurological exam. Moves all extremities spontaneously.  SKIN: Moist and warm with no rashes.  LYMPHATIC: There is no cervical or axillary lymphadenopathy.  LABORATORY AND RADIOLOGIC DATA: Serum glucose 112, BUN 10, creatinine 0.7, sodium 111, potassium 4.1, chloride 72, bicarbonate 30. The patient's LFTs are within normal limits. Her troponin is less than 0.02. White cell count 6.1, hemoglobin 14.8, hematocrit 43.9, platelet count 218. ABG showed a pH of 7.37, pCO2 of 56, pO2 of 72, sats of 95%.   The patient did have a chest x-ray done, the results of which are officially pending, but did not show any evidence of acute focal pneumonia.   ASSESSMENT AND PLAN: This is a 74 year old female with history of chronic obstructive pulmonary disease, hypertension, chronic torticollis, anxiety/depression, hyperlipidemia, neuropathy, gastroesophageal reflux disease, presented to the hospital due to shortness of breath, wheezing and hypoxia and noted to have a sodium of 111.  1.  Chronic obstructive pulmonary disease exacerbation: This is likely a result of underlying bronchitis complicated with ongoing tobacco abuse. I will place the patient now on intravenous Solu-Medrol 60 every 6 hours, place her on  around-the-clock nebulizer treatments, continue her Advair, continue her Spiriva. Will add Levaquin empirically. Her chest x-ray does not show any evidence of acute pneumonia. Assess her for  home oxygen prior to discharge and follow her clinically.  2.  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 I will place a Foley, follow her urine output. Will get a serum and urine osmolality and follow them up. Will get a nephrology consult. Also will follow her sodium closely.  3.  Hypertension: I will continue her Norvasc.  4.  Gastroesophageal reflux disease: Continue ranitidine.  5.  Anxiety: Continue Xanax p.r.n.   6.  Hyperlipidemia: Continue simvastatin. 7.  Neuropathy: Continue Neurontin, continue baclofen.  8.  As per the daughter, the patient has multiple comorbidities and she cannot take care of her and would like placement. She belongs to the [facility], who she has been trying to work with to get her placed somewhere. I will get a case management called consult to help assist with this. Will also get a physical therapy consult.   CODE STATUS: The patient is a full code.   TIME SPENT ON ADMISSION: 55 minutes.   ____________________________ Rolly Pancake. Cherlynn Kaiser, MD [id number] D: 03/11/2012 19:09:18 ET T: 03/11/2012 19:53:14 ET JOB#: [id number]  cc: Rolly Pancake. Cherlynn Kaiser, MD, <Dictator> Houston Siren MD ELECTRONICALLY SIGNED 03/12/2012 15:35

## 2014-06-04 NOTE — Consult Note (Signed)
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   Patient was seen by consultant.   Consult note dictated.   Recommend further assessment or treatment.   Orders entered.   Comments: Psychiatry: Patient seen. Patient has a history of depression and psychosis. Has bee on Zoloft and Trazodone. Presents here with polydipsia. Just lost her husband - very high stress.  Polydipsia very common problem in patients with chronic mental health problems. Meds may be part of the cause though the behavior often just happens for no clear reason. I will cut down zoloft and agree with holding trazodone and will aim to dc zoloft in a week or less, but meanwhile patient needs close monitering for water intake as there is no particular medication treatment for polydipsia behavior.  Electronic Signatures: Audery Amellapacs, Carita Sollars T (MD)  (Signed 30-Jan-14 18:13)  Authored: Brief Consult Note   Last Updated: 30-Jan-14 18:13 by Audery Amellapacs, Chaquita Basques T (MD)

## 2014-06-04 NOTE — Consult Note (Signed)
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 Today I found the patient alone but awake and interactive. Affect was more tearful. Thoughts appeared confused. She had a hard time maintaining any sort of rational conversation. She made multiple statements that did not appear to have any context with what I was talking about. Some of her affect and speech displayed some paranoia.  There is still no specific treatment for pathological water intoxication other than water restriction. It appears that her sodium is gradually starting to come up. Psychiatrically I noticed that she has more symptoms of psychosis today which from looking at her old records has been a recurrent problem in the past. Most antipsychotics have some relationship with increasing the risk of water intoxication. There is a small amount of mostly anecdotal data about clozapine actually being helpful for hyponatremia and water intoxication. Given that the patient will be in a controlled setting in the hospital and also after hospitalization I think clozapine would be if feasible option. I spoke to the patient's daughter and explained the risk of agranulocytosis and the need for blood checks. The daughter was agreeable. I have started the patient on 25 mg of clozapine at night. Will followup .   Electronic Signatures: Audery Amellapacs, Bekah Igoe T (MD)  (Signed 01-Feb-14 16:31)  Authored: Details   Last Updated: 01-Feb-14 16:31 by Audery Amellapacs, Abbeygail Igoe T (MD)

## 2014-06-04 NOTE — Discharge Summary (Signed)
PATIENT NAME:  Candace Woods, [name] MR#:  [id number] DATE OF BIRTH:  1940-04-21  DATE OF ADMISSION:  09/05/2012 DATE OF DISCHARGE:  09/12/2012   Addendum  The patient's discharge cancelled on the 31st because the patient had hypoxia. The patient's O2 saturations were about 88 on room air, went up to 93 on 2 liters. The patient's chest x-ray was repeated. Chest x-ray showed consistent with COPD and some fibrosis and atelectasis on the left side. The patient is already on Augmentin. The patient is afebrile. O2 saturations are 90 to 93 on 2 liters. The patient says that she uses oxygen even before she came in whenever she needs it. So, we are going to let her go with 2 liters of oxygen all the time, and she is already on nebulizers, steroids and Augmentin that she can continue as per previous dictation. I will add doxycycline 100 twice daily for 7 days because of history of MRSA before, and all the discharge instructions remain the same.   TIME SPENT: Less than 30 minutes today.   ____________________________ Katha HammingSnehalatha Yosgar Demirjian, MD sk:OSi D: 09/12/2012 09:34:35 ET T: 09/12/2012 09:39:54 ET JOB#: [id number]  cc: Katha HammingSnehalatha Leanah Kolander, MD, <Dictator> Katha HammingSNEHALATHA Danuel Felicetti MD ELECTRONICALLY SIGNED 09/24/2012 16:29

## 2014-06-04 NOTE — Consult Note (Signed)
PATIENT NAME:  Candace Woods, Candace Woods MR#:  [id number] DATE OF BIRTH:  [birth year]  DATE OF CONSULTATION:  03/13/2012  REFERRING PHYSICIAN:   CONSULTING PHYSICIAN:  Audery Amel, MD  IDENTIFYING INFORMATION AND REASON FOR CONSULT: The patient is a 74 year old woman admitted to the hospital with hyponatremia. 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   HISTORY OF PRESENT ILLNESS: Information obtained from the patient and from the chart. The patient was admitted to the hospital on 03/11/2012 where she presented with worsening stability, increased weakness and what appeared to be hyponatremia due to polydipsia. The patient tells me today that she does feel thirsty all the time and she is aware that she drinks a great deal of water. She also tells me that she is aware that she has been told that the excessive water drinking has been dangerous for her. The patient is currently feeling very upset and "in shock" because her husband recently died apparently within the last week. She has been sleeping poorly at home. Feels overwhelmed by stress. She denies having any suicidal ideation. She does not report any psychotic symptoms. She indicates that she has been compliant with her medication and that her daughter has been helpful with that. It appears that the patient's husband was her primary caretaker until he died very recently. Since that time the patient has been staying with her daughter. The daughter had reported that the patient drinks very large amount of water throughout the day. The patient is prescribed Zoloft 100 mg per day as well as trazodone 150 mg at night as part of her treatment for depression.   PAST PSYCHIATRIC HISTORY: The patient has a past history of symptoms of depression and psychosis. On previous admission she had been at times paranoid and confused especially regarding her husband during hospitalizations. She had been treated in the past with combinations of antidepressants and  antipsychotics. I do not see that she had ever been admitted to the psychiatric unit. I do not see that there is any history of actual suicide attempts.   PAST MEDICAL HISTORY: The patient has a history of hypoxia, COPD, shortness of breath, hypertension, hyperlipidemia, neuropathy and gastroesophageal reflux.   SOCIAL HISTORY: The patient's husband of long-standing died within the last week. This is an extremely traumatic episode in anyone's life particularly for someone who is otherwise disabled when the deceased was the caretaker. The patient has also been taken care of by her daughter and had a big transition in her own health as a result.   REVIEW OF SYSTEMS: The patient describes feeling in shock. Feels sad and depressed. Not sleeping well. Denies homicidal  or suicidal ideation. Denies hallucinations. She does endorse feeling thirsty and drinking a large amount of water.   MENTAL STATUS EXAMINATION: The patient was sitting up in bed being fed her dinner by an aide when I found her. The patient made eye contact as best she could given her torticollis. Psychomotor activity was otherwise very little. Speech was decreased in total amount. Quiet. Affect flat. Mood stated as being in shock. Thoughts simple but no bizarre content. Denied hallucinations. Denied suicidal or homicidal ideation. Full cognitive testing not done although she was not able to remember any out of 3 words at about 2 minutes. She was completely oriented to the situation and the date. Judgment and insight questionable.   CURRENT MEDICATIONS: Ambien 5 mg at night, Zoloft 100 mg per day, Levaquin 250 mg IV q. 24 hours, Spiriva HandiHaler 1 capsule per day, Zocor  20 mg at night, Zantac 150 mg twice a day, Solu-Medrol 60 mg q. 12 hours, Claritin 10 mg per day, Neurontin 100 mg per day,  Advair Diskus 1 puff twice a day, Lovenox 40 mg subcutaneous per day, baclofen 10 mg 2 times a day and Norvasc 2.5 mg per day.   ALLERGIES: ASPIRIN,  CODEINE, FLU VACCINE, PENICILLIN, SULFA DRUGS.   ASSESSMENT: A 74 year old woman with what appears to be an either fairly new onset or significant worsening of polydipsia with resultant severe hyponatremia. 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 The etiology and pathophysiology are unknown. Medications do seem to be often playing a part. Some medications are clearly a high risk for polydipsia. Multiple psychiatric medicines have been implicated as being possible factors. The Zoloft that she is currently taking has a small but probably significant risk of increasing the likelihood of psychogenic polydipsia. There is no specific psychiatric treatment. There is no medication that has been shown to be clearly beneficial with the possible exception of clozapine whose use is complicated itself.   TREATMENT PLAN: I agree with holding the trazodone as is being done currently and tapering off of the Zoloft. I will decrease the Zoloft to 50 mg a day and anticipate probably discontinuing it within a week. The patient will need to be monitored out into the future for worsening of her psychiatric condition particularly given the severe stress that she is under. At least as likely as the Zoloft is the stress of the death of her husband that might be triggering this new symptom complex. The patient will need close supervision to ensure that she is not drinking excessive water.   DIAGNOSIS PRINCIPLE AND PRIMARY:   AXIS I: Depression, not otherwise specified.   SECONDARY DIAGNOSES:  AXIS I: No further.   AXIS II: Deferred.   AXIS III: Psychogenic polydipsia, hyponatremia, chronic obstructive pulmonary disease.  AXIS IV: Severe from death of husband.   AXIS V: Functioning at time of evaluation 30. ____________________________ Audery AmelJohn T. Meshell Abdulaziz, MD jtc:sb D: 03/13/2012 23:10:14 ET T: 03/14/2012 08:10:42 ET JOB#: [id number]  cc: Audery AmelJohn T. Draya Felker, MD, <Dictator> Audery AmelJOHN T  Adrienna Karis MD ELECTRONICALLY SIGNED 03/14/2012 10:03

## 2014-06-04 NOTE — Discharge Summary (Signed)
PATIENT NAME:  Candace Woods, Candace Woods MR#:  [id number] DATE OF BIRTH:  [birth year]  DATE OF ADMISSION:  03/11/2012 DATE OF DISCHARGE:  03/27/2012   ADMITTING PHYSICIAN: Rolly PancakeVivek J. Cherlynn KaiserSainani, MD  DISCHARGING PHYSICIAN: Enid Baasadhika Makisha Marrin, MD  PRIMARY CARE PHYSICIAN: Dr. Shawn StallHollingsworth, [facility].  For more details, please also look at the history and physical done by Dr. Cherlynn KaiserSainani on admission on 03/11/2012 and also please look at the interim summary done by Dr. Cherlynn KaiserSainani on 03/20/2012.  FINAL DIAGNOSES:  1.  Acute on chronic respiratory failure secondary to chronic obstructive pulmonary disease exacerbation.  2.  Multilobar methicillin resistant Staphylococcus aureus pneumonia.  3.  Status epilepticus.  4.  Seizure disorder.  5.  Psychogenic polydipsia.  6.  Anxiety, depression.  7.  Hyponatremia.  8.  Right neck torticollis.  9.  Pulmonary nodule.  10.  Hypertension.  11.  Hyperlipidemia.  12.  Peripheral neuropathy.  13.  Urinary retention.   DISCHARGE MEDICATIONS:  1.  Loratadine 10 mg p.o. daily.  2.  Ventolin 2 puffs q.4-6 hours p.r.n. for wheezing.  3.  Tylenol 650 mg q.6-8 hours as needed for fever and pain.  4.  Ranitidine 150 mg p.o. b.i.d.  5.  Advair 250/50, 1 puff b.i.d.  6.  Spiriva 18 mcg inhalation capsule daily.  7.  Gabapentin 100 mg p.o. daily.  8.  Baclofen 10 mg twice a day at 8:00 a.Woods. and 2:00 p.Woods.  9.  Simvastatin 20 mg p.o. daily.  10.  Albuterol nebulizer 3 mL every 6 hours as needed for wheezing.  11.  Nasonex inhaler 2 sprays once a day.  12.  Westcort topical ointment 0.2% to the right ear for dermatitis as needed.  13.  Sertraline 50 mg at bedtime.  14.  Xanax 0.5 mg q.8 hours p.r.n. for anxiety.  15.  Magnesium hydroxide 30 mL p.r.n. at bedtime for constipation.  16.  Phenytoin 300 mg p.o. at bedtime.  17.  Nystatin suspension 5 mL orally q.6 hours for 5 more days.  18.  Clozapine 25 mg at bedtime.  19.  Robitussin-DM 5 mL q.6 hours p.r.n. for cough.  20.   Budesonide 2 mL inhaler twice a day.  21.  Linezolid 600 mg p.o. b.i.d. until 04/04/2012.  22.  Benzonatate 100 mg capsule q.6 hours p.r.n. for cough.  23.  Prednisone taper 40 mg p.o. daily and taper off by 10 mg every day.   DISCHARGE DIET: Low-sodium pureed diet with aspiration precautions.   DISCHARGE HOME OXYGEN: 3 liters.  FOLLOWUP:  1.  PCP followup in 1 week.  2.  Physical therapy.  DISCHARGE INSTRUCTIONS: Continue Foley catheter. Trial of voiding after 48 hours.  LABORATORY AND IMAGING STUDIES: WBC 7.2, hemoglobin 12.8, hematocrit 37.2, platelet count 202. Last chest x-ray 03/26/2012 showing COPD with superimposed left lower lobe pneumonia and left pleural effusion. Sodium 136, potassium 3.4, chloride 99, bicarbonate 30, BUN 13, creatinine 0.7, glucose 102, calcium 8.7.   BRIEF HOSPITAL COURSE: For more details, look at interim summary done by Dr. Hilda LiasVivek Sainani on 03/20/2012. The patient is a 74 year old female with multiple medical problems, admitted initially for hypoxic respiratory failure secondary to COPD exacerbation.  1.  Acute on chronic hypoxic respiratory failure secondary to COPD exacerbation and bibasilar pneumonia seen on the chest x-ray: The patient was treated with IV steroids, inhalers, nebulizers and empirically initially started on Levaquin. Sputum cultures were growing MRSA, so she was changed over to vancomycin and then changed to linezolid for outpatient  continuation.  Currently, the patient is on a steroid taper. She is breathing better. She was on high-flow nasal cannula, changed over to 4 liters and currently at 2.5 to 3 liters oxygen, She can be weaned over as an outpatient and continue antibiotics, steroid taper and inhalers.  2.  Status epilepticus: Questionable history of seizure in the past. She had severe status epilepticus and transferred over to ICU, started on IV Dilantin. CT of the head was negative. EEG did not show any acute epilepsy. Neurology has  been consulted and she is started on p.o. Dilantin at this time.  3.  Acute hyponatremia: Probably secondary to psychogenic polydipsia. Sodium was as low as 111 and has normalized with fluid restriction diet at this time. Nephrology was consulted on admission.  4.  Hypertension: Hemodynamically stable at this point.  5.  Anxiety and depression. Psychiatric was consulted and she is being started on the above-mentioned discharge medications.  6.  Neuropathy: Continue Neurontin at this time. Her course has been otherwise uneventful in the hospital. She will be working with physical therapy who recommended skilled nursing facility at this time.  7.  Urinary retention: The patient has been having trouble with urinary retention since admission. In-and-out catheterizations have been done. No acute renal disease identified at this time. This can be followed up as an outpatient, so she will be discharged with a Foley catheter at this time. Voiding trial needs to be done as an outpatient and probably renal ultrasound and further urology followup if does not improve.  8.  Pulmonary nodule seen on the CT chest: Was seen by Dr. Orlie Dakin in the hospital and recommended outpatient 46-month followup CT scan to see any increase. If further increase is noted, outpatient oncology followup is recommended.   TIME SPENT ON DISCHARGE: 35 Minutes.   DISCHARGE CONDITION: Guarded.   DISCHARGE DISPOSITION: [facility].   CODE STATUS: Limited code while in the hospital, but patient wanted to be full code as discussed at the time of discharge so she is full code at this time.    ____________________________ Enid Baas, MD rk:jm D: 03/27/2012 12:52:26 ET T: 03/27/2012 13:39:49 ET JOB#: [id number]  cc: Enid Baas, MD, <Dictator> Enid Baas MD ELECTRONICALLY SIGNED 04/09/2012 15:58

## 2014-06-04 NOTE — Consult Note (Signed)
Details:    - Psychiatry: Followup on this patient with recent hyponatremia. She was recently in the critical care unit but has now been transferred to the floor. When I came to see her she was awake and alert. She made good eye contact . Speech was much more understandable and coherent than the last time I saw her . Thoughts appear to be lucid. She was able to recount recent events and admits that she has been through very hard week with her husband dying. Totally denied any suicidal ideation. Totally denied feeling like she needs to excessively drink water. She tells me today that she is planning to go back and live with her daughter at discharge. I do not know if this is really the plan. If she is going to live with her daughter we might want to consider discontinuing the clozapine because it will probably be him practical to get the blood draws done. If she is going to an assisted living facility she will need to have arrangements made to get the blood draws done weekly and the prescriber needs to realize that she is taking clozapine. I would not increase the dose but would recommend staying with the 25 mg for the time being. If she continues to be free of psychotic symptoms and behavior he could probably be discontinued within the next month or so.   Electronic Signatures: Audery Amellapacs, Briceyda Abdullah T (MD)  (Signed 07-Feb-14 17:35)  Authored: Details   Last Updated: 07-Feb-14 17:35 by Audery Amellapacs, Cebastian Neis T (MD)

## 2014-10-26 ENCOUNTER — Other Ambulatory Visit: Payer: Self-pay | Admitting: Family

## 2014-10-26 DIAGNOSIS — M79605 Pain in left leg: Secondary | ICD-10-CM

## 2014-10-26 DIAGNOSIS — M79604 Pain in right leg: Secondary | ICD-10-CM

## 2014-10-26 DIAGNOSIS — R600 Localized edema: Secondary | ICD-10-CM

## 2015-01-19 ENCOUNTER — Other Ambulatory Visit: Payer: Self-pay | Admitting: Family

## 2015-01-19 DIAGNOSIS — Z1231 Encounter for screening mammogram for malignant neoplasm of breast: Secondary | ICD-10-CM

## 2015-03-08 ENCOUNTER — Ambulatory Visit: Payer: Self-pay | Attending: Family

## 2015-05-22 ENCOUNTER — Emergency Department: Payer: Medicare (Managed Care)

## 2015-05-22 ENCOUNTER — Inpatient Hospital Stay
Admission: EM | Admit: 2015-05-22 | Discharge: 2015-06-07 | DRG: 189 | Disposition: A | Discharge status: Skilled Nursing Facility | Payer: Medicare (Managed Care) | Attending: Specialist | Admitting: Specialist

## 2015-05-22 ENCOUNTER — Inpatient Hospital Stay: Payer: Medicare (Managed Care)

## 2015-05-22 ENCOUNTER — Encounter: Payer: Self-pay | Admitting: Emergency Medicine

## 2015-05-22 DIAGNOSIS — F17210 Nicotine dependence, cigarettes, uncomplicated: Secondary | ICD-10-CM | POA: Diagnosis present

## 2015-05-22 DIAGNOSIS — Z885 Allergy status to narcotic agent status: Secondary | ICD-10-CM

## 2015-05-22 DIAGNOSIS — R627 Adult failure to thrive: Secondary | ICD-10-CM | POA: Diagnosis present

## 2015-05-22 DIAGNOSIS — Z880 Allergy status to penicillin: Secondary | ICD-10-CM

## 2015-05-22 DIAGNOSIS — J101 Influenza due to other identified influenza virus with other respiratory manifestations: Secondary | ICD-10-CM | POA: Diagnosis not present

## 2015-05-22 DIAGNOSIS — Z886 Allergy status to analgesic agent status: Secondary | ICD-10-CM

## 2015-05-22 DIAGNOSIS — F54 Psychological and behavioral factors associated with disorders or diseases classified elsewhere: Secondary | ICD-10-CM

## 2015-05-22 DIAGNOSIS — E46 Unspecified protein-calorie malnutrition: Secondary | ICD-10-CM | POA: Diagnosis present

## 2015-05-22 DIAGNOSIS — R600 Localized edema: Secondary | ICD-10-CM | POA: Diagnosis present

## 2015-05-22 DIAGNOSIS — Z91041 Radiographic dye allergy status: Secondary | ICD-10-CM | POA: Diagnosis not present

## 2015-05-22 DIAGNOSIS — R0689 Other abnormalities of breathing: Secondary | ICD-10-CM | POA: Diagnosis not present

## 2015-05-22 DIAGNOSIS — R339 Retention of urine, unspecified: Secondary | ICD-10-CM | POA: Diagnosis present

## 2015-05-22 DIAGNOSIS — F05 Delirium due to known physiological condition: Secondary | ICD-10-CM | POA: Diagnosis not present

## 2015-05-22 DIAGNOSIS — J9622 Acute and chronic respiratory failure with hypercapnia: Secondary | ICD-10-CM | POA: Diagnosis present

## 2015-05-22 DIAGNOSIS — T380X5A Adverse effect of glucocorticoids and synthetic analogues, initial encounter: Secondary | ICD-10-CM | POA: Diagnosis not present

## 2015-05-22 DIAGNOSIS — R410 Disorientation, unspecified: Secondary | ICD-10-CM | POA: Diagnosis not present

## 2015-05-22 DIAGNOSIS — I480 Paroxysmal atrial fibrillation: Secondary | ICD-10-CM | POA: Diagnosis not present

## 2015-05-22 DIAGNOSIS — E871 Hypo-osmolality and hyponatremia: Secondary | ICD-10-CM | POA: Diagnosis present

## 2015-05-22 DIAGNOSIS — G92 Toxic encephalopathy: Secondary | ICD-10-CM | POA: Diagnosis not present

## 2015-05-22 DIAGNOSIS — G40909 Epilepsy, unspecified, not intractable, without status epilepticus: Secondary | ICD-10-CM | POA: Diagnosis present

## 2015-05-22 DIAGNOSIS — E860 Dehydration: Secondary | ICD-10-CM | POA: Diagnosis present

## 2015-05-22 DIAGNOSIS — R631 Polydipsia: Secondary | ICD-10-CM

## 2015-05-22 DIAGNOSIS — J441 Chronic obstructive pulmonary disease with (acute) exacerbation: Secondary | ICD-10-CM | POA: Diagnosis present

## 2015-05-22 DIAGNOSIS — Z720 Tobacco use: Secondary | ICD-10-CM

## 2015-05-22 DIAGNOSIS — E876 Hypokalemia: Secondary | ICD-10-CM | POA: Diagnosis present

## 2015-05-22 DIAGNOSIS — R059 Cough, unspecified: Secondary | ICD-10-CM

## 2015-05-22 DIAGNOSIS — Y92239 Unspecified place in hospital as the place of occurrence of the external cause: Secondary | ICD-10-CM | POA: Diagnosis not present

## 2015-05-22 DIAGNOSIS — R569 Unspecified convulsions: Secondary | ICD-10-CM

## 2015-05-22 DIAGNOSIS — R609 Edema, unspecified: Secondary | ICD-10-CM

## 2015-05-22 DIAGNOSIS — N390 Urinary tract infection, site not specified: Secondary | ICD-10-CM | POA: Diagnosis present

## 2015-05-22 DIAGNOSIS — F29 Unspecified psychosis not due to a substance or known physiological condition: Secondary | ICD-10-CM

## 2015-05-22 DIAGNOSIS — W010XXA Fall on same level from slipping, tripping and stumbling without subsequent striking against object, initial encounter: Secondary | ICD-10-CM | POA: Diagnosis present

## 2015-05-22 DIAGNOSIS — I509 Heart failure, unspecified: Secondary | ICD-10-CM

## 2015-05-22 DIAGNOSIS — Y92009 Unspecified place in unspecified non-institutional (private) residence as the place of occurrence of the external cause: Secondary | ICD-10-CM | POA: Diagnosis not present

## 2015-05-22 DIAGNOSIS — Z882 Allergy status to sulfonamides status: Secondary | ICD-10-CM

## 2015-05-22 DIAGNOSIS — I10 Essential (primary) hypertension: Secondary | ICD-10-CM | POA: Diagnosis present

## 2015-05-22 DIAGNOSIS — W19XXXA Unspecified fall, initial encounter: Secondary | ICD-10-CM

## 2015-05-22 DIAGNOSIS — F329 Major depressive disorder, single episode, unspecified: Secondary | ICD-10-CM | POA: Diagnosis present

## 2015-05-22 DIAGNOSIS — B9620 Unspecified Escherichia coli [E. coli] as the cause of diseases classified elsewhere: Secondary | ICD-10-CM | POA: Diagnosis present

## 2015-05-22 DIAGNOSIS — E785 Hyperlipidemia, unspecified: Secondary | ICD-10-CM | POA: Diagnosis present

## 2015-05-22 DIAGNOSIS — Z6827 Body mass index (BMI) 27.0-27.9, adult: Secondary | ICD-10-CM

## 2015-05-22 DIAGNOSIS — Z9181 History of falling: Secondary | ICD-10-CM

## 2015-05-22 DIAGNOSIS — R05 Cough: Secondary | ICD-10-CM

## 2015-05-22 DIAGNOSIS — Z79899 Other long term (current) drug therapy: Secondary | ICD-10-CM | POA: Diagnosis not present

## 2015-05-22 DIAGNOSIS — Z8249 Family history of ischemic heart disease and other diseases of the circulatory system: Secondary | ICD-10-CM | POA: Diagnosis not present

## 2015-05-22 DIAGNOSIS — Z452 Encounter for adjustment and management of vascular access device: Secondary | ICD-10-CM

## 2015-05-22 DIAGNOSIS — J9621 Acute and chronic respiratory failure with hypoxia: Secondary | ICD-10-CM | POA: Diagnosis present

## 2015-05-22 DIAGNOSIS — R0902 Hypoxemia: Secondary | ICD-10-CM

## 2015-05-22 DIAGNOSIS — J9690 Respiratory failure, unspecified, unspecified whether with hypoxia or hypercapnia: Secondary | ICD-10-CM

## 2015-05-22 HISTORY — DX: Psychological and behavioral factors associated with disorders or diseases classified elsewhere: F54

## 2015-05-22 HISTORY — DX: Unspecified asthma, uncomplicated: J45.909

## 2015-05-22 HISTORY — DX: Chronic obstructive pulmonary disease, unspecified: J44.9

## 2015-05-22 HISTORY — DX: Depression, unspecified: F32.A

## 2015-05-22 HISTORY — DX: Pneumonia due to methicillin resistant Staphylococcus aureus: J15.212

## 2015-05-22 HISTORY — DX: Anxiety disorder, unspecified: F41.9

## 2015-05-22 HISTORY — DX: Hyperlipidemia, unspecified: E78.5

## 2015-05-22 HISTORY — DX: Torticollis: M43.6

## 2015-05-22 HISTORY — DX: Polyneuropathy, unspecified: G62.9

## 2015-05-22 HISTORY — DX: Major depressive disorder, single episode, unspecified: F32.9

## 2015-05-22 HISTORY — DX: Unspecified convulsions: R56.9

## 2015-05-22 HISTORY — DX: Polydipsia: R63.1

## 2015-05-22 HISTORY — DX: Essential (primary) hypertension: I10

## 2015-05-22 LAB — BLOOD GAS, VENOUS
ACID-BASE EXCESS: 15.9 mmol/L — AB (ref 0.0–3.0)
BICARBONATE: 46 meq/L — AB (ref 21.0–28.0)
PATIENT TEMPERATURE: 37
PCO2 VEN: 76 mmHg — AB (ref 44.0–60.0)
pH, Ven: 7.39 (ref 7.320–7.430)

## 2015-05-22 LAB — CBC
HEMATOCRIT: 50.1 % — AB (ref 35.0–47.0)
Hemoglobin: 17 g/dL — ABNORMAL HIGH (ref 12.0–16.0)
MCH: 29.4 pg (ref 26.0–34.0)
MCHC: 34 g/dL (ref 32.0–36.0)
MCV: 86.6 fL (ref 80.0–100.0)
PLATELETS: 189 10*3/uL (ref 150–440)
RBC: 5.79 MIL/uL — AB (ref 3.80–5.20)
RDW: 15.5 % — ABNORMAL HIGH (ref 11.5–14.5)
WBC: 9.3 10*3/uL (ref 3.6–11.0)

## 2015-05-22 LAB — URINALYSIS COMPLETE WITH MICROSCOPIC (ARMC ONLY)
Bilirubin Urine: NEGATIVE
Glucose, UA: NEGATIVE mg/dL
KETONES UR: NEGATIVE mg/dL
NITRITE: NEGATIVE
PROTEIN: 30 mg/dL — AB
RBC / HPF: NONE SEEN RBC/hpf (ref 0–5)
SPECIFIC GRAVITY, URINE: 1.009 (ref 1.005–1.030)
pH: 6 (ref 5.0–8.0)

## 2015-05-22 LAB — COMPREHENSIVE METABOLIC PANEL
ALBUMIN: 4.3 g/dL (ref 3.5–5.0)
ALT: 18 U/L (ref 14–54)
AST: 48 U/L — AB (ref 15–41)
Alkaline Phosphatase: 165 U/L — ABNORMAL HIGH (ref 38–126)
BUN: 6 mg/dL (ref 6–20)
CO2: 37 mmol/L — AB (ref 22–32)
CREATININE: 0.58 mg/dL (ref 0.44–1.00)
Calcium: 9 mg/dL (ref 8.9–10.3)
GFR calc Af Amer: >60 mL/min (ref >60)
GLUCOSE: 165 mg/dL — AB (ref 65–99)
POTASSIUM: 2.3 mmol/L — AB (ref 3.5–5.1)
Sodium: 114 mmol/L — CL (ref 135–145)
Total Bilirubin: 1 mg/dL (ref 0.3–1.2)
Total Protein: 8 g/dL (ref 6.5–8.1)

## 2015-05-22 LAB — MRSA PCR SCREENING: MRSA BY PCR: NEGATIVE

## 2015-05-22 LAB — BRAIN NATRIURETIC PEPTIDE: B Natriuretic Peptide: 359 pg/mL — ABNORMAL HIGH (ref 0.0–100.0)

## 2015-05-22 LAB — LACTIC ACID, PLASMA
Lactic Acid, Venous: 1.2 mmol/L (ref 0.5–2.0)
Lactic Acid, Venous: 1 mmol/L (ref 0.5–2.0)

## 2015-05-22 LAB — RAPID INFLUENZA A&B ANTIGENS
Influenza A (ARMC): NEGATIVE
Influenza B (ARMC): NEGATIVE

## 2015-05-22 LAB — BASIC METABOLIC PANEL
Anion gap: 11 (ref 5–15)
BUN: 6 mg/dL (ref 6–20)
CALCIUM: 8.1 mg/dL — AB (ref 8.9–10.3)
CHLORIDE: 70 mmol/L — AB (ref 101–111)
CO2: 35 mmol/L — AB (ref 22–32)
CREATININE: 0.5 mg/dL (ref 0.44–1.00)
GFR calc non Af Amer: >60 mL/min (ref >60)
GLUCOSE: 136 mg/dL — AB (ref 65–99)
Potassium: 2.4 mmol/L — CL (ref 3.5–5.1)
Sodium: 116 mmol/L — CL (ref 135–145)

## 2015-05-22 LAB — CK: CK TOTAL: 772 U/L — AB (ref 38–234)

## 2015-05-22 LAB — MAGNESIUM: Magnesium: 1.4 mg/dL — ABNORMAL LOW (ref 1.7–2.4)

## 2015-05-22 LAB — OSMOLALITY: OSMOLALITY: 242 mosm/kg — AB (ref 275–295)

## 2015-05-22 LAB — URIC ACID: URIC ACID, SERUM: 4.4 mg/dL (ref 2.3–6.6)

## 2015-05-22 LAB — TSH: TSH: 1.005 u[IU]/mL (ref 0.350–4.500)

## 2015-05-22 LAB — SODIUM: Sodium: 116 mmol/L — CL (ref 135–145)

## 2015-05-22 LAB — SODIUM, URINE, RANDOM: SODIUM UR: 43 mmol/L

## 2015-05-22 LAB — TROPONIN I

## 2015-05-22 LAB — OSMOLALITY, URINE: OSMOLALITY UR: 294 mosm/kg — AB (ref 300–900)

## 2015-05-22 LAB — GLUCOSE, CAPILLARY: Glucose-Capillary: 174 mg/dL — ABNORMAL HIGH (ref 65–99)

## 2015-05-22 MED ORDER — ONDANSETRON HCL 4 MG PO TABS: 4.0000 mg | ORAL_TABLET | Freq: Four times a day (QID) | ORAL | Status: DC | PRN

## 2015-05-22 MED ORDER — FAMOTIDINE 20 MG PO TABS
20.0000 mg | ORAL_TABLET | Freq: Every day | ORAL | Status: DC
  Administered 2015-05-22 – 2015-05-24 (×3): 20 mg via ORAL
  Filled 2015-05-22 (×4): qty 1

## 2015-05-22 MED ORDER — DIPHENHYDRAMINE HCL 25 MG PO CAPS
50.0000 mg | ORAL_CAPSULE | Freq: Once | ORAL | Status: AC
  Administered 2015-05-22: 50 mg via ORAL
  Filled 2015-05-22: qty 2

## 2015-05-22 MED ORDER — HYDROCORTISONE NA SUCCINATE PF 250 MG IJ SOLR
200.0000 mg | Freq: Once | INTRAMUSCULAR | Status: DC
  Filled 2015-05-22: qty 200

## 2015-05-22 MED ORDER — GABAPENTIN 100 MG PO CAPS
100.0000 mg | ORAL_CAPSULE | Freq: Every day | ORAL | Status: DC
  Administered 2015-05-22 – 2015-05-24 (×3): 100 mg via ORAL
  Filled 2015-05-22 (×4): qty 1

## 2015-05-22 MED ORDER — BUDESONIDE 0.25 MG/2ML IN SUSP
0.2500 mg | Freq: Three times a day (TID) | RESPIRATORY_TRACT | Status: DC
  Administered 2015-05-22 – 2015-05-23 (×3): 0.25 mg via RESPIRATORY_TRACT
  Filled 2015-05-22 (×3): qty 2

## 2015-05-22 MED ORDER — METHYLPREDNISOLONE SODIUM SUCC 125 MG IJ SOLR
125.0000 mg | Freq: Once | INTRAMUSCULAR | Status: AC
  Administered 2015-05-22: 125 mg via INTRAVENOUS
  Filled 2015-05-22: qty 2

## 2015-05-22 MED ORDER — BACLOFEN 10 MG PO TABS: 10.0000 mg | ORAL_TABLET | Freq: Every evening | ORAL | Status: DC | PRN

## 2015-05-22 MED ORDER — IPRATROPIUM-ALBUTEROL 0.5-2.5 (3) MG/3ML IN SOLN
3.0000 mL | RESPIRATORY_TRACT | Status: DC
  Administered 2015-05-22 – 2015-05-23 (×5): 3 mL via RESPIRATORY_TRACT
  Filled 2015-05-22 (×5): qty 3

## 2015-05-22 MED ORDER — CHLORHEXIDINE GLUCONATE 0.12 % MT SOLN
15.0000 mL | Freq: Two times a day (BID) | OROMUCOSAL | Status: DC
  Administered 2015-05-22 (×2): 15 mL via OROMUCOSAL
  Filled 2015-05-22 (×2): qty 15

## 2015-05-22 MED ORDER — DEXTROSE 5 % IV SOLN
1.0000 g | INTRAVENOUS | Status: DC
  Administered 2015-05-22: 1 g via INTRAVENOUS
  Filled 2015-05-22 (×2): qty 10

## 2015-05-22 MED ORDER — LORATADINE 10 MG PO TABS
10.0000 mg | ORAL_TABLET | Freq: Every day | ORAL | Status: DC
  Administered 2015-05-22: 10 mg via ORAL
  Filled 2015-05-22: qty 1

## 2015-05-22 MED ORDER — POTASSIUM CHLORIDE 10 MEQ/100ML IV SOLN
10.0000 meq | INTRAVENOUS | Status: AC
  Administered 2015-05-22 (×4): 10 meq via INTRAVENOUS
  Filled 2015-05-22 (×4): qty 100

## 2015-05-22 MED ORDER — IOPAMIDOL (ISOVUE-370) INJECTION 76%
75.0000 mL | Freq: Once | INTRAVENOUS | Status: AC | PRN
  Administered 2015-05-22: 75 mL via INTRAVENOUS

## 2015-05-22 MED ORDER — SERTRALINE HCL 50 MG PO TABS
100.0000 mg | ORAL_TABLET | Freq: Every day | ORAL | Status: DC
  Administered 2015-05-22 – 2015-05-24 (×3): 100 mg via ORAL
  Filled 2015-05-22 (×4): qty 2

## 2015-05-22 MED ORDER — MORPHINE SULFATE (PF) 2 MG/ML IV SOLN
2.0000 mg | INTRAVENOUS | Status: DC | PRN
  Administered 2015-05-23 – 2015-05-26 (×2): 2 mg via INTRAVENOUS
  Filled 2015-05-22 (×2): qty 1

## 2015-05-22 MED ORDER — SODIUM CHLORIDE 0.9 % IV BOLUS (SEPSIS)
250.0000 mL | Freq: Once | INTRAVENOUS | Status: AC
  Administered 2015-05-22: 250 mL via INTRAVENOUS

## 2015-05-22 MED ORDER — SENNA-DOCUSATE SODIUM 8.6-50 MG PO TABS
1.0000 | ORAL_TABLET | Freq: Every day | ORAL | Status: DC
  Administered 2015-05-22 – 2015-05-24 (×3): 1 via ORAL
  Filled 2015-05-22 (×6): qty 1

## 2015-05-22 MED ORDER — CETYLPYRIDINIUM CHLORIDE 0.05 % MT LIQD
7.0000 mL | Freq: Two times a day (BID) | OROMUCOSAL | Status: DC
Start: 2015-05-22 — End: 2015-05-23
  Administered 2015-05-22 (×2): 7 mL via OROMUCOSAL

## 2015-05-22 MED ORDER — TIOTROPIUM BROMIDE MONOHYDRATE 18 MCG IN CAPS
18.0000 ug | ORAL_CAPSULE | Freq: Every day | RESPIRATORY_TRACT | Status: DC
  Filled 2015-05-22: qty 5

## 2015-05-22 MED ORDER — MAGNESIUM SULFATE 4 GM/100ML IV SOLN
4.0000 g | Freq: Once | INTRAVENOUS | Status: AC
  Administered 2015-05-22: 4 g via INTRAVENOUS
  Filled 2015-05-22: qty 100

## 2015-05-22 MED ORDER — HEPARIN SODIUM (PORCINE) 5000 UNIT/ML IJ SOLN
5000.0000 [IU] | Freq: Three times a day (TID) | INTRAMUSCULAR | Status: DC
  Administered 2015-05-22 – 2015-05-23 (×3): 5000 [IU] via SUBCUTANEOUS
  Filled 2015-05-22 (×3): qty 1

## 2015-05-22 MED ORDER — POTASSIUM CHLORIDE CRYS ER 20 MEQ PO TBCR: 40.0000 meq | EXTENDED_RELEASE_TABLET | Freq: Once | ORAL | Status: DC

## 2015-05-22 MED ORDER — AZITHROMYCIN 250 MG PO TABS: 250.0000 mg | ORAL_TABLET | Freq: Every day | ORAL | Status: DC

## 2015-05-22 MED ORDER — ONDANSETRON HCL 4 MG/2ML IJ SOLN
4.0000 mg | Freq: Four times a day (QID) | INTRAMUSCULAR | Status: DC | PRN
  Administered 2015-05-31 – 2015-06-01 (×2): 4 mg via INTRAVENOUS
  Filled 2015-05-22 (×2): qty 2

## 2015-05-22 MED ORDER — TAMSULOSIN HCL 0.4 MG PO CAPS
0.4000 mg | ORAL_CAPSULE | Freq: Every day | ORAL | Status: DC
  Administered 2015-05-22: 0.4 mg via ORAL
  Filled 2015-05-22: qty 1

## 2015-05-22 MED ORDER — METHYLPREDNISOLONE SODIUM SUCC 125 MG IJ SOLR: 60.0000 mg | INTRAMUSCULAR | Status: DC

## 2015-05-22 MED ORDER — PHENYTOIN SODIUM EXTENDED 100 MG PO CAPS
300.0000 mg | ORAL_CAPSULE | Freq: Every day | ORAL | Status: DC
  Administered 2015-05-22 – 2015-05-24 (×3): 300 mg via ORAL
  Filled 2015-05-22 (×4): qty 3

## 2015-05-22 MED ORDER — ACETAMINOPHEN 650 MG RE SUPP: 650.0000 mg | Freq: Four times a day (QID) | RECTAL | Status: DC | PRN

## 2015-05-22 MED ORDER — ALPRAZOLAM 0.5 MG PO TABS
0.5000 mg | ORAL_TABLET | Freq: Two times a day (BID) | ORAL | Status: DC | PRN
  Administered 2015-05-22 – 2015-05-24 (×4): 0.5 mg via ORAL
  Filled 2015-05-22 (×6): qty 1

## 2015-05-22 MED ORDER — SODIUM CHLORIDE 0.9% FLUSH
3.0000 mL | Freq: Two times a day (BID) | INTRAVENOUS | Status: DC
  Administered 2015-05-22 – 2015-06-07 (×29): 3 mL via INTRAVENOUS

## 2015-05-22 MED ORDER — AZITHROMYCIN 250 MG PO TABS
500.0000 mg | ORAL_TABLET | Freq: Every day | ORAL | Status: AC
Start: 2015-05-22 — End: 2015-05-22
  Administered 2015-05-22: 500 mg via ORAL
  Filled 2015-05-22: qty 2

## 2015-05-22 MED ORDER — SODIUM CHLORIDE 0.9 % IV BOLUS (SEPSIS)
1000.0000 mL | Freq: Once | INTRAVENOUS | Status: AC
  Administered 2015-05-22: 1000 mL via INTRAVENOUS

## 2015-05-22 MED ORDER — ATENOLOL 50 MG PO TABS
25.0000 mg | ORAL_TABLET | Freq: Every day | ORAL | Status: DC
  Administered 2015-05-22 – 2015-05-24 (×3): 25 mg via ORAL
  Filled 2015-05-22 (×4): qty 1

## 2015-05-22 MED ORDER — IPRATROPIUM-ALBUTEROL 0.5-2.5 (3) MG/3ML IN SOLN
3.0000 mL | Freq: Once | RESPIRATORY_TRACT | Status: AC
  Administered 2015-05-22: 3 mL via RESPIRATORY_TRACT
  Filled 2015-05-22: qty 3

## 2015-05-22 MED ORDER — BUDESONIDE 0.25 MG/2ML IN SUSP: 0.2500 mg | Freq: Three times a day (TID) | RESPIRATORY_TRACT | Status: DC

## 2015-05-22 MED ORDER — POTASSIUM CHLORIDE 20 MEQ PO PACK: 40.0000 meq | PACK | Freq: Two times a day (BID) | ORAL | Status: DC

## 2015-05-22 MED ORDER — PREDNISONE 10 MG PO TABS: 50.0000 mg | ORAL_TABLET | Freq: Four times a day (QID) | ORAL | Status: DC

## 2015-05-22 MED ORDER — FLUTICASONE FUROATE-VILANTEROL 200-25 MCG/INH IN AEPB
1.0000 | INHALATION_SPRAY | Freq: Every day | RESPIRATORY_TRACT | Status: DC
  Administered 2015-05-22: 1 via RESPIRATORY_TRACT
  Filled 2015-05-22: qty 28

## 2015-05-22 MED ORDER — LABETALOL HCL 5 MG/ML IV SOLN: 10.0000 mg | INTRAVENOUS | Status: DC | PRN

## 2015-05-22 MED ORDER — POTASSIUM CHLORIDE IN NACL 40-0.9 MEQ/L-% IV SOLN
INTRAVENOUS | Status: DC
  Administered 2015-05-22 – 2015-05-23 (×2): 75 mL/h via INTRAVENOUS
  Filled 2015-05-22 (×5): qty 1000

## 2015-05-22 MED ORDER — HYDRALAZINE HCL 20 MG/ML IJ SOLN
10.0000 mg | INTRAMUSCULAR | Status: DC | PRN
  Administered 2015-05-22 – 2015-05-26 (×8): 20 mg via INTRAVENOUS
  Filled 2015-05-22 (×8): qty 1

## 2015-05-22 MED ORDER — POTASSIUM CHLORIDE 20 MEQ PO PACK
80.0000 meq | PACK | Freq: Two times a day (BID) | ORAL | Status: DC
  Administered 2015-05-22: 80 meq via ORAL
  Filled 2015-05-22: qty 4

## 2015-05-22 MED ORDER — POTASSIUM CHLORIDE IN NACL 20-0.9 MEQ/L-% IV SOLN
INTRAVENOUS | Status: DC
  Administered 2015-05-22: 12:00:00 via INTRAVENOUS
  Filled 2015-05-22 (×3): qty 1000

## 2015-05-22 MED ORDER — ACETAMINOPHEN 325 MG PO TABS
650.0000 mg | ORAL_TABLET | Freq: Four times a day (QID) | ORAL | Status: DC | PRN
  Administered 2015-05-23 – 2015-06-07 (×10): 650 mg via ORAL
  Filled 2015-05-22 (×11): qty 2

## 2015-05-22 MED ORDER — HYDROCORTISONE NA SUCCINATE PF 250 MG IJ SOLR
200.0000 mg | Freq: Once | INTRAMUSCULAR | Status: AC
  Administered 2015-05-22: 200 mg via INTRAVENOUS
  Filled 2015-05-22: qty 200

## 2015-05-22 MED ORDER — POTASSIUM CHLORIDE 20 MEQ PO PACK
40.0000 meq | PACK | Freq: Once | ORAL | Status: AC
  Administered 2015-05-22: 40 meq via ORAL
  Filled 2015-05-22: qty 2

## 2015-05-22 MED ORDER — OXYCODONE HCL 5 MG PO TABS: 5.0000 mg | ORAL_TABLET | ORAL | Status: DC | PRN

## 2015-05-22 MED ORDER — MAGNESIUM SULFATE 2 GM/50ML IV SOLN
2.0000 g | Freq: Once | INTRAVENOUS | Status: DC
  Filled 2015-05-22: qty 50

## 2015-05-22 MED ORDER — SIMVASTATIN 20 MG PO TABS
20.0000 mg | ORAL_TABLET | Freq: Every day | ORAL | Status: DC
  Administered 2015-05-22: 20 mg via ORAL
  Filled 2015-05-22: qty 1

## 2015-05-22 NOTE — ED Notes (Signed)
[facility] for a [facility] last PM. Patient has been in the floor since falling. Patient c/o left leg pain and being unable to move her left leg. No shortening or rotation of the left leg noted. There is shortening of the right leg but patient denies pain in that leg.

## 2015-05-22 NOTE — Consult Note (Addendum)
PULMONARY / CRITICAL CARE MEDICINE   Name: Candace Woods MRN: [id number] DOB: [birth year]    ADMISSION DATE:  05/22/2015 CONSULTATION DATE:  05/22/15  REFERRING MD :  Dr. Clint GuyHower   CHIEF COMPLAINT:     Accidental fall/sob  Reason for consult - sob/copd exacerbation/   HISTORY OF PRESENT ILLNESS  75 year old female past medical history of COPD, not requiring oxygen, hypertension, history of seizures, peripheral neuropathy, admitted to the hospital after mechanical fall at home, unable to arise due to weakness. On arrival to the emergency department noted to be hypoxic with O2 saturations in the 70s, had 3 placed on a nonrebreather, however continue to have increased work of breathing and placed on BiPAP. O2 saturations increased to the high 80s and low 90s, and patient was transferred to stepdown unit for further monitoring. She denies any fever, chills, states she has a nonproductive cough and shortness of breath at baseline.   She states that over the last couple today she's not had much appetite, has been feeling weak, and lethargic. She smokes about 2 packs of cigarettes per day over the past 55 years. Daughter gave history that she had an accidental fall from weakness about 2 weeks ago, however was able to rise by herself. Patient stated that she is a DO NOT RESUSCITATE/DO NOT INTUBATE, daughter who is healthcare power of attorney, agreed.   SIGNIFICANT EVENTS  4/9>>Accidental fall at home, found down by EMS, admitted with weakness, shortness of breath, COPD exacerbation, requiring BiPAP    PAST MEDICAL HISTORY    :  Past Medical History  Diagnosis Date  . COPD (chronic obstructive pulmonary disease) (HCC)   . Hypertension   . Seizures (HCC)   . Peripheral neuropathy (HCC)   . Torticollis Right  . Anxiety   . Depression   . Psychogenic polydipsia   . MRSA pneumonia (HCC)     2014  . Hyperlipemia    Past Surgical History  Procedure Laterality Date  . Abdominal  hysterectomy     Prior to Admission medications   Medication Sig Start Date End Date Taking? Authorizing Provider  albuterol (PROVENTIL) (2.5 MG/3ML) 0.083% nebulizer solution Take 2.5 mg by nebulization every 4 (four) hours as needed for wheezing or shortness of breath.   Yes Historical Provider, MD  ALPRAZolam Prudy Feeler(XANAX) 0.5 MG tablet Take 0.5 mg by mouth 2 (two) times daily as needed for anxiety.   Yes Historical Provider, MD  atenolol (TENORMIN) 25 MG tablet Take 25 mg by mouth daily.   Yes Historical Provider, MD  baclofen (LIORESAL) 10 MG tablet Take 10 mg by mouth at bedtime as needed for muscle spasms.   Yes Historical Provider, MD  Fluticasone-Salmeterol (ADVAIR) 250-50 MCG/DOSE AEPB Inhale 1 puff into the lungs 2 (two) times daily.   Yes Historical Provider, MD  furosemide (LASIX) 40 MG tablet Take 40 mg by mouth daily.   Yes Historical Provider, MD  gabapentin (NEURONTIN) 100 MG capsule Take 100 mg by mouth daily.   Yes Historical Provider, MD  ipratropium-albuterol (DUONEB) 0.5-2.5 (3) MG/3ML SOLN Take 3 mLs by nebulization every 4 (four) hours as needed (wheeze).   Yes Historical Provider, MD  loratadine (CLARITIN) 10 MG tablet Take 10 mg by mouth daily.   Yes Historical Provider, MD  phenytoin (DILANTIN) 300 MG ER capsule Take 300 mg by mouth at bedtime.   Yes Historical Provider, MD  ranitidine (ZANTAC) 150 MG tablet Take 150 mg by mouth 2 (two) times daily.   Yes  Historical Provider, MD  sennosides-docusate sodium (SENOKOT-S) 8.6-50 MG tablet Take 1 tablet by mouth daily.   Yes Historical Provider, MD  sertraline (ZOLOFT) 100 MG tablet Take 100 mg by mouth daily.   Yes Historical Provider, MD  simvastatin (ZOCOR) 20 MG tablet Take 20 mg by mouth daily at 6 PM.   Yes Historical Provider, MD  tamsulosin (FLOMAX) 0.4 MG CAPS capsule Take 0.4 mg by mouth daily.   Yes Historical Provider, MD  tiotropium (SPIRIVA) 18 MCG inhalation capsule Place 18 mcg into inhaler and inhale daily.   Yes  Historical Provider, MD  zolpidem (AMBIEN) 5 MG tablet Take 5 mg by mouth at bedtime as needed for sleep.   Yes Historical Provider, MD   Allergies  Allergen Reactions  . Aspirin   . Codeine   . Penicillins   . Sulfa Antibiotics      FAMILY HISTORY   Family History  Problem Relation Age of Onset  . Hypertension Other       SOCIAL HISTORY    reports that she has been smoking.  She does not have any smokeless tobacco history on file. She reports that she does not drink alcohol or use illicit drugs.  Review of Systems  Constitutional: Positive for malaise/fatigue. Negative for fever, chills and diaphoresis.  HENT: Negative for hearing loss and tinnitus.   Eyes: Negative for double vision.  Respiratory: Positive for cough and shortness of breath. Negative for hemoptysis, sputum production and wheezing.   Cardiovascular: Negative for chest pain.  Gastrointestinal: Negative for heartburn, nausea and vomiting.  Genitourinary: Negative for dysuria.  Musculoskeletal: Positive for joint pain. Negative for myalgias.  Skin: Negative for itching.  Neurological: Positive for dizziness and weakness. Negative for tingling, tremors and headaches.      VITAL SIGNS    Temp:  [98 F (36.7 C)] 98 F (36.7 C) (04/09 1321) Pulse Rate:  [66-87] 85 (04/09 1400) Resp:  [15-27] 27 (04/09 1400) BP: (160-209)/(82-103) 191/102 mmHg (04/09 1400) SpO2:  [71 %-96 %] 93 % (04/09 1400) Weight:  [166 lb (75.297 kg)-180 lb 12.4 oz (82 kg)] 180 lb 12.4 oz (82 kg) (04/09 1321) HEMODYNAMICS:   VENTILATOR SETTINGS:   INTAKE / OUTPUT: No intake or output data in the 24 hours ending 05/22/15 1448     PHYSICAL EXAM   Physical Exam  Constitutional: She is oriented to person, place, and time. She appears well-developed and well-nourished.  HENT:  Head: Normocephalic and atraumatic.  Right Ear: External ear normal.  Left Ear: External ear normal.  Eyes: Conjunctivae and EOM are normal. Pupils  are equal, round, and reactive to light.  Neck: Normal range of motion. Neck supple.  Cardiovascular: Normal rate, regular rhythm, normal heart sounds and intact distal pulses.  Exam reveals no friction rub.   No murmur heard. Pulmonary/Chest: Effort normal.  Wearing bipap, no use of accessory muscles. Dec basilar BS  Abdominal: Soft. Bowel sounds are normal. She exhibits no distension. There is no tenderness.  Musculoskeletal: She exhibits no edema.  Neurological: She is alert and oriented to person, place, and time.  Skin: Skin is warm and dry.  Nursing note and vitals reviewed.      LABS   LABS:  CBC  Recent Labs Lab 05/22/15 0821  WBC 9.3  HGB 17.0*  HCT 50.1*  PLT 189   Coag's No results for input(s): APTT, INR in the last 168 hours. BMET  Recent Labs Lab 05/22/15 0821  NA 114*  K 2.3*  CL <65*  CO2 37*  BUN 6  CREATININE 0.58  GLUCOSE 165*   Electrolytes  Recent Labs Lab 05/22/15 0821  CALCIUM 9.0  MG 1.4*   Sepsis Markers  Recent Labs Lab 05/22/15 0821 05/22/15 1403  LATICACIDVEN 1.2 1.0   ABG No results for input(s): PHART, PCO2ART, PO2ART in the last 168 hours. Liver Enzymes  Recent Labs Lab 05/22/15 0821  AST 48*  ALT 18  ALKPHOS 165*  BILITOT 1.0  ALBUMIN 4.3   Cardiac Enzymes  Recent Labs Lab 05/22/15 0821  TROPONINI <0.03   Glucose  Recent Labs Lab 05/22/15 1320  GLUCAP 174*     Recent Results (from the past 240 hour(s))  Rapid Influenza A&B Antigens ([facility] only)     Status: None   Collection Time: 05/22/15  8:44 AM  Result Value Ref Range Status   Influenza A ([facility]) NEGATIVE NEGATIVE Final   Influenza B ([facility]) NEGATIVE NEGATIVE Final     Current facility-administered medications:  .  0.9 % NaCl with KCl 20 mEq/ L  infusion, , Intravenous, Continuous, Wyatt Haste, MD, Last Rate: 100 mL/hr at 05/22/15 1401 .  acetaminophen (TYLENOL) tablet 650 mg, 650 mg, Oral, Q6H PRN **OR** acetaminophen (TYLENOL)  suppository 650 mg, 650 mg, Rectal, Q6H PRN, Wyatt Haste, MD .  ALPRAZolam Prudy Feeler) tablet 0.5 mg, 0.5 mg, Oral, BID PRN, Wyatt Haste, MD .  antiseptic oral rinse (CPC / CETYLPYRIDINIUM CHLORIDE 0.05%) solution 7 mL, 7 mL, Mouth Rinse, q12n4p, Aristide Waggle, MD .  atenolol (TENORMIN) tablet 25 mg, 25 mg, Oral, Daily, Wyatt Haste, MD, 25 mg at 05/22/15 1404 .  azithromycin (ZITHROMAX) tablet 500 mg, 500 mg, Oral, Daily **FOLLOWED BY** [START ON 05/23/2015] azithromycin (ZITHROMAX) tablet 250 mg, 250 mg, Oral, Daily, Aryahi Denzler, MD .  baclofen (LIORESAL) tablet 10 mg, 10 mg, Oral, QHS PRN, Wyatt Haste, MD .  chlorhexidine (PERIDEX) 0.12 % solution 15 mL, 15 mL, Mouth Rinse, BID, Loretto Belinsky, MD .  famotidine (PEPCID) tablet 20 mg, 20 mg, Oral, Daily, Wyatt Haste, MD .  fluticasone furoate-vilanterol (BREO ELLIPTA) 200-25 MCG/INH 1 puff, 1 puff, Inhalation, Daily, Wyatt Haste, MD .  gabapentin (NEURONTIN) capsule 100 mg, 100 mg, Oral, Daily, Wyatt Haste, MD .  heparin injection 5,000 Units, 5,000 Units, Subcutaneous, 3 times per day, Wyatt Haste, MD .  hydrALAZINE (APRESOLINE) injection 10-20 mg, 10-20 mg, Intravenous, Q4H PRN, Hannah Crill, MD .  ipratropium-albuterol (DUONEB) 0.5-2.5 (3) MG/3ML nebulizer solution 3 mL, 3 mL, Nebulization, Q4H, Wyatt Haste, MD, 3 mL at 05/22/15 1443 .  labetalol (NORMODYNE,TRANDATE) injection 10 mg, 10 mg, Intravenous, Q2H PRN, Mackinzie Vuncannon, MD .  loratadine (CLARITIN) tablet 10 mg, 10 mg, Oral, Daily, Wyatt Haste, MD .  magnesium sulfate IVPB 4 g 100 mL, 4 g, Intravenous, Once, Kaleesi Guyton, MD .  Melene Muller ON 05/23/2015] methylPREDNISolone sodium succinate (SOLU-MEDROL) 125 mg/2 mL injection 60 mg, 60 mg, Intravenous, Q24H, Wyatt Haste, MD .  morphine 2 MG/ML injection 2 mg, 2 mg, Intravenous, Q4H PRN, Wyatt Haste, MD .  ondansetron (ZOFRAN) tablet 4 mg, 4 mg, Oral, Q6H PRN **OR** ondansetron (ZOFRAN) injection 4 mg, 4 mg, Intravenous, Q6H  PRN, Wyatt Haste, MD .  oxyCODONE (Oxy IR/ROXICODONE) immediate release tablet 5 mg, 5 mg, Oral, Q4H PRN, Wyatt Haste, MD .  phenytoin (DILANTIN) ER capsule 300 mg, 300 mg, Oral, QHS, Wyatt Haste, MD .  potassium chloride (KLOR-CON)  packet 40 mEq, 40 mEq, Oral, BID, Braddock Servellon, MD .  potassium chloride 10 mEq in 100 mL IVPB, 10 mEq, Intravenous, Q1 Hr x 4, Sheema M Hallaji, RPH .  sennosides-docusate sodium (SENOKOT-S) 8.6-50 MG tablet 1 tablet, 1 tablet, Oral, Daily, Wyatt Haste, MD .  sertraline (ZOLOFT) tablet 100 mg, 100 mg, Oral, Daily, Wyatt Haste, MD .  simvastatin (ZOCOR) tablet 20 mg, 20 mg, Oral, q1800, Wyatt Haste, MD .  sodium chloride flush (NS) 0.9 % injection 3 mL, 3 mL, Intravenous, Q12H, Wyatt Haste, MD .  tamsulosin [facility]) capsule 0.4 mg, 0.4 mg, Oral, Daily, Wyatt Haste, MD .  Melene Muller ON 05/23/2015] tiotropium [facility]) inhalation capsule 18 mcg, 18 mcg, Inhalation, Daily, Wyatt Haste, MD  IMAGING    Ct Head Wo Contrast  05/22/2015  CLINICAL DATA:  Fall last evening.  Hypoxic and confusion.  COPD. EXAM: CT HEAD WITHOUT CONTRAST TECHNIQUE: Contiguous axial images were obtained from the base of the skull through the vertex without contrast. COMPARISON:  12/22/2007 FINDINGS: Subtle low-density in the subcortical white matter. There is focal white matter disease involving the left external capsule and the left white matter tracts. This could represent ischemic changes of unknown age. No evidence for acute hemorrhage, mass lesion, midline shift, hydrocephalus or large new infarct. No calvarial fracture. Visualized paranasal sinuses are clear. IMPRESSION: No acute intracranial abnormality. Age indeterminate white matter disease, particularly on the left side as described. Findings may represent old ischemic changes. Electronically Signed   By: Richarda Overlie M.D.   On: 05/22/2015 09:23   Dg Chest Portable 1 View  05/22/2015  CLINICAL DATA:  75 year old who had an  unwitnessed fall at her home last night. Current smoker with current history of COPD. EXAM: PORTABLE CHEST 1 VIEW COMPARISON:  CT chest 08/11/2013. Chest x-rays 09/11/2012 and earlier. FINDINGS: Cardiac silhouette upper normal in size to slightly enlarged, unchanged. Emphysematous changes throughout both lungs with prominent bronchovascular markings diffusely and mild central peribronchial thickening, unchanged. Mild atelectasis in the lung bases. Lungs otherwise clear. No pneumothorax. No visible pleural effusions. Osseous demineralization. IMPRESSION: COPD/emphysema. Mild bibasilar atelectasis. No acute cardiopulmonary disease otherwise. Electronically Signed   By: Hulan Saas M.D.   On: 05/22/2015 09:34      Indwelling Urinary Catheter continued, requirement due to   Reason to continue Indwelling Urinary Catheter for strict Intake/Output monitoring for hemodynamic instability   Central Line continued, requirement due to   Reason to continue [employer] Monitoring of central venous pressure or other hemodynamic parameters   Ventilator continued, requirement due to, resp failure    Ventilator Sedation RASS 0 to -2   Cultures: BCx2 4/9>> UC 4/9>> Sputum 4/9>>  Antibiotics: Zpak x 5 days starting 4/9 Rocephin 4/9>  Lines: PIVs  ASSESSMENT/PLAN  75 year old female past medical history of COPD, hypertension, seizure disorder, tobacco abuse, seen in consultation for shortness of breath, COPD exacerbation  PULMONARY Dyspnea COPD COPD exacerbation Hyponatremia Weakness Hypercapnia CAP  P:   -Continue with BiPAP (QHS and PRN), maintain O2 saturations greater than 88% -Treat COPD exacerbation with IV steroids, azithromycin for anti-inflammatory effect, nebulizers. CAP tx with azithro/rocephin -Pulmicort nebulizer 3 times a day 2 days -May use supplemental oxygen -Consider using high flow nasal cannula when off BiPAP for the next 24-48 hours -Chest x-ray reviewed  moderate to severe emphysema noted, -CT chest 4/9>>RML/RLL infiltrate, emphysematous changes   CARDIOVASCULAR Hypertension - hold lasix given hypokalemia - prn labetalol/hydralazine - monitor hemodynamics -  BNP~360  RENAL Hypokalemia Hyponatremia Hypomagnesemia P:   - Na =114, currently on NS with of K@100cc /hr, recheck BMP  - replace electrolytes  GASTROINTESTINAL SUP:PPI  HEMATOLOGIC -CBC   INFECTIOUS CAP -F/U cultures - cont with azithro/rocephin  ENDOCRINE -SSI  NEUROLOGIC RASS goal: 0 Weakness - correct Na   SOCIAL - daughter (HCPOA) at bedside, updated on plan of care - patient stated that she is a DNR/DNI, HCPOA attest to this and agreed - RN witness - Pam C  I have personally obtained a history, examined the patient, evaluated laboratory and imaging results, formulated the assessment and plan and placed orders.  Pulmonary Care Time devoted to patient care services described in this note is 45 minutes.     Stephanie Acre, MD [facility] Pager [phone] (please enter 7-digits) On Call Pager - [phone] (please enter 7-digits)  05/22/2015, 2:48 PM  Note: This note was prepared with Dragon dictation along with smaller phrase technology. Any transcriptional errors that result from this process are unintentional.

## 2015-05-22 NOTE — Progress Notes (Signed)
After wearing bipap for a little over an hour, patient states she is dry, and pressure makes it hard for her to breath. Took patient off bipap and placed her back on HFNC. Tolerating well

## 2015-05-22 NOTE — ED Notes (Signed)
Patient sating at 79% on 3L O2 via [place]. O2 increased to 5L via [place]

## 2015-05-22 NOTE — H&P (Signed)
[employer] [facility] [place] [facility]   PATIENT NAME: Candace Woods    MR#:  [id number]  DATE OF BIRTH:  06/03/1940   DATE OF ADMISSION:  05/22/2015  PRIMARY CARE PHYSICIAN: Pcp Not In System   REQUESTING/REFERRING PHYSICIAN: Sharma Covert  CHIEF COMPLAINT:   Chief Complaint  Patient presents with  . Fall  Shortness of breath  HISTORY OF PRESENT ILLNESS:  Candace Woods  is a 75 y.o. female with a known history of COPD non-oxygen requiring presenting the hospital after mechanical fall at home unable to rise off floor secondary to weakness. On arrival to emergency department noted to be hypoxic with O2 saturations in the mid 70s on room air and had improvement of oxygenation requiring nonrebreather however given increased work of breathing placed on BiPAP therapy. O2 saturations remained in the high 80s. She complains of nonproductive cough shortness of breath however, states this is her baseline.  PAST MEDICAL HISTORY:   Past Medical History  Diagnosis Date  . COPD (chronic obstructive pulmonary disease) (HCC)   . Hypertension   . Seizures (HCC)   . Peripheral neuropathy (HCC)   . Torticollis Right  . Anxiety   . Depression   . Psychogenic polydipsia   . MRSA pneumonia (HCC)     2014  . Hyperlipemia     PAST SURGICAL HISTORY:   Past Surgical History  Procedure Laterality Date  . Abdominal hysterectomy      SOCIAL HISTORY:   Social History  Substance Use Topics  . Smoking status: Current Every Day Smoker  . Smokeless tobacco: Not on file  . Alcohol Use: No    FAMILY HISTORY:   Family History  Problem Relation Age of Onset  . Hypertension Other     DRUG ALLERGIES:   Allergies  Allergen Reactions  . Aspirin   . Codeine   . Penicillins   . Sulfa Antibiotics     REVIEW OF SYSTEMS:  REVIEW OF SYSTEMS:  CONSTITUTIONAL: Denies fevers, chills, Positive fatigue, weakness.  EYES: Denies blurred vision, double vision, or eye pain.  EARS, NOSE,  THROAT: Denies tinnitus, ear pain, hearing loss.  RESPIRATORY: Positive cough, shortness of breath, wheezing  CARDIOVASCULAR: Denies chest pain, palpitations, edema.  GASTROINTESTINAL: Denies nausea, vomiting, diarrhea, abdominal pain.  GENITOURINARY: Denies dysuria, hematuria.  ENDOCRINE: Denies nocturia or thyroid problems. HEMATOLOGIC AND LYMPHATIC: Denies easy bruising or bleeding.  SKIN: Denies rash or lesions.  MUSCULOSKELETAL: Denies pain in neck, back, shoulder, knees, hips, or further arthritic symptoms. Positive leg stiffness left greater than right NEUROLOGIC: Denies paralysis, paresthesias.  PSYCHIATRIC: Denies anxiety or depressive symptoms. Otherwise full review of systems performed by me is negative.   MEDICATIONS AT HOME:   Prior to Admission medications   Medication Sig Start Date End Date Taking? Authorizing Provider  albuterol (PROVENTIL) (2.5 MG/3ML) 0.083% nebulizer solution Take 2.5 mg by nebulization every 4 (four) hours as needed for wheezing or shortness of breath.   Yes Historical Provider, MD  ALPRAZolam Prudy Feeler) 0.5 MG tablet Take 0.5 mg by mouth 2 (two) times daily as needed for anxiety.   Yes Historical Provider, MD  atenolol (TENORMIN) 25 MG tablet Take 25 mg by mouth daily.   Yes Historical Provider, MD  baclofen (LIORESAL) 10 MG tablet Take 10 mg by mouth at bedtime as needed for muscle spasms.   Yes Historical Provider, MD  Fluticasone-Salmeterol (ADVAIR) 250-50 MCG/DOSE AEPB Inhale 1 puff into the lungs 2 (two) times daily.   Yes Historical Provider,  MD  furosemide (LASIX) 40 MG tablet Take 40 mg by mouth daily.   Yes Historical Provider, MD  gabapentin (NEURONTIN) 100 MG capsule Take 100 mg by mouth daily.   Yes Historical Provider, MD  ipratropium-albuterol (DUONEB) 0.5-2.5 (3) MG/3ML SOLN Take 3 mLs by nebulization every 4 (four) hours as needed (wheeze).   Yes Historical Provider, MD  loratadine (CLARITIN) 10 MG tablet Take 10 mg by mouth daily.   Yes  Historical Provider, MD  phenytoin (DILANTIN) 300 MG ER capsule Take 300 mg by mouth at bedtime.   Yes Historical Provider, MD  ranitidine (ZANTAC) 150 MG tablet Take 150 mg by mouth 2 (two) times daily.   Yes Historical Provider, MD  sennosides-docusate sodium (SENOKOT-S) 8.6-50 MG tablet Take 1 tablet by mouth daily.   Yes Historical Provider, MD  sertraline (ZOLOFT) 100 MG tablet Take 100 mg by mouth daily.   Yes Historical Provider, MD  simvastatin (ZOCOR) 20 MG tablet Take 20 mg by mouth daily at 6 PM.   Yes Historical Provider, MD  tamsulosin (FLOMAX) 0.4 MG CAPS capsule Take 0.4 mg by mouth daily.   Yes Historical Provider, MD  tiotropium (SPIRIVA) 18 MCG inhalation capsule Place 18 mcg into inhaler and inhale daily.   Yes Historical Provider, MD  zolpidem (AMBIEN) 5 MG tablet Take 5 mg by mouth at bedtime as needed for sleep.   Yes Historical Provider, MD      VITAL SIGNS:  Blood pressure 160/82, pulse 76, resp. rate 15, height 5\' 4"  (1.626 m), weight 75.297 kg (166 lb), SpO2 91 %.  PHYSICAL EXAMINATION:  VITAL SIGNS: Filed Vitals:   05/22/15 1000 05/22/15 1030  BP: 160/84 160/82  Pulse: 66 76  Resp: 15 15   GENERAL:74 y.o.female currently Critically ill on BiPAP therapy HEAD: Normocephalic, atraumatic.  EYES: Pupils equal, round, reactive to light. Extraocular muscles intact. No scleral icterus.  MOUTH: Moist mucosal membrane. Dentition intact. No abscess noted.  EAR, NOSE, THROAT: Clear without exudates. No external lesions.  NECK: Supple. No thyromegaly. No nodules. No JVD.  PULMONARY: Diffuse expiratory wheeze scattered coarse rhonchi . No use of accessory muscles, poor respiratory effort. Poor air entry bilaterally requiring BiPAP CHEST: Nontender to palpation.  CARDIOVASCULAR: S1 and S2. Regular rate and rhythm. No murmurs, rubs, or gallops. 2+ left lower extremity edema. Pedal pulses 2+ bilaterally.  GASTROINTESTINAL: Soft, nontender, nondistended. No masses. Positive  bowel sounds. No hepatosplenomegaly.  MUSCULOSKELETAL: No swelling, clubbing, or edema. Range of motion full in all extremities.  NEUROLOGIC: Cranial nerves II through XII are intact. No gross focal neurological deficits. Sensation intact. Reflexes intact.  SKIN: No ulceration, lesions, rashes, or cyanosis. Skin warm and dry. Turgor intact.  PSYCHIATRIC: Mood, affect within normal limits. The patient is awake, alert and oriented x 3. Insight, judgment intact.    LABORATORY PANEL:   CBC  Recent Labs Lab 05/22/15 0821  WBC 9.3  HGB 17.0*  HCT 50.1*  PLT 189   ------------------------------------------------------------------------------------------------------------------  Chemistries   Recent Labs Lab 05/22/15 0821  NA 114*  K 2.3*  CL <65*  CO2 37*  GLUCOSE 165*  BUN 6  CREATININE 0.58  CALCIUM 9.0  AST 48*  ALT 18  ALKPHOS 165*  BILITOT 1.0   ------------------------------------------------------------------------------------------------------------------  Cardiac Enzymes  Recent Labs Lab 05/22/15 0821  TROPONINI <0.03   ------------------------------------------------------------------------------------------------------------------  RADIOLOGY:  Ct Head Wo Contrast  05/22/2015  CLINICAL DATA:  Fall last evening.  Hypoxic and confusion.  COPD. EXAM: CT HEAD WITHOUT  CONTRAST TECHNIQUE: Contiguous axial images were obtained from the base of the skull through the vertex without contrast. COMPARISON:  12/22/2007 FINDINGS: Subtle low-density in the subcortical white matter. There is focal white matter disease involving the left external capsule and the left white matter tracts. This could represent ischemic changes of unknown age. No evidence for acute hemorrhage, mass lesion, midline shift, hydrocephalus or large new infarct. No calvarial fracture. Visualized paranasal sinuses are clear. IMPRESSION: No acute intracranial abnormality. Age indeterminate white matter  disease, particularly on the left side as described. Findings may represent old ischemic changes. Electronically Signed   By: Richarda Overlie M.D.   On: 05/22/2015 09:23   Dg Chest Portable 1 View  05/22/2015  CLINICAL DATA:  75 year old who had an unwitnessed fall at her home last night. Current smoker with current history of COPD. EXAM: PORTABLE CHEST 1 VIEW COMPARISON:  CT chest 08/11/2013. Chest x-rays 09/11/2012 and earlier. FINDINGS: Cardiac silhouette upper normal in size to slightly enlarged, unchanged. Emphysematous changes throughout both lungs with prominent bronchovascular markings diffusely and mild central peribronchial thickening, unchanged. Mild atelectasis in the lung bases. Lungs otherwise clear. No pneumothorax. No visible pleural effusions. Osseous demineralization. IMPRESSION: COPD/emphysema. Mild bibasilar atelectasis. No acute cardiopulmonary disease otherwise. Electronically Signed   By: Hulan Saas M.D.   On: 05/22/2015 09:34    EKG:   Orders placed or performed during the hospital encounter of 05/22/15  . ED EKG  . ED EKG    IMPRESSION AND PLAN:   75 year old Caucasian female history of COPD non-option requiring presenting with mechanical fall and hypoxia  1. Acute on chronic respiratory failure with hypoxia: COPD exacerbation, continue BiPAP wean as tolerated, breathing treatments, steroids no indication for antibiotics at this time. Given unilateral lower extremity edema acute worsening respiratory status will check for pulmonary embolism patient states remains in house with limited mobility but not necessarily bed ridden. 2. Lower extremity edema: Check lower extremity Doppler for DVT 3. Hyponatremia: Severe total sodium deficit 877 mEq, we'll provide normal saline with potassium at 100 mL/hour continue to follow sodium level check urine osmolality, urine sodium 4. Hypokalemia replace goal 4-5, check magnesium 5. Elevated CK without kidney injury: IV fluid  hydration 5. Venous thromboembolism prophylactic: Heparin subcutaneous    All the records are reviewed and case discussed with ED provider. Management plans discussed with the patient, family and they are in agreement.  CODE STATUS: Full  TOTAL TIME TAKING CARE OF THIS PATIENT: 45 critical care minutes.    Hower,  Mardi Mainland.D on 05/22/2015 at 11:46 AM  Between 7am to 6pm - Pager - [phone]  After 6pm: House Pager: - [phone]  [facility]  Office  [phone]  CC: Primary care physician; Pcp Not In System

## 2015-05-22 NOTE — Progress Notes (Signed)
Pharmacy Antibiotic Note  Candace Woods is a 75 y.o. female admitted on 05/22/2015 with pneumonia.  Pharmacy has been consulted for ceftriaxone dosing.  Patient has a penicillin allergy listed with unspecified reaction. Spoke with RN who discussed with patient - patient reports a history of rash with penicillin. Very low chance of cross-reactivity with ceftriaxone. Additionally, per Care Everywhere notes patient has been on ceftin PO in the past.  Plan: Order ceftriaxone 1 g IV daily   Height: 5\' 4"  (162.6 cm) Weight: 180 lb 12.4 oz (82 kg) IBW/kg (Calculated) : 54.7  Temp (24hrs), Avg:98 F (36.7 C), Min:98 F (36.7 C), Max:98 F (36.7 C)   Recent Labs Lab 05/22/15 0821 05/22/15 1403  WBC 9.3  --   CREATININE 0.58  --   LATICACIDVEN 1.2 1.0    Estimated Creatinine Clearance: 62.9 mL/min (by C-G formula based on Cr of 0.58).    Allergies  Allergen Reactions  . Aspirin   . Codeine   . Contrast Media [Iodinated Diagnostic Agents] Hives    Hives with contrast media injections  . Penicillins   . Sulfa Antibiotics     Antimicrobials this admission: azithromycin 4/9 >>  ceftriaxone 4/9 >>   Microbiology results: 4/9 BCx: Pending 4/9 UCx: No growth < 12 hours  4/9 MRSA PCR: negative  Thank you for allowing pharmacy to be a part of this patient's care.  Cindi CarbonMary M Montoya Watkin, PharmD Clinical Pharmacist 05/22/2015 3:49 PM

## 2015-05-22 NOTE — ED Provider Notes (Signed)
[facility] Emergency Department Provider Note  ____________________________________________  Time seen: Approximately 8:26 AM  I have reviewed the triage vital signs and the nursing notes.   HISTORY  Chief Complaint Fall    HPI Candace Woods is a 75 y.o. female with a history of COPD not on oxygen, ongoing tobacco abuse, presenting after a fall.Patient reports that she has gait instability and has frequent falls. I asked night she was trying to walk to turn on her light when she tripped, and fell down. She fell onto her left side and was unable to get up and laid there all night. She denies any preceding chest pain, palpitations, lightheadedness, shortness of breath more than usual. She has not had any recent illness including cough or cold symptoms, nausea vomiting or diarrhea, fever or chills.  The patient denies any headache, visual changes, neck or back pain, numbness tingling or weakness. She states that her entire left leg is "sore."   Past Medical History  Diagnosis Date  . COPD (chronic obstructive pulmonary disease) (HCC)     There are no active problems to display for this patient.   Past Surgical History  Procedure Laterality Date  . Abdominal hysterectomy      No current outpatient prescriptions on file.  Allergies Review of patient's allergies indicates not on file.  No family history on file.  Social History Social History  Substance Use Topics  . Smoking status: Current Every Day Smoker  . Smokeless tobacco: None  . Alcohol Use: No    Review of Systems  Constitutional: No fever/chills. No lightheadedness. No loss of consciousness. No syncope. Eyes: No visual changes. No blurred or double vision. ENT: No sore throat. No congestion or rhinorrhea. Cardiovascular: Denies chest pain. Denies palpitations. Respiratory: Positive chronic but unchanged shortness of breath.  Chronic cough. Gastrointestinal: No abdominal pain.  No  nausea, no vomiting.  No diarrhea.  No constipation. Genitourinary: Negative for dysuria. Musculoskeletal: Negative for back pain. Negative for neck pain. Skin: Negative for rash. Neurological: Negative for headaches. No focal numbness, tingling or weakness.   10-point ROS otherwise negative.  ____________________________________________   PHYSICAL EXAM:  VITAL SIGNS: ED Triage Vitals  Enc Vitals Group     BP 05/22/15 0810 192/86 mmHg     Pulse Rate 05/22/15 0810 74     Resp --      Temp --      Temp src --      SpO2 05/22/15 0810 71 %     Weight 05/22/15 0810 166 lb (75.297 kg)     Height 05/22/15 0810  (1.626 m)     Head Cir --      Peak Flow --      Pain Score --      Pain Loc --      Pain Edu? --      Excl. in GC? --     Constitutional: Patient is alert and oriented and answering questions appropriately. She is chronically ill-appearing but in no acute distress.  Eyes: Conjunctivae are normal.  EOMI. No scleral icterus. Head: Atraumatic. No raccoon eyes or Battle sign. Nose: No congestion/rhinnorhea. No no swelling or septal hematoma. Mouth/Throat: Mucous membranes are very dry. No malocclusion or dental injury. Neck: No stridor.  Supple.  No midline C-spine tenderness to palpation, step-offs or deformities. Patient holds her head to the left and states that this is chronic "I have to push it to the right to be able to  keep my head straight." Cardiovascular: Normal rate, regular rhythm. No murmurs, rubs or gallops.  Respiratory: Patient is mildly tachypnea with accessory muscle use but no retractions. Her O2 sats are 80% on 4 L nasal cannula and 86% on 6 L nasal cannula. She is able to obtain 100% on facemask at 10 L. Diffuse expiratory wheezing with prolonged expiratory phase. Poor air exchange in the left greater than right lung. Gastrointestinal: Soft, nontender and nondistended.  No guarding or rebound.  No peritoneal signs. Musculoskeletal: Positive bilateral  lower extremity edema that is symmetric. No ttp in the calves or palpable cords.  Negative Homan's sign. Full range of motion of the bilateral hips, knees, ankles without pain. Neurologic:  Alert and oriented 3 with clear speech. Face is symmetric. The patient holds her head in a left lateral gaze position. Extraocular movements are intact and PERRLA. Moves all extremities well.  Skin:  Skin is warm, dry and intact. No rash noted. Psychiatric: Mood and affect are normal. Speech and behavior are normal.  Normal judgement.  ____________________________________________   LABS (all labs ordered are listed, but only abnormal results are displayed)  Labs Reviewed  CBC - Abnormal; Notable for the following:    RBC 5.79 (*)    Hemoglobin 17.0 (*)    HCT 50.1 (*)    RDW 15.5 (*)    All other components within normal limits  BRAIN NATRIURETIC PEPTIDE - Abnormal; Notable for the following:    B Natriuretic Peptide 359.0 (*)    All other components within normal limits  BLOOD GAS, VENOUS - Abnormal; Notable for the following:    pCO2, Ven 76 (*)    pO2, Ven <31.0 (*)    Bicarbonate 46.0 (*)    Acid-Base Excess 15.9 (*)    All other components within normal limits  RAPID INFLUENZA A&B ANTIGENS ([facility] ONLY)  URINE CULTURE  CULTURE, BLOOD (ROUTINE X 2)  CULTURE, BLOOD (ROUTINE X 2)  LACTIC ACID, PLASMA  COMPREHENSIVE METABOLIC PANEL  TROPONIN I  LACTIC ACID, PLASMA  CK  URINALYSIS COMPLETEWITH MICROSCOPIC ([facility] ONLY)   ____________________________________________  EKG  ED ECG REPORT I, Rockne MenghiniNorman, Anne-Caroline, the attending physician, personally viewed and interpreted this ECG.   Date: 05/22/2015  EKG Time: 937  Rate: 67  Rhythm: Unable to confirm rhythm due to poor baseline tracing  Axis: Leftward  Intervals:none  ST&T Change: No obvious ST elevation but again the EKG is limited due to poor baseline tracing.  ____________________________________________  RADIOLOGY  Ct Head Wo  Contrast  05/22/2015  CLINICAL DATA:  Fall last evening.  Hypoxic and confusion.  COPD. EXAM: CT HEAD WITHOUT CONTRAST TECHNIQUE: Contiguous axial images were obtained from the base of the skull through the vertex without contrast. COMPARISON:  12/22/2007 FINDINGS: Subtle low-density in the subcortical white matter. There is focal white matter disease involving the left external capsule and the left white matter tracts. This could represent ischemic changes of unknown age. No evidence for acute hemorrhage, mass lesion, midline shift, hydrocephalus or large new infarct. No calvarial fracture. Visualized paranasal sinuses are clear. IMPRESSION: No acute intracranial abnormality. Age indeterminate white matter disease, particularly on the left side as described. Findings may represent old ischemic changes. Electronically Signed   By: Richarda OverlieAdam  Henn M.D.   On: 05/22/2015 09:23    ____________________________________________   PROCEDURES  Procedure(s) performed: None  Critical Care performed: Yes ____________________________________________   INITIAL IMPRESSION / ASSESSMENT AND PLAN / ED COURSE  Pertinent labs & imaging results that  were available during my care of the patient were reviewed by me and considered in my medical decision making (see chart for details).  75 y.o. female who has a history of COPD and is presenting for a fall in the night when she was unable to get up with associated hypoxia that improves only with facemask. The patient has multiple abnormal findings on her pulmonary exam which may be consistent with COPD exacerbation versus aspiration or pneumonia versus CHF. PE is also possible.  We will evaluate her for ACS or MI. At this time, for her trauma workup, we'll get a CT of the head. There is no evidence of any acute bony injuries on my exam but we'll continue to monitor her for this. The patient will likely require admission to the  hospital.  ----------------------------------------- 9:17 AM on 05/22/2015 -----------------------------------------  The patient continues to [facility] well and does have an oxygen level of 100 percent on facemask but she is persistently tachypneic.  Her gas shows a normal pH but she is significantly hypercarbic, side vast respiratory 2, and placed on BiPAP and we will reevaluate her gas proximally one hour.  The lab is called and she is hypokalemic and hyponatremic; they have requested a repeat sample in case this is a lab air. However, clinically, I'm concerned that these numbers are probably correct given how dehydrated she appears. Despite her BNP being slightly elevated, I will give her IV fluids because I think she really needs intravascular hydration at this time.  CRITICAL CARE Performed by: Rockne Menghini   Total critical care time: 45 minutes  Critical care time was exclusive of separately billable procedures and treating other patients.  Critical care was necessary to treat or prevent imminent or life-threatening deterioration.  Critical care was time spent personally by me on the following activities: development of treatment plan with patient and/or surrogate as well as nursing, discussions with consultants, evaluation of patient's response to treatment, examination of patient, obtaining history from patient or surrogate, ordering and performing treatments and interventions, ordering and review of laboratory studies, ordering and review of radiographic studies, pulse oximetry and re-evaluation of patient's condition.  ____________________________________________  FINAL CLINICAL IMPRESSION(S) / ED DIAGNOSES  Final diagnoses:  Fall, initial encounter  Hypercarbia  Hypoxia  Acute congestive heart failure, unspecified congestive heart failure type (HCC)  Hyponatremia  Dehydration      NEW MEDICATIONS STARTED DURING THIS VISIT:  New Prescriptions   No medications  on file     Rockne Menghini, MD 05/22/15 [phone]

## 2015-05-22 NOTE — Progress Notes (Signed)
LCSW review patients case notes and will follow up with patient tomorrow AM Candace Woods Candace Hart LCSW [phone]

## 2015-05-22 NOTE — ED Notes (Signed)
Ron from CT called and started that patients IV had been accidentally pulled out when patient was being transferred from stretcher to CT table. Patient only received about 125 mL of 250 mL bolus.

## 2015-05-22 NOTE — ED Notes (Signed)
Lab called with critical results on Potassium of 2.3 and Sodium of 114, lab asked for recollect to confirm levels are accurate.

## 2015-05-22 NOTE — ED Notes (Signed)
Patient sating at 88% on 5L via [place], patient changed to non-breather at 10L, O2 sats improved to 94%

## 2015-05-22 NOTE — ED Notes (Signed)
Candace StallionGeorge from RT at bedside putting patient on Bipap. Patient current NAD.

## 2015-05-22 NOTE — Progress Notes (Addendum)
ELECTROLYTE CONSULT NOTE - INITIAL  Pharmacy Consult for Electrolyte management    Patient Measurements: Height: 5\' 4"  (162.6 cm) Weight: 180 lb 12.4 oz (82 kg) IBW/kg (Calculated) : 54.7  Vital Signs: Temp: 98 F (36.7 C) (04/09 1321) Temp Source: Axillary (04/09 1321) BP: 191/102 mmHg (04/09 1400) Pulse Rate: 85 (04/09 1400)   Recent Labs  05/22/15 0821  WBC 9.3  HGB 17.0*  HCT 50.1*  PLT 189     Recent Labs  05/22/15 0821  NA 114*  K 2.3*  CL <65*  CO2 37*  GLUCOSE 165*  BUN 6  CREATININE 0.58  CALCIUM 9.0  MG 1.4*  PROT 8.0  ALBUMIN 4.3  AST 48*  ALT 18  ALKPHOS 165*  BILITOT 1.0   Estimated Creatinine Clearance: 62.9 mL/min (by C-G formula based on Cr of 0.58).    Recent Labs  05/22/15 1320  GLUCAP 174*   Assessment: 75 year old female admitted after fall secondary to weakness. Patient also has severe hyponatremia.     Pharmacy has been consulted for electrolyte management.   4/9 K: 2.3      Mg:  1.4   Plan:  Patient has orders for KCL 40mEq PO BID.  Will order KCL 10 mEq IV X4. Patient also have NS w/KCL going at 14000mL/hr.  Also will order magnesium 4gm IV x1.  Will recheck potassium at 2000 tonight and BMP and Mg with am labs.   Pharmacy will continue monitor and replace per consult.   Cher NakaiSheema Mylynn Dinh, PharmD Pharmacy Resident  05/22/2015,3:00 PM

## 2015-05-22 NOTE — ED Notes (Signed)
2 attempts made to place IV. Unsuccessful both attempts

## 2015-05-22 NOTE — Progress Notes (Signed)
[facility]  ROUNDING NOTE   Subjective:  The patient is a 75 year old Caucasian female with past medical history of COPD, hypertension, seizure disorder, peripheral neuropathy, anxiety, depression, history of psychogenic polydipsia who presented to [facility] with mechanical fall. Patient known to [place] from prior admissions as we saw her for hyponatremia in 2014. Serum sodium quite low at 114. Potassium also low at 2.3 as well as magnesium at 1.4. Patient reports that she's had rather poor by mouth intake at home. She only has been drinking Pepsi. She is also noted to be on Lasix. She was provided with 1 L of normal saline in the emergency department.  CT scan PE protocol reveals right-sided pneumonia.  Objective:  Vital signs in last 24 hours:  Temp:  [98 F (36.7 C)] 98 F (36.7 C) (04/09 1321) Pulse Rate:  [66-87] 85 (04/09 1400) Resp:  [15-27] 27 (04/09 1400) BP: (160-209)/(82-103) 191/102 mmHg (04/09 1400) SpO2:  [71 %-97 %] 97 % (04/09 1559) FiO2 (%):  [35 %] 35 % (04/09 1559) Weight:  [75.297 kg (166 lb)-82 kg (180 lb 12.4 oz)] 82 kg (180 lb 12.4 oz) (04/09 1321)  Weight change:  Filed Weights   05/22/15 0810 05/22/15 1321  Weight: 75.297 kg (166 lb) 82 kg (180 lb 12.4 oz)    Intake/Output:     Intake/Output this shift:     Physical Exam: General: Critically ill appearing on bipap  Head: Normocephalic, atraumatic. Moist oral mucosal membranes  Eyes: Anicteric, PERRL  Neck: Supple, trachea midline  Lungs:  Rales on the right, increased work of breathing, on bipap  Heart: S1S2 no rubs  Abdomen:  Soft, nontender, BS present   Extremities: no peripheral edema.  Neurologic: Nonfocal, moving all four extremities  Skin: No lesions       Basic Metabolic Panel:  Recent Labs Lab 05/22/15 0821  NA 114*  K 2.3*  CL <65*  CO2 37*  GLUCOSE 165*  BUN 6  CREATININE 0.58  CALCIUM 9.0  MG 1.4*    Liver Function Tests:  Recent  Labs Lab 05/22/15 0821  AST 48*  ALT 18  ALKPHOS 165*  BILITOT 1.0  PROT 8.0  ALBUMIN 4.3   No results for input(s): LIPASE, AMYLASE in the last 168 hours. No results for input(s): AMMONIA in the last 168 hours.  CBC:  Recent Labs Lab 05/22/15 0821  WBC 9.3  HGB 17.0*  HCT 50.1*  MCV 86.6  PLT 189    Cardiac Enzymes:  Recent Labs Lab 05/22/15 0821  CKTOTAL 772*  TROPONINI <0.03    BNP: Invalid input(s): POCBNP  CBG:  Recent Labs Lab 05/22/15 1320  GLUCAP 174*    Microbiology: Results for orders placed or performed during the hospital encounter of 05/22/15  Rapid Influenza A&B Antigens ([facility] only)     Status: None   Collection Time: 05/22/15  8:44 AM  Result Value Ref Range Status   Influenza A ([facility]) NEGATIVE NEGATIVE Final   Influenza B ([facility]) NEGATIVE NEGATIVE Final  Blood culture (routine x 2)     Status: None (Preliminary result)   Collection Time: 05/22/15  8:44 AM  Result Value Ref Range Status   Specimen Description BLOOD LEFT HAND  Final   Special Requests BOTTLES DRAWN AEROBIC AND ANAEROBIC  1CC  Final   Culture NO GROWTH < 12 HOURS  Final   Report Status PENDING  Incomplete  Blood culture (routine x 2)     Status: None (Preliminary  result)   Collection Time: 05/22/15  8:44 AM  Result Value Ref Range Status   Specimen Description BLOOD LEFT WRIST  Final   Special Requests BOTTLES DRAWN AEROBIC AND ANAEROBIC  1CC  Final   Culture NO GROWTH < 12 HOURS  Final   Report Status PENDING  Incomplete  Urine culture     Status: None (Preliminary result)   Collection Time: 05/22/15 11:31 AM  Result Value Ref Range Status   Specimen Description URINE, RANDOM  Final   Special Requests NONE  Final   Culture NO GROWTH <12 HOURS  Final   Report Status PENDING  Incomplete  MRSA PCR Screening     Status: None   Collection Time: 05/22/15  1:19 PM  Result Value Ref Range Status   MRSA by PCR NEGATIVE NEGATIVE Final    Comment:        The GeneXpert  MRSA Assay (FDA approved for NASAL specimens only), is one component of a comprehensive MRSA colonization surveillance program. It is not intended to diagnose MRSA infection nor to guide or monitor treatment for MRSA infections.     Coagulation Studies: No results for input(s): LABPROT, INR in the last 72 hours.  Urinalysis:  Recent Labs  05/22/15 1131  COLORURINE YELLOW*  LABSPEC 1.009  PHURINE 6.0  GLUCOSEU NEGATIVE  HGBUR 1+*  BILIRUBINUR NEGATIVE  KETONESUR NEGATIVE  PROTEINUR 30*  NITRITE NEGATIVE  LEUKOCYTESUR 1+*      Imaging: Ct Head Wo Contrast  05/22/2015  CLINICAL DATA:  Fall last evening.  Hypoxic and confusion.  COPD. EXAM: CT HEAD WITHOUT CONTRAST TECHNIQUE: Contiguous axial images were obtained from the base of the skull through the vertex without contrast. COMPARISON:  12/22/2007 FINDINGS: Subtle low-density in the subcortical white matter. There is focal white matter disease involving the left external capsule and the left white matter tracts. This could represent ischemic changes of unknown age. No evidence for acute hemorrhage, mass lesion, midline shift, hydrocephalus or large new infarct. No calvarial fracture. Visualized paranasal sinuses are clear. IMPRESSION: No acute intracranial abnormality. Age indeterminate white matter disease, particularly on the left side as described. Findings may represent old ischemic changes. Electronically Signed   By: Richarda Overlie M.D.   On: 05/22/2015 09:23   Ct Angio Chest Pe W/cm &/or Wo Cm  05/22/2015  CLINICAL DATA:  Recent fall with hypoxia and shortness of Breath EXAM: CT ANGIOGRAPHY CHEST WITH CONTRAST TECHNIQUE: Multidetector CT imaging of the chest was performed using the standard protocol during bolus administration of intravenous contrast. Multiplanar CT image reconstructions and MIPs were obtained to evaluate the vascular anatomy. CONTRAST:  75 mL Isovue 370. COMPARISON:  None. FINDINGS: The lungs are well aerated  bilaterally. Mild patchy infiltrate is noted in the right middle and right lower lobes. Some atelectatic changes are noted in the lingula. Diffuse emphysematous changes are seen with apical scarring on the right. Some chronic scarring in the right lower lobe is noted as well. The thoracic inlet is within normal limits. The thoracic aorta shows diffuse calcifications without aneurysmal dilatation or dissection. Atherosclerotic plaque is noted in the descending aorta. The pulmonary artery is well visualized and demonstrates a normal branching pattern. No filling defect is seen to suggest pulmonary embolism. Mild calcified hilar lymph nodes are seen consistent with prior granulomatous disease. No significant lymphadenopathy is seen. The visualized upper abdomen shows calcified splenic granulomas. No other focal abnormality is noted. The osseous structures are within normal limits. Review of the MIP images  confirms the above findings. IMPRESSION: No evidence of pulmonary emboli. Diffuse emphysematous changes and changes of prior granulomatous disease. Patchy infiltrate in the right middle and right lower lobes. Electronically Signed   By: Alcide Clever M.D.   On: 05/22/2015 15:16   US Venous Img Lower Unilateral Left  05/22/2015  CLINICAL DATA:  Left lower extremity edema. EXAM: Or the hips LOWER EXTREMITY VENOUS DOPPLER ULTRASOUND TECHNIQUE: Gray-scale sonography with graded compression, as well as color Doppler and duplex ultrasound were performed to evaluate the lower extremity deep venous systems from the level of the common femoral vein and including the common femoral, femoral, profunda femoral, popliteal and calf veins including the posterior tibial, peroneal and gastrocnemius veins when visible. The superficial great saphenous vein was also interrogated. Spectral Doppler was utilized to evaluate flow at rest and with distal augmentation maneuvers in the common femoral, femoral and popliteal veins. COMPARISON:   None. FINDINGS: Contralateral Common Femoral Vein: Respiratory phasicity is normal and symmetric with the symptomatic side. No evidence of thrombus. Normal compressibility. Common Femoral Vein: No evidence of thrombus. Normal compressibility, respiratory phasicity and response to augmentation. Saphenofemoral Junction: No evidence of thrombus. Normal compressibility and flow on color Doppler imaging. Profunda Femoral Vein: No evidence of thrombus. Normal compressibility and flow on color Doppler imaging. Femoral Vein: No evidence of thrombus. Normal compressibility, respiratory phasicity and response to augmentation. Popliteal Vein: No evidence of thrombus. Normal compressibility, respiratory phasicity and response to augmentation. Calf Veins: No evidence of thrombus. Normal compressibility and flow on color Doppler imaging. Superficial Great Saphenous Vein: No evidence of thrombus. Normal compressibility and flow on color Doppler imaging. Venous Reflux:  None. Other Findings:  None. IMPRESSION: No evidence of deep venous thrombosis. Electronically Signed   By: Amie Portland M.D.   On: 05/22/2015 15:37   Dg Chest Portable 1 View  05/22/2015  CLINICAL DATA:  75 year old who had an unwitnessed fall at her home last night. Current smoker with current history of COPD. EXAM: PORTABLE CHEST 1 VIEW COMPARISON:  CT chest 08/11/2013. Chest x-rays 09/11/2012 and earlier. FINDINGS: Cardiac silhouette upper normal in size to slightly enlarged, unchanged. Emphysematous changes throughout both lungs with prominent bronchovascular markings diffusely and mild central peribronchial thickening, unchanged. Mild atelectasis in the lung bases. Lungs otherwise clear. No pneumothorax. No visible pleural effusions. Osseous demineralization. IMPRESSION: COPD/emphysema. Mild bibasilar atelectasis. No acute cardiopulmonary disease otherwise. Electronically Signed   By: Hulan Saas M.D.   On: 05/22/2015 09:34     Medications:   .  0.9 % NaCl with KCl 20 mEq / L 100 mL/hr at 05/22/15 1401   . antiseptic oral rinse  7 mL Mouth Rinse q12n4p  . atenolol  25 mg Oral Daily  . [START ON 05/23/2015] azithromycin  250 mg Oral Daily  . budesonide (PULMICORT) nebulizer solution  0.25 mg Nebulization TID  . cefTRIAXone (ROCEPHIN)  IV  1 g Intravenous Q24H  . chlorhexidine  15 mL Mouth Rinse BID  . famotidine  20 mg Oral Daily  . gabapentin  100 mg Oral Daily  . heparin  5,000 Units Subcutaneous 3 times per day  . ipratropium-albuterol  3 mL Nebulization Q4H  . loratadine  10 mg Oral Daily  . magnesium sulfate 1 - 4 g bolus IVPB  4 g Intravenous Once  . [START ON 05/23/2015] methylPREDNISolone (SOLU-MEDROL) injection  60 mg Intravenous Q24H  . phenytoin  300 mg Oral QHS  . potassium chloride  40 mEq Oral Once  . potassium  chloride  10 mEq Intravenous Q1 Hr x 4  . sennosides-docusate sodium  1 tablet Oral Daily  . sertraline  100 mg Oral Daily  . simvastatin  20 mg Oral q1800  . sodium chloride flush  3 mL Intravenous Q12H  . tamsulosin  0.4 mg Oral Daily   acetaminophen **OR** acetaminophen, ALPRAZolam, baclofen, hydrALAZINE, labetalol, morphine injection, ondansetron **OR** ondansetron (ZOFRAN) IV, oxyCODONE  Assessment/ Plan:  75 y.o. female with past medical history of COPD, hypertension, seizure disorder, peripheral neuropathy, anxiety, depression, history of psychogenic polydipsia who presented to [facility] with mechanical fall. Patient known to us from prior admissions as we saw her for hyponatremia in 2014.   1.  Hyponatremia, with prior hx of psychogenic polydipsia.  2.  Acute respiratory failure. 3.  Hypokalemia. 4.  Hypomagnesemia.   Plan:  The patient presents in acute respiratory failure and is currently on BiPAP. We will consulted for evaluation management of hyponatremia. The patient has prior history of psychogenic polydipsia. However it does not appear that she has been drinking  excessive amounts of water as states that she's not been eating or drinking very well over the past several days. We will proceed with serum osmolality, repeat urine osmolality, cortisol, TSH for additional workup. We will attempt a trial of 0.9 normal saline at 60 cc per hour. Follow serum sodiums every 6 hours. Target sodium within the next 24 hours is 122. Continue BiPAP for her respiratory failure at the moment. Potassium is also being repleted. Thanks for consultation.   LOS: 0 Draya Felker 4/9/20174:08 PM

## 2015-05-23 LAB — BASIC METABOLIC PANEL
ANION GAP: 9 (ref 5–15)
BUN: 7 mg/dL (ref 6–20)
CO2: 33 mmol/L — ABNORMAL HIGH (ref 22–32)
Calcium: 8.1 mg/dL — ABNORMAL LOW (ref 8.9–10.3)
Chloride: 78 mmol/L — ABNORMAL LOW (ref 101–111)
Creatinine, Ser: 0.45 mg/dL (ref 0.44–1.00)
GFR calc Af Amer: >60 mL/min (ref >60)
Glucose, Bld: 98 mg/dL (ref 65–99)
POTASSIUM: 3.9 mmol/L (ref 3.5–5.1)
SODIUM: 120 mmol/L — AB (ref 135–145)

## 2015-05-23 LAB — CBC
HEMATOCRIT: 43.3 % (ref 35.0–47.0)
HEMOGLOBIN: 14.6 g/dL (ref 12.0–16.0)
MCH: 29.9 pg (ref 26.0–34.0)
MCHC: 33.8 g/dL (ref 32.0–36.0)
MCV: 88.5 fL (ref 80.0–100.0)
Platelets: 175 10*3/uL (ref 150–440)
RBC: 4.89 MIL/uL (ref 3.80–5.20)
RDW: 15.7 % — ABNORMAL HIGH (ref 11.5–14.5)
WBC: 12.5 10*3/uL — AB (ref 3.6–11.0)

## 2015-05-23 LAB — MAGNESIUM: Magnesium: 2.4 mg/dL (ref 1.7–2.4)

## 2015-05-23 LAB — CORTISOL

## 2015-05-23 MED ORDER — DEXTROSE 5 % IV SOLN
1.0000 g | INTRAVENOUS | Status: DC
  Administered 2015-05-23 – 2015-05-24 (×2): 1 g via INTRAVENOUS
  Filled 2015-05-23 (×3): qty 10

## 2015-05-23 MED ORDER — BUDESONIDE 0.5 MG/2ML IN SUSP
0.5000 mg | Freq: Two times a day (BID) | RESPIRATORY_TRACT | Status: DC
  Administered 2015-05-23 – 2015-05-24 (×3): 0.5 mg via RESPIRATORY_TRACT
  Filled 2015-05-23 (×4): qty 2

## 2015-05-23 MED ORDER — ENOXAPARIN SODIUM 40 MG/0.4ML ~~LOC~~ SOLN
40.0000 mg | SUBCUTANEOUS | Status: DC
  Administered 2015-05-23 – 2015-06-07 (×15): 40 mg via SUBCUTANEOUS
  Filled 2015-05-23 (×16): qty 0.4

## 2015-05-23 MED ORDER — IPRATROPIUM-ALBUTEROL 0.5-2.5 (3) MG/3ML IN SOLN
3.0000 mL | Freq: Four times a day (QID) | RESPIRATORY_TRACT | Status: DC
  Administered 2015-05-23 – 2015-06-04 (×34): 3 mL via RESPIRATORY_TRACT
  Filled 2015-05-23 (×40): qty 3

## 2015-05-23 MED ORDER — METHYLPREDNISOLONE SODIUM SUCC 125 MG IJ SOLR
60.0000 mg | Freq: Two times a day (BID) | INTRAMUSCULAR | Status: DC
  Administered 2015-05-23 (×2): 60 mg via INTRAVENOUS
  Filled 2015-05-23 (×2): qty 2

## 2015-05-23 MED ORDER — ALBUTEROL SULFATE (2.5 MG/3ML) 0.083% IN NEBU: 2.5000 mg | INHALATION_SOLUTION | RESPIRATORY_TRACT | Status: DC | PRN

## 2015-05-23 NOTE — Progress Notes (Signed)
Pt remained free from pain on my shift. Pt is still on Hi-Flow at this time at 30%. Pt is in stable condition at this time. Pt had bath and foley care on my shift.   Report given to oncoming nurse.

## 2015-05-23 NOTE — Progress Notes (Signed)
[facility]  ROUNDING NOTE   Subjective:  Serum sodium up to 120 today. Potassium also up to 3.9.   Objective:  Vital signs in last 24 hours:  Temp:  [97.7 F (36.5 C)-98.4 F (36.9 C)] 97.7 F (36.5 C) (04/10 0800) Pulse Rate:  [59-91] 80 (04/10 0955) Resp:  [11-27] 11 (04/10 0800) BP: (109-209)/(56-146) 133/77 mmHg (04/10 0955) SpO2:  [87 %-98 %] 97 % (04/10 0822) FiO2 (%):  [35 %-55 %] 54 % (04/10 0822) Weight:  [82 kg (180 lb 12.4 oz)] 82 kg (180 lb 12.4 oz) (04/09 1321)  Weight change:  Filed Weights   05/22/15 0810 05/22/15 1321  Weight: 75.297 kg (166 lb) 82 kg (180 lb 12.4 oz)    Intake/Output: I/O last 3 completed shifts: In: 490 [I.V.:390; IV Piggyback:100] Out: -    Intake/Output this shift:  Total I/O In: 78 [I.V.:78] Out: -   Physical Exam: General: No aucte distress  Head: Normocephalic, atraumatic. Moist oral mucosal membranes  Eyes: Anicteric  Neck: Supple, trachea midline  Lungs:  Rales on the right  Heart: S1S2 no rubs  Abdomen:  Soft, nontender, BS present   Extremities: no peripheral edema.  Neurologic: Nonfocal, moving all four extremities  Skin: No lesions       Basic Metabolic Panel:  Recent Labs Lab 05/22/15 0821 05/22/15 1643 05/22/15 2058 05/23/15 0442  NA 114* 116* 116* 120*  K 2.3*  --  2.4* 3.9  CL <65*  --  70* 78*  CO2 37*  --  35* 33*  GLUCOSE 165*  --  136* 98  BUN 6  --  6 7  CREATININE 0.58  --  0.50 0.45  CALCIUM 9.0  --  8.1* 8.1*  MG 1.4*  --   --  2.4    Liver Function Tests:  Recent Labs Lab 05/22/15 0821  AST 48*  ALT 18  ALKPHOS 165*  BILITOT 1.0  PROT 8.0  ALBUMIN 4.3   No results for input(s): LIPASE, AMYLASE in the last 168 hours. No results for input(s): AMMONIA in the last 168 hours.  CBC:  Recent Labs Lab 05/22/15 0821 05/23/15 0442  WBC 9.3 12.5*  HGB 17.0* 14.6  HCT 50.1* 43.3  MCV 86.6 88.5  PLT 189 175    Cardiac Enzymes:  Recent Labs Lab  05/22/15 0821  CKTOTAL 772*  TROPONINI <0.03    BNP: Invalid input(s): POCBNP  CBG:  Recent Labs Lab 05/22/15 1320  GLUCAP 174*    Microbiology: Results for orders placed or performed during the hospital encounter of 05/22/15  Rapid Influenza A&B Antigens ([facility] only)     Status: None   Collection Time: 05/22/15  8:44 AM  Result Value Ref Range Status   Influenza A ([facility]) NEGATIVE NEGATIVE Final   Influenza B ([facility]) NEGATIVE NEGATIVE Final  Blood culture (routine x 2)     Status: None (Preliminary result)   Collection Time: 05/22/15  8:44 AM  Result Value Ref Range Status   Specimen Description BLOOD LEFT HAND  Final   Special Requests BOTTLES DRAWN AEROBIC AND ANAEROBIC  1CC  Final   Culture NO GROWTH < 24 HOURS  Final   Report Status PENDING  Incomplete  Blood culture (routine x 2)     Status: None (Preliminary result)   Collection Time: 05/22/15  8:44 AM  Result Value Ref Range Status   Specimen Description BLOOD LEFT WRIST  Final   Special Requests BOTTLES DRAWN AEROBIC AND ANAEROBIC  1CC  Final   Culture NO GROWTH < 24 HOURS  Final   Report Status PENDING  Incomplete  Urine culture     Status: Abnormal (Preliminary result)   Collection Time: 05/22/15 11:31 AM  Result Value Ref Range Status   Specimen Description URINE, RANDOM  Final   Special Requests NONE  Final   Culture (A)  Final    >=100,000 COLONIES/mL GRAM NEGATIVE RODS IDENTIFICATION AND SUSCEPTIBILITIES TO FOLLOW    Report Status PENDING  Incomplete  MRSA PCR Screening     Status: None   Collection Time: 05/22/15  1:19 PM  Result Value Ref Range Status   MRSA by PCR NEGATIVE NEGATIVE Final    Comment:        The GeneXpert MRSA Assay (FDA approved for NASAL specimens only), is one component of a comprehensive MRSA colonization surveillance program. It is not intended to diagnose MRSA infection nor to guide or monitor treatment for MRSA infections.     Coagulation Studies: No results for  input(s): LABPROT, INR in the last 72 hours.  Urinalysis:  Recent Labs  05/22/15 1131  COLORURINE YELLOW*  LABSPEC 1.009  PHURINE 6.0  GLUCOSEU NEGATIVE  HGBUR 1+*  BILIRUBINUR NEGATIVE  KETONESUR NEGATIVE  PROTEINUR 30*  NITRITE NEGATIVE  LEUKOCYTESUR 1+*      Imaging: Ct Head Wo Contrast  05/22/2015  CLINICAL DATA:  Fall last evening.  Hypoxic and confusion.  COPD. EXAM: CT HEAD WITHOUT CONTRAST TECHNIQUE: Contiguous axial images were obtained from the base of the skull through the vertex without contrast. COMPARISON:  12/22/2007 FINDINGS: Subtle low-density in the subcortical white matter. There is focal white matter disease involving the left external capsule and the left white matter tracts. This could represent ischemic changes of unknown age. No evidence for acute hemorrhage, mass lesion, midline shift, hydrocephalus or large new infarct. No calvarial fracture. Visualized paranasal sinuses are clear. IMPRESSION: No acute intracranial abnormality. Age indeterminate white matter disease, particularly on the left side as described. Findings may represent old ischemic changes. Electronically Signed   By: Richarda Overlie M.D.   On: 05/22/2015 09:23   Ct Angio Chest Pe W/cm &/or Wo Cm  05/22/2015  CLINICAL DATA:  Recent fall with hypoxia and shortness of Breath EXAM: CT ANGIOGRAPHY CHEST WITH CONTRAST TECHNIQUE: Multidetector CT imaging of the chest was performed using the standard protocol during bolus administration of intravenous contrast. Multiplanar CT image reconstructions and MIPs were obtained to evaluate the vascular anatomy. CONTRAST:  75 mL Isovue 370. COMPARISON:  None. FINDINGS: The lungs are well aerated bilaterally. Mild patchy infiltrate is noted in the right middle and right lower lobes. Some atelectatic changes are noted in the lingula. Diffuse emphysematous changes are seen with apical scarring on the right. Some chronic scarring in the right lower lobe is noted as well. The  thoracic inlet is within normal limits. The thoracic aorta shows diffuse calcifications without aneurysmal dilatation or dissection. Atherosclerotic plaque is noted in the descending aorta. The pulmonary artery is well visualized and demonstrates a normal branching pattern. No filling defect is seen to suggest pulmonary embolism. Mild calcified hilar lymph nodes are seen consistent with prior granulomatous disease. No significant lymphadenopathy is seen. The visualized upper abdomen shows calcified splenic granulomas. No other focal abnormality is noted. The osseous structures are within normal limits. Review of the MIP images confirms the above findings. IMPRESSION: No evidence of pulmonary emboli. Diffuse emphysematous changes and changes of prior granulomatous disease. Patchy infiltrate in the  right middle and right lower lobes. Electronically Signed   By: Alcide Clever M.D.   On: 05/22/2015 15:16   US Venous Img Lower Unilateral Left  05/22/2015  CLINICAL DATA:  Left lower extremity edema. EXAM: Or the hips LOWER EXTREMITY VENOUS DOPPLER ULTRASOUND TECHNIQUE: Gray-scale sonography with graded compression, as well as color Doppler and duplex ultrasound were performed to evaluate the lower extremity deep venous systems from the level of the common femoral vein and including the common femoral, femoral, profunda femoral, popliteal and calf veins including the posterior tibial, peroneal and gastrocnemius veins when visible. The superficial great saphenous vein was also interrogated. Spectral Doppler was utilized to evaluate flow at rest and with distal augmentation maneuvers in the common femoral, femoral and popliteal veins. COMPARISON:  None. FINDINGS: Contralateral Common Femoral Vein: Respiratory phasicity is normal and symmetric with the symptomatic side. No evidence of thrombus. Normal compressibility. Common Femoral Vein: No evidence of thrombus. Normal compressibility, respiratory phasicity and response to  augmentation. Saphenofemoral Junction: No evidence of thrombus. Normal compressibility and flow on color Doppler imaging. Profunda Femoral Vein: No evidence of thrombus. Normal compressibility and flow on color Doppler imaging. Femoral Vein: No evidence of thrombus. Normal compressibility, respiratory phasicity and response to augmentation. Popliteal Vein: No evidence of thrombus. Normal compressibility, respiratory phasicity and response to augmentation. Calf Veins: No evidence of thrombus. Normal compressibility and flow on color Doppler imaging. Superficial Great Saphenous Vein: No evidence of thrombus. Normal compressibility and flow on color Doppler imaging. Venous Reflux:  None. Other Findings:  None. IMPRESSION: No evidence of deep venous thrombosis. Electronically Signed   By: Amie Portland M.D.   On: 05/22/2015 15:37   Dg Chest Portable 1 View  05/22/2015  CLINICAL DATA:  75 year old who had an unwitnessed fall at her home last night. Current smoker with current history of COPD. EXAM: PORTABLE CHEST 1 VIEW COMPARISON:  CT chest 08/11/2013. Chest x-rays 09/11/2012 and earlier. FINDINGS: Cardiac silhouette upper normal in size to slightly enlarged, unchanged. Emphysematous changes throughout both lungs with prominent bronchovascular markings diffusely and mild central peribronchial thickening, unchanged. Mild atelectasis in the lung bases. Lungs otherwise clear. No pneumothorax. No visible pleural effusions. Osseous demineralization. IMPRESSION: COPD/emphysema. Mild bibasilar atelectasis. No acute cardiopulmonary disease otherwise. Electronically Signed   By: Hulan Saas M.D.   On: 05/22/2015 09:34     Medications:   . 0.9 % NaCl with KCl 40 mEq / L 75 mL/hr at 05/23/15 0400   . atenolol  25 mg Oral Daily  . budesonide (PULMICORT) nebulizer solution  0.5 mg Nebulization BID  . cefTRIAXone (ROCEPHIN)  IV  1 g Intravenous Q24H  . enoxaparin (LOVENOX) injection  40 mg Subcutaneous Q24H  .  famotidine  20 mg Oral Daily  . gabapentin  100 mg Oral Daily  . ipratropium-albuterol  3 mL Nebulization Q6H  . methylPREDNISolone (SOLU-MEDROL) injection  60 mg Intravenous Q12H  . phenytoin  300 mg Oral QHS  . sennosides-docusate sodium  1 tablet Oral Daily  . sertraline  100 mg Oral Daily  . sodium chloride flush  3 mL Intravenous Q12H   acetaminophen **OR** acetaminophen, albuterol, ALPRAZolam, baclofen, hydrALAZINE, morphine injection, ondansetron **OR** ondansetron (ZOFRAN) IV, oxyCODONE  Assessment/ Plan:  75 y.o. female with past medical history of COPD, hypertension, seizure disorder, peripheral neuropathy, anxiety, depression, history of psychogenic polydipsia who presented to [facility] with mechanical fall. Patient known to [place] from prior admissions as we saw her for hyponatremia in 2014.  1.  Hyponatremia, with prior hx of psychogenic polydipsia.  2.  Acute respiratory failure. 3.  Hypokalemia. 4.  Hypomagnesemia.   Plan:  Urine osmolality goes against primary polydipsia. Patient was not eating and drinking very well several days prior to admission which is likely secondary to her pneumonia. Since serum sodium is rising we recommend continued IV fluid hydration with 0.9 normal saline. If sodium stabilizes we may need to consider that she may have an element of SIADH as well. Continue respiratory support with high flow nasal cannula. Serum potassium has also improved this a.m. We will continue to monitor her progress along with you.   LOS: 1 Ardath Lepak 4/10/201710:37 AM

## 2015-05-23 NOTE — Progress Notes (Signed)
LCSW met with patient this morning and she stated she will not go to a Skilled nursing facility. She reports she has home health at her home 7 days a week and reports they come in on Tuesday and Thursdays only for 3 hours. Patient was oriented to person place and time. LCSW consulted Nan from ICU on patients position.  [employer] LCSW [place] LCSW [phone]

## 2015-05-23 NOTE — Progress Notes (Signed)
[facility] [place] [facility]                                                                                                                                                                                            Patient Demographics   Candace ChaletDonna Bumgarner, is a 75 y.o. female, DOB - Nov 05, 1940, UJW:[id number]  Admit date - 05/22/2015   Admitting Physician Wyatt Hasteavid K Hower, MD  Outpatient Primary MD for the patient is Pcp Not In System   LOS - 1  Subjective: Patient admitted with acute respiratory failure, on high flow nasal cannula currently Denies any chest pains Continues to complain of shortness of breath   Review of Systems:   CONSTITUTIONAL: No documented fever. No fatigue, weakness. No weight gain, no weight loss.  EYES: No blurry or double vision.  ENT: No tinnitus. No postnasal drip. No redness of the oropharynx.  RESPIRATORY: Positive dry cough, no wheeze, no hemoptysis. Positive dyspnea.  CARDIOVASCULAR: No chest pain. No orthopnea. No palpitations. No syncope.  GASTROINTESTINAL: No nausea, no vomiting or diarrhea. No abdominal pain. No melena or hematochezia.  GENITOURINARY: No dysuria or hematuria.  ENDOCRINE: No polyuria or nocturia. No heat or cold intolerance.  HEMATOLOGY: No anemia. No bruising. No bleeding.  INTEGUMENTARY: No rashes. No lesions.  MUSCULOSKELETAL: No arthritis. No swelling. No gout.  NEUROLOGIC: No numbness, tingling, or ataxia. No seizure-type activity.  PSYCHIATRIC: No anxiety. No insomnia. No ADD.    Vitals:   Filed Vitals:   05/23/15 0700 05/23/15 0800 05/23/15 0822 05/23/15 0955  BP: 125/71 128/70  133/77  Pulse: 68 64  80  Temp:  97.7 F (36.5 C)    TempSrc:  Oral    Resp: 13 11    Height:      Weight:      SpO2: 95% 96% 97%     Wt Readings from Last 3 Encounters:  05/22/15 82 kg (180 lb 12.4 oz)     Intake/Output Summary (Last 24 hours) at 05/23/15 1003 Last data filed at 05/23/15 1001  Gross per 24  hour  Intake    568 ml  Output      0 ml  Net    568 ml    Physical Exam:   GENERAL: Critically ill-appearing I flow nasal cannula HEAD, EYES, EARS, NOSE AND THROAT: Atraumatic, normocephalic. Extraocular muscles are intact. Pupils equal and reactive to light. Sclerae anicteric. No conjunctival injection. No oro-pharyngeal erythema.  NECK: Supple. There is no jugular venous distention. No bruits, no lymphadenopathy, no thyromegaly.  HEART: Regular rate and rhythm,. No murmurs, no rubs, no clicks.  LUNGS: Diminished breath sounds bilaterally with some associated muscle usage.  ABDOMEN: Soft, flat, nontender, nondistended. Has good bowel sounds. No hepatosplenomegaly appreciated.  EXTREMITIES: No evidence of any cyanosis, clubbing, or peripheral edema.  +2 pedal and radial pulses bilaterally.  NEUROLOGIC: The patient is alert, awake, and oriented x3 with no focal motor or sensory deficits appreciated bilaterally. Has neck Torticollis SKIN: Moist and warm with no rashes appreciated.  Psych: Not anxious, depressed LN: No inguinal LN enlargement    Antibiotics   Anti-infectives    Start     Dose/Rate Route Frequency Ordered Stop   05/23/15 1000  azithromycin (ZITHROMAX) tablet 250 mg  Status:  Discontinued     250 mg Oral Daily 05/22/15 1431 05/23/15 0905   05/22/15 1630  cefTRIAXone (ROCEPHIN) 1 g in dextrose 5 % 50 mL IVPB     1 g 100 mL/hr over 30 Minutes Intravenous Every 24 hours 05/22/15 1553     05/22/15 1445  azithromycin (ZITHROMAX) tablet 500 mg     500 mg Oral Daily 05/22/15 1431 05/22/15 1606      Medications   Scheduled Meds: . atenolol  25 mg Oral Daily  . budesonide (PULMICORT) nebulizer solution  0.5 mg Nebulization BID  . cefTRIAXone (ROCEPHIN)  IV  1 g Intravenous Q24H  . enoxaparin (LOVENOX) injection  40 mg Subcutaneous Q24H  . famotidine  20 mg Oral Daily  . gabapentin  100 mg Oral Daily  . ipratropium-albuterol  3 mL Nebulization Q6H  .  methylPREDNISolone (SOLU-MEDROL) injection  60 mg Intravenous Q12H  . phenytoin  300 mg Oral QHS  . sennosides-docusate sodium  1 tablet Oral Daily  . sertraline  100 mg Oral Daily  . sodium chloride flush  3 mL Intravenous Q12H   Continuous Infusions: . 0.9 % NaCl with KCl 40 mEq / L 75 mL/hr at 05/23/15 0400   PRN Meds:.acetaminophen **OR** acetaminophen, albuterol, ALPRAZolam, baclofen, hydrALAZINE, morphine injection, ondansetron **OR** ondansetron (ZOFRAN) IV, oxyCODONE   Data Review:   Micro Results Recent Results (from the past 240 hour(s))  Rapid Influenza A&B Antigens ([facility] only)     Status: None   Collection Time: 05/22/15  8:44 AM  Result Value Ref Range Status   Influenza A ([facility]) NEGATIVE NEGATIVE Final   Influenza B ([facility]) NEGATIVE NEGATIVE Final  Blood culture (routine x 2)     Status: None (Preliminary result)   Collection Time: 05/22/15  8:44 AM  Result Value Ref Range Status   Specimen Description BLOOD LEFT HAND  Final   Special Requests BOTTLES DRAWN AEROBIC AND ANAEROBIC  1CC  Final   Culture NO GROWTH < 24 HOURS  Final   Report Status PENDING  Incomplete  Blood culture (routine x 2)     Status: None (Preliminary result)   Collection Time: 05/22/15  8:44 AM  Result Value Ref Range Status   Specimen Description BLOOD LEFT WRIST  Final   Special Requests BOTTLES DRAWN AEROBIC AND ANAEROBIC  1CC  Final   Culture NO GROWTH < 24 HOURS  Final   Report Status PENDING  Incomplete  Urine culture     Status: Abnormal (Preliminary result)   Collection Time: 05/22/15 11:31 AM  Result Value Ref Range Status   Specimen Description URINE, RANDOM  Final   Special Requests NONE  Final   Culture (A)  Final    >=100,000 COLONIES/mL GRAM NEGATIVE RODS IDENTIFICATION AND SUSCEPTIBILITIES TO FOLLOW    Report Status PENDING  Incomplete  MRSA  PCR Screening     Status: None   Collection Time: 05/22/15  1:19 PM  Result Value Ref Range Status   MRSA by PCR NEGATIVE NEGATIVE  Final    Comment:        The GeneXpert MRSA Assay (FDA approved for NASAL specimens only), is one component of a comprehensive MRSA colonization surveillance program. It is not intended to diagnose MRSA infection nor to guide or monitor treatment for MRSA infections.     Radiology Reports Ct Head Wo Contrast  05/22/2015  CLINICAL DATA:  Fall last evening.  Hypoxic and confusion.  COPD. EXAM: CT HEAD WITHOUT CONTRAST TECHNIQUE: Contiguous axial images were obtained from the base of the skull through the vertex without contrast. COMPARISON:  12/22/2007 FINDINGS: Subtle low-density in the subcortical white matter. There is focal white matter disease involving the left external capsule and the left white matter tracts. This could represent ischemic changes of unknown age. No evidence for acute hemorrhage, mass lesion, midline shift, hydrocephalus or large new infarct. No calvarial fracture. Visualized paranasal sinuses are clear. IMPRESSION: No acute intracranial abnormality. Age indeterminate white matter disease, particularly on the left side as described. Findings may represent old ischemic changes. Electronically Signed   By: Richarda Overlie M.D.   On: 05/22/2015 09:23   Ct Angio Chest Pe W/cm &/or Wo Cm  05/22/2015  CLINICAL DATA:  Recent fall with hypoxia and shortness of Breath EXAM: CT ANGIOGRAPHY CHEST WITH CONTRAST TECHNIQUE: Multidetector CT imaging of the chest was performed using the standard protocol during bolus administration of intravenous contrast. Multiplanar CT image reconstructions and MIPs were obtained to evaluate the vascular anatomy. CONTRAST:  75 mL Isovue 370. COMPARISON:  None. FINDINGS: The lungs are well aerated bilaterally. Mild patchy infiltrate is noted in the right middle and right lower lobes. Some atelectatic changes are noted in the lingula. Diffuse emphysematous changes are seen with apical scarring on the right. Some chronic scarring in the right lower lobe is noted as  well. The thoracic inlet is within normal limits. The thoracic aorta shows diffuse calcifications without aneurysmal dilatation or dissection. Atherosclerotic plaque is noted in the descending aorta. The pulmonary artery is well visualized and demonstrates a normal branching pattern. No filling defect is seen to suggest pulmonary embolism. Mild calcified hilar lymph nodes are seen consistent with prior granulomatous disease. No significant lymphadenopathy is seen. The visualized upper abdomen shows calcified splenic granulomas. No other focal abnormality is noted. The osseous structures are within normal limits. Review of the MIP images confirms the above findings. IMPRESSION: No evidence of pulmonary emboli. Diffuse emphysematous changes and changes of prior granulomatous disease. Patchy infiltrate in the right middle and right lower lobes. Electronically Signed   By: Alcide Clever M.D.   On: 05/22/2015 15:16   US Venous Img Lower Unilateral Left  05/22/2015  CLINICAL DATA:  Left lower extremity edema. EXAM: Or the hips LOWER EXTREMITY VENOUS DOPPLER ULTRASOUND TECHNIQUE: Gray-scale sonography with graded compression, as well as color Doppler and duplex ultrasound were performed to evaluate the lower extremity deep venous systems from the level of the common femoral vein and including the common femoral, femoral, profunda femoral, popliteal and calf veins including the posterior tibial, peroneal and gastrocnemius veins when visible. The superficial great saphenous vein was also interrogated. Spectral Doppler was utilized to evaluate flow at rest and with distal augmentation maneuvers in the common femoral, femoral and popliteal veins. COMPARISON:  None. FINDINGS: Contralateral Common Femoral Vein: Respiratory phasicity is normal  and symmetric with the symptomatic side. No evidence of thrombus. Normal compressibility. Common Femoral Vein: No evidence of thrombus. Normal compressibility, respiratory phasicity and  response to augmentation. Saphenofemoral Junction: No evidence of thrombus. Normal compressibility and flow on color Doppler imaging. Profunda Femoral Vein: No evidence of thrombus. Normal compressibility and flow on color Doppler imaging. Femoral Vein: No evidence of thrombus. Normal compressibility, respiratory phasicity and response to augmentation. Popliteal Vein: No evidence of thrombus. Normal compressibility, respiratory phasicity and response to augmentation. Calf Veins: No evidence of thrombus. Normal compressibility and flow on color Doppler imaging. Superficial Great Saphenous Vein: No evidence of thrombus. Normal compressibility and flow on color Doppler imaging. Venous Reflux:  None. Other Findings:  None. IMPRESSION: No evidence of deep venous thrombosis. Electronically Signed   By: Amie Portland M.D.   On: 05/22/2015 15:37   Dg Chest Portable 1 View  05/22/2015  CLINICAL DATA:  75 year old who had an unwitnessed fall at her home last night. Current smoker with current history of COPD. EXAM: PORTABLE CHEST 1 VIEW COMPARISON:  CT chest 08/11/2013. Chest x-rays 09/11/2012 and earlier. FINDINGS: Cardiac silhouette upper normal in size to slightly enlarged, unchanged. Emphysematous changes throughout both lungs with prominent bronchovascular markings diffusely and mild central peribronchial thickening, unchanged. Mild atelectasis in the lung bases. Lungs otherwise clear. No pneumothorax. No visible pleural effusions. Osseous demineralization. IMPRESSION: COPD/emphysema. Mild bibasilar atelectasis. No acute cardiopulmonary disease otherwise. Electronically Signed   By: Hulan Saas M.D.   On: 05/22/2015 09:34     CBC  Recent Labs Lab 05/22/15 0821 05/23/15 0442  WBC 9.3 12.5*  HGB 17.0* 14.6  HCT 50.1* 43.3  PLT 189 175  MCV 86.6 88.5  MCH 29.4 29.9  MCHC 34.0 33.8  RDW 15.5* 15.7*    Chemistries   Recent Labs Lab 05/22/15 0821 05/22/15 1643 05/22/15 2058 05/23/15 0442  NA  114* 116* 116* 120*  K 2.3*  --  2.4* 3.9  CL <65*  --  70* 78*  CO2 37*  --  35* 33*  GLUCOSE 165*  --  136* 98  BUN 6  --  6 7  CREATININE 0.58  --  0.50 0.45  CALCIUM 9.0  --  8.1* 8.1*  MG 1.4*  --   --  2.4  AST 48*  --   --   --   ALT 18  --   --   --   ALKPHOS 165*  --   --   --   BILITOT 1.0  --   --   --    ------------------------------------------------------------------------------------------------------------------ estimated creatinine clearance is 62.9 mL/min (by C-G formula based on Cr of 0.45). ------------------------------------------------------------------------------------------------------------------ No results for input(s): HGBA1C in the last 72 hours. ------------------------------------------------------------------------------------------------------------------ No results for input(s): CHOL, HDL, LDLCALC, TRIG, CHOLHDL, LDLDIRECT in the last 72 hours. ------------------------------------------------------------------------------------------------------------------  Recent Labs  05/22/15 2058  TSH 1.005   ------------------------------------------------------------------------------------------------------------------ No results for input(s): VITAMINB12, FOLATE, FERRITIN, TIBC, IRON, RETICCTPCT in the last 72 hours.  Coagulation profile No results for input(s): INR, PROTIME in the last 168 hours.  No results for input(s): DDIMER in the last 72 hours.  Cardiac Enzymes  Recent Labs Lab 05/22/15 0821  TROPONINI <0.03   ------------------------------------------------------------------------------------------------------------------ Invalid input(s): POCBNP    Assessment & Plan   75 year old Caucasian female history of COPD non-option requiring presenting with mechanical fall and hypoxia  1. Acute on chronic respiratory failure with hypoxia:  This is due to acute on chronic COPD exasperation as well as  right sided pneumonia Continue high  flow nasal cannula Appreciate pulmonary input Continue IV antibiotics with ceftriaxone Continue IV Solu-Medrol Nebulizers  2. Lower extremity edema: Doppler reviewed negative for DVT   3. Hyponatremia: Severe total  Appreciate nephrology input  due to likely excessive fluid ingestion Monitor BMP tomorrow  4. Hypokalemia being replaced, magnesium normal  5. Elevated CK without kidney injury: IV fluid hydration repeat CPK in the morning  6. Venous thromboembolism prophylactic: Heparin subcutaneous  7. CODE STATUS full       Code Status Orders        Start     Ordered   05/22/15 1448  Do not attempt resuscitation (DNR)   Continuous    Question Answer Comment  In the event of cardiac or respiratory ARREST Do not call a "code blue"   In the event of cardiac or respiratory ARREST Do not perform Intubation, CPR, defibrillation or ACLS   In the event of cardiac or respiratory ARREST Use medication by any route, position, wound care, and other measures to relive pain and suffering. May use oxygen, suction and manual treatment of airway obstruction as needed for comfort.      05/22/15 1447    Code Status History    Date Active Date Inactive Code Status Order ID Comments User Context   05/22/2015 11:15 AM 05/22/2015  2:47 PM Full Code [id number]  Wyatt Haste, MD ED           Consults  none   DVT Prophylaxis  Lovenox   Lab Results  Component Value Date   PLT 175 05/23/2015     Time Spent in minutes  critical care time  Greater than 50% of time spent in care coordination and counseling patient regarding the condition and plan of care.   Auburn Bilberry M.D on 05/23/2015 at 10:03 AM  Between 7am to 6pm - Pager - [phone]  After 6pm go to [email or web address] - password [email or web address] [facility]  [facility]   Office  [phone]

## 2015-05-23 NOTE — Care Management (Signed)
Patient admitted to stepdown icu due to need for continuous bipap.  She is currently on HFNC.  She presents from home.  Currently enrolled with [facility].  Patient says that she does not have home 02.  She will need to be assessed for need of continuous home 02 at discharge.  She says she has in home aide on Tuesdays and Thursdays- in the morning and in the evening- (1 hour in the morning and 2 hours in the evening).  [facility].  Her pcp from [facility] is Dr Victory Dakiniley

## 2015-05-23 NOTE — Progress Notes (Signed)
ELECTROLYTE CONSULT NOTE - INITIAL  Pharmacy Consult for Electrolyte management    Patient Measurements: Height: 5\' 4"  (162.6 cm) Weight: 180 lb 12.4 oz (82 kg) IBW/kg (Calculated) : 54.7  Vital Signs: Temp: 97.7 F (36.5 C) (04/10 0000) Temp Source: Oral (04/10 0000) BP: 147/103 mmHg (04/10 0000) Pulse Rate: 71 (04/10 0000)   Recent Labs  05/22/15 0821  WBC 9.3  HGB 17.0*  HCT 50.1*  PLT 189     Recent Labs  05/22/15 0821 05/22/15 1643 05/22/15 2058  NA 114* 116* 116*  K 2.3*  --  2.4*  CL <65*  --  70*  CO2 37*  --  35*  GLUCOSE 165*  --  136*  BUN 6  --  6  CREATININE 0.58  --  0.50  CALCIUM 9.0  --  8.1*  MG 1.4*  --   --   PROT 8.0  --   --   ALBUMIN 4.3  --   --   AST 48*  --   --   ALT 18  --   --   ALKPHOS 165*  --   --   BILITOT 1.0  --   --    Estimated Creatinine Clearance: 62.9 mL/min (by C-G formula based on Cr of 0.5).    Recent Labs  05/22/15 1320  GLUCAP 174*   Assessment: 75 year old female admitted after fall secondary to weakness. Patient also has severe hyponatremia.     Pharmacy has been consulted for electrolyte management.   4/9 K: 2.3      Mg:  1.4   Plan:  Patient has orders for KCL 40mEq PO BID.  Will order KCL 10 mEq IV X4. Patient also have NS w/KCL going at 17300mL/hr.  Also will order magnesium 4gm IV x1.  Will recheck potassium at 2000 tonight and BMP and Mg with am labs.   4/9 PM K+ 2.4. Supplementation orders entered by CCM NP. Recheck BMP in AM.  Pharmacy will continue monitor and replace per consult.   Cher NakaiSheema Hallaji, PharmD Pharmacy Resident  05/23/2015,12:28 AM

## 2015-05-23 NOTE — Plan of Care (Signed)
Problem: Education: Goal: Knowledge of [facility] Education information/materials will improve Outcome: Completed/Met Date Met:  05/23/15 Pt was given welcome packet. Oriented to unit and call light provided. Pt's questions were answered. Pt was provided basic education on disease process and plan of care at this time.

## 2015-05-23 NOTE — Progress Notes (Signed)
No overt distress on [place] O2. No new complaints. Poorly oriented  Filed Vitals:   05/23/15 0800 05/23/15 0822 05/23/15 0955 05/23/15 1132  BP: 128/70  133/77   Pulse: 64  80   Temp: 97.7 F (36.5 C)     TempSrc: Oral     Resp: 11     Height:      Weight:      SpO2: 96% 97%  100%   HFNC 50%  Very frail HEENT WNL No JVD Diffuse 2 wheezes Reg, no M NABS, soft No edema  BMP Latest Ref Rng 05/23/2015 05/22/2015 05/22/2015  Glucose 65 - 99 mg/dL 98 161(W) -  BUN 6 - 20 mg/dL 7 6 -  Creatinine 9.60 - 1.00 mg/dL 4.54 0.98 -  Sodium 119 - 145 mmol/L 120(L) 116(LL) 116(LL)  Potassium 3.5 - 5.1 mmol/L 3.9 2.4(LL) -  Chloride 101 - 111 mmol/L 78(L) 70(L) -  CO2 22 - 32 mmol/L 33(H) 35(H) -  Calcium 8.9 - 10.3 mg/dL 8.1(L) 8.1(L) -    CBC Latest Ref Rng 05/23/2015 05/22/2015 09/06/2012  WBC 3.6 - 11.0 K/uL 12.5(H) 9.3 6.7  Hemoglobin 12.0 - 16.0 g/dL 14.7 17.0(H) 12.3  Hematocrit 35.0 - 47.0 % 43.3 50.1(H) 34.7(L)  Platelets 150 - 440 K/uL 175 189 271   Results for orders placed or performed during the hospital encounter of 05/22/15  Rapid Influenza A&B Antigens ([facility] only)     Status: None   Collection Time: 05/22/15  8:44 AM  Result Value Ref Range Status   Influenza A ([facility]) NEGATIVE NEGATIVE Final   Influenza B ([facility]) NEGATIVE NEGATIVE Final  Blood culture (routine x 2)     Status: None (Preliminary result)   Collection Time: 05/22/15  8:44 AM  Result Value Ref Range Status   Specimen Description BLOOD LEFT HAND  Final   Special Requests BOTTLES DRAWN AEROBIC AND ANAEROBIC  1CC  Final   Culture NO GROWTH < 24 HOURS  Final   Report Status PENDING  Incomplete  Blood culture (routine x 2)     Status: None (Preliminary result)   Collection Time: 05/22/15  8:44 AM  Result Value Ref Range Status   Specimen Description BLOOD LEFT WRIST  Final   Special Requests BOTTLES DRAWN AEROBIC AND ANAEROBIC  1CC  Final   Culture NO GROWTH < 24 HOURS  Final   Report Status PENDING  Incomplete   Urine culture     Status: Abnormal (Preliminary result)   Collection Time: 05/22/15 11:31 AM  Result Value Ref Range Status   Specimen Description URINE, RANDOM  Final   Special Requests NONE  Final   Culture (A)  Final    >=100,000 COLONIES/mL GRAM NEGATIVE RODS IDENTIFICATION AND SUSCEPTIBILITIES TO FOLLOW    Report Status PENDING  Incomplete  MRSA PCR Screening     Status: None   Collection Time: 05/22/15  1:19 PM  Result Value Ref Range Status   MRSA by PCR NEGATIVE NEGATIVE Final    Comment:        The GeneXpert MRSA Assay (FDA approved for NASAL specimens only), is one component of a comprehensive MRSA colonization surveillance program. It is not intended to diagnose MRSA infection nor to guide or monitor treatment for MRSA infections.    Anti-infectives    Start     Dose/Rate Route Frequency Ordered Stop   05/23/15 1000  azithromycin (ZITHROMAX) tablet 250 mg  Status:  Discontinued     250 mg Oral Daily 05/22/15  1431 05/23/15 0905   05/22/15 1630  cefTRIAXone (ROCEPHIN) 1 g in dextrose 5 % 50 mL IVPB     1 g 100 mL/hr over 30 Minutes Intravenous Every 24 hours 05/22/15 1553     05/22/15 1445  azithromycin (ZITHROMAX) tablet 500 mg     500 mg Oral Daily 05/22/15 1431 05/22/15 1606       CXR: No new CXR  IMPRESSION: 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 Possible PNA Hyponatremia Hypokalemia - resolved Protein-calorie malnutrition Adult FTT  PLAN/REC: Cont ICU/SDU until respiratory status stabilizes Cont supplemental O2 Cont nebulized steroids and bronchodilators Continue methylprednisolone Monitor BMET intermittently Monitor I/Os Correct electrolytes as indicated Monitor temp, WBC count Micro and abx as above   Candace Fischeravid Kamir Selover, MD PCCM service Mobile [phone] Pager [phone] 05/23/2015

## 2015-05-23 NOTE — Progress Notes (Addendum)
ELECTROLYTE CONSULT NOTE - INITIAL  Pharmacy Consult for Electrolyte management    Patient Measurements: Height: 5\' 4"  (162.6 cm) Weight: 180 lb 12.4 oz (82 kg) IBW/kg (Calculated) : 54.7  Vital Signs: Temp: 98.2 F (36.8 C) (04/10 0500) Temp Source: Oral (04/10 0500) BP: 123/61 mmHg (04/10 0600) Pulse Rate: 66 (04/10 0600)   Recent Labs  05/22/15 0821 05/23/15 0442  WBC 9.3 12.5*  HGB 17.0* 14.6  HCT 50.1* 43.3  PLT 189 175     Recent Labs  05/22/15 0821 05/22/15 1643 05/22/15 2058 05/23/15 0442  NA 114* 116* 116* 120*  K 2.3*  --  2.4* 3.9  CL <65*  --  70* 78*  CO2 37*  --  35* 33*  GLUCOSE 165*  --  136* 98  BUN 6  --  6 7  CREATININE 0.58  --  0.50 0.45  CALCIUM 9.0  --  8.1* 8.1*  MG 1.4*  --   --  2.4  PROT 8.0  --   --   --   ALBUMIN 4.3  --   --   --   AST 48*  --   --   --   ALT 18  --   --   --   ALKPHOS 165*  --   --   --   BILITOT 1.0  --   --   --    Estimated Creatinine Clearance: 62.9 mL/min (by C-G formula based on Cr of 0.45).    Recent Labs  05/22/15 1320  GLUCAP 174*   Assessment: 75 year old female admitted after fall secondary to weakness. Patient also has severe hyponatremia.     Pharmacy has been consulted for electrolyte management.   4/9 K: 2.3      Mg:  1.4   Plan:  Patient has orders for KCL 40mEq PO BID.  Will order KCL 10 mEq IV X4. Patient also have NS w/KCL going at 15700mL/hr.  Also will order magnesium 4gm IV x1.  Will recheck potassium at 2000 tonight and BMP and Mg with am labs.   4/9 PM K+ 2.4. Supplementation orders entered by CCM NP. Recheck BMP in AM.  4/10 AM K+ 3.9, Mg 2.4.No additional supplementation ordered. BMP in AM.  Pharmacy will continue monitor and replace per consult.   Cher NakaiSheema Hallaji, PharmD Pharmacy Resident  05/23/2015,6:29 AM

## 2015-05-24 ENCOUNTER — Inpatient Hospital Stay: Payer: Medicare (Managed Care)

## 2015-05-24 LAB — BASIC METABOLIC PANEL
Anion gap: 10 (ref 5–15)
BUN: 8 mg/dL (ref 6–20)
CALCIUM: 8.1 mg/dL — AB (ref 8.9–10.3)
CHLORIDE: 76 mmol/L — AB (ref 101–111)
CO2: 31 mmol/L (ref 22–32)
Creatinine, Ser: 0.47 mg/dL (ref 0.44–1.00)
GFR calc Af Amer: >60 mL/min (ref >60)
GLUCOSE: 156 mg/dL — AB (ref 65–99)
POTASSIUM: 4.1 mmol/L (ref 3.5–5.1)
Sodium: 117 mmol/L — CL (ref 135–145)

## 2015-05-24 LAB — CBC
HCT: 43.4 % (ref 35.0–47.0)
HEMOGLOBIN: 14.8 g/dL (ref 12.0–16.0)
MCH: 30 pg (ref 26.0–34.0)
MCHC: 34.1 g/dL (ref 32.0–36.0)
MCV: 88.1 fL (ref 80.0–100.0)
PLATELETS: 164 10*3/uL (ref 150–440)
RBC: 4.92 MIL/uL (ref 3.80–5.20)
RDW: 16 % — ABNORMAL HIGH (ref 11.5–14.5)
WBC: 9.3 10*3/uL (ref 3.6–11.0)

## 2015-05-24 LAB — URINE CULTURE: Culture: >=100000 — AB

## 2015-05-24 LAB — SODIUM: Sodium: 121 mmol/L — ABNORMAL LOW (ref 135–145)

## 2015-05-24 MED ORDER — METHYLPREDNISOLONE SODIUM SUCC 40 MG IJ SOLR
40.0000 mg | Freq: Two times a day (BID) | INTRAMUSCULAR | Status: DC
  Administered 2015-05-24 (×2): 40 mg via INTRAVENOUS
  Filled 2015-05-24 (×3): qty 1

## 2015-05-24 MED ORDER — SODIUM CHLORIDE 3 % IV SOLN: INTRAVENOUS | Status: DC

## 2015-05-24 MED ORDER — SODIUM CHLORIDE 3 % IV SOLN
INTRAVENOUS | Status: DC
  Administered 2015-05-24: 30 mL/h via INTRAVENOUS
  Filled 2015-05-24 (×3): qty 500

## 2015-05-24 NOTE — Progress Notes (Signed)
[facility] - [place] at [facility]                                                                                                                                                                                            Patient Demographics   Candace Woods, is a 75 y.o. female, DOB - 14-Jan-1941, WUJ:[id number]  Admit date - 05/22/2015   Admitting Physician Wyatt Haste, MD  Outpatient Primary MD for the patient is Bobbye Morton, MD   LOS - 2  Subjective:  Patient's breathing is improved currently on 6 L of oxygen. Has continued cough  Review of Systems:   CONSTITUTIONAL: No documented fever. No fatigue, weakness. No weight gain, no weight loss.  EYES: No blurry or double vision.  ENT: No tinnitus. No postnasal drip. No redness of the oropharynx.  RESPIRATORY: Positive dry cough, no wheeze, no hemoptysis. Positive dyspnea.  CARDIOVASCULAR: No chest pain. No orthopnea. No palpitations. No syncope.  GASTROINTESTINAL: No nausea, no vomiting or diarrhea. No abdominal pain. No melena or hematochezia.  GENITOURINARY: No dysuria or hematuria.  ENDOCRINE: No polyuria or nocturia. No heat or cold intolerance.  HEMATOLOGY: No anemia. No bruising. No bleeding.  INTEGUMENTARY: No rashes. No lesions.  MUSCULOSKELETAL: No arthritis. No swelling. No gout.  NEUROLOGIC: No numbness, tingling, or ataxia. No seizure-type activity.  PSYCHIATRIC: No anxiety. No insomnia. No ADD.    Vitals:   Filed Vitals:   05/24/15 1007 05/24/15 1100 05/24/15 1200 05/24/15 1221  BP: 159/87 143/88 181/90   Pulse: 78 68 66   Temp:      TempSrc:      Resp:  13 16   Height:      Weight:      SpO2:  100% 100% 96%    Wt Readings from Last 3 Encounters:  05/22/15 82 kg (180 lb 12.4 oz)     Intake/Output Summary (Last 24 hours) at 05/24/15 1249 Last data filed at 05/24/15 1000  Gross per 24 hour  Intake   1655 ml  Output   4250 ml  Net  -2595 ml    Physical Exam:   GENERAL:  Critically ill-appearing  HEAD, EYES, EARS, NOSE AND THROAT: Atraumatic, normocephalic. Extraocular muscles are intact. Pupils equal and reactive to light. Sclerae anicteric. No conjunctival injection. No oro-pharyngeal erythema.  NECK: Supple. There is no jugular venous distention. No bruits, no lymphadenopathy, no thyromegaly.  HEART: Regular rate and rhythm,. No murmurs, no rubs, no clicks.  LUNGS: Diminished breath sounds bilaterally with some associated muscle usage.  ABDOMEN: Soft, flat, nontender, nondistended. Has good bowel sounds. No hepatosplenomegaly  appreciated.  EXTREMITIES: No evidence of any cyanosis, clubbing, or peripheral edema.  +2 pedal and radial pulses bilaterally.  NEUROLOGIC: The patient is alert, awake, and oriented x3 with no focal motor or sensory deficits appreciated bilaterally. Has neck Torticollis SKIN: Moist and warm with no rashes appreciated.  Psych: Not anxious, depressed LN: No inguinal LN enlargement    Antibiotics   Anti-infectives    Start     Dose/Rate Route Frequency Ordered Stop   05/23/15 1800  cefTRIAXone (ROCEPHIN) 1 g in dextrose 5 % 50 mL IVPB     1 g 100 mL/hr over 30 Minutes Intravenous Every 24 hours 05/23/15 1457     05/23/15 1000  azithromycin (ZITHROMAX) tablet 250 mg  Status:  Discontinued     250 mg Oral Daily 05/22/15 1431 05/23/15 0905   05/22/15 1630  cefTRIAXone (ROCEPHIN) 1 g in dextrose 5 % 50 mL IVPB  Status:  Discontinued     1 g 100 mL/hr over 30 Minutes Intravenous Every 24 hours 05/22/15 1553 05/23/15 1457   05/22/15 1445  azithromycin (ZITHROMAX) tablet 500 mg     500 mg Oral Daily 05/22/15 1431 05/22/15 1606      Medications   Scheduled Meds: . atenolol  25 mg Oral Daily  . budesonide (PULMICORT) nebulizer solution  0.5 mg Nebulization BID  . cefTRIAXone (ROCEPHIN)  IV  1 g Intravenous Q24H  . enoxaparin (LOVENOX) injection  40 mg Subcutaneous Q24H  . famotidine  20 mg Oral Daily  . gabapentin  100 mg Oral  Daily  . ipratropium-albuterol  3 mL Nebulization Q6H  . methylPREDNISolone (SOLU-MEDROL) injection  40 mg Intravenous Q12H  . phenytoin  300 mg Oral QHS  . sennosides-docusate sodium  1 tablet Oral Daily  . sertraline  100 mg Oral Daily  . sodium chloride flush  3 mL Intravenous Q12H   Continuous Infusions: . sodium chloride (hypertonic)     PRN Meds:.acetaminophen **OR** acetaminophen, albuterol, ALPRAZolam, baclofen, hydrALAZINE, morphine injection, ondansetron **OR** ondansetron (ZOFRAN) IV, oxyCODONE   Data Review:   Micro Results Recent Results (from the past 240 hour(s))  Rapid Influenza A&B Antigens ([facility] only)     Status: None   Collection Time: 05/22/15  8:44 AM  Result Value Ref Range Status   Influenza A ([facility]) NEGATIVE NEGATIVE Final   Influenza B ([facility]) NEGATIVE NEGATIVE Final  Blood culture (routine x 2)     Status: None (Preliminary result)   Collection Time: 05/22/15  8:44 AM  Result Value Ref Range Status   Specimen Description BLOOD LEFT HAND  Final   Special Requests BOTTLES DRAWN AEROBIC AND ANAEROBIC  1CC  Final   Culture NO GROWTH < 24 HOURS  Final   Report Status PENDING  Incomplete  Blood culture (routine x 2)     Status: None (Preliminary result)   Collection Time: 05/22/15  8:44 AM  Result Value Ref Range Status   Specimen Description BLOOD LEFT WRIST  Final   Special Requests BOTTLES DRAWN AEROBIC AND ANAEROBIC  1CC  Final   Culture NO GROWTH < 24 HOURS  Final   Report Status PENDING  Incomplete  Urine culture     Status: Abnormal   Collection Time: 05/22/15 11:31 AM  Result Value Ref Range Status   Specimen Description URINE, RANDOM  Final   Special Requests NONE  Final   Culture >=100,000 COLONIES/mL ESCHERICHIA COLI (A)  Final   Report Status 05/24/2015 FINAL  Final   Organism ID, Bacteria  ESCHERICHIA COLI (A)  Final      Susceptibility   Escherichia coli - MIC*    AMPICILLIN >=32 RESISTANT Resistant     CEFAZOLIN 8 SENSITIVE Sensitive      CEFTRIAXONE <=1 SENSITIVE Sensitive     CIPROFLOXACIN >=4 RESISTANT Resistant     GENTAMICIN <=1 SENSITIVE Sensitive     IMIPENEM <=0.25 SENSITIVE Sensitive     NITROFURANTOIN <=16 SENSITIVE Sensitive     TRIMETH/SULFA <=20 SENSITIVE Sensitive     AMPICILLIN/SULBACTAM 4 SENSITIVE Sensitive     PIP/TAZO <=4 SENSITIVE Sensitive     Extended ESBL NEGATIVE Sensitive     * >=100,000 COLONIES/mL ESCHERICHIA COLI  MRSA PCR Screening     Status: None   Collection Time: 05/22/15  1:19 PM  Result Value Ref Range Status   MRSA by PCR NEGATIVE NEGATIVE Final    Comment:        The GeneXpert MRSA Assay (FDA approved for NASAL specimens only), is one component of a comprehensive MRSA colonization surveillance program. It is not intended to diagnose MRSA infection nor to guide or monitor treatment for MRSA infections.     Radiology Reports Ct Head Wo Contrast  05/22/2015  CLINICAL DATA:  Fall last evening.  Hypoxic and confusion.  COPD. EXAM: CT HEAD WITHOUT CONTRAST TECHNIQUE: Contiguous axial images were obtained from the base of the skull through the vertex without contrast. COMPARISON:  12/22/2007 FINDINGS: Subtle low-density in the subcortical white matter. There is focal white matter disease involving the left external capsule and the left white matter tracts. This could represent ischemic changes of unknown age. No evidence for acute hemorrhage, mass lesion, midline shift, hydrocephalus or large new infarct. No calvarial fracture. Visualized paranasal sinuses are clear. IMPRESSION: No acute intracranial abnormality. Age indeterminate white matter disease, particularly on the left side as described. Findings may represent old ischemic changes. Electronically Signed   By: Richarda Overlie M.D.   On: 05/22/2015 09:23   Ct Angio Chest Pe W/cm &/or Wo Cm  05/22/2015  CLINICAL DATA:  Recent fall with hypoxia and shortness of Breath EXAM: CT ANGIOGRAPHY CHEST WITH CONTRAST TECHNIQUE: Multidetector CT  imaging of the chest was performed using the standard protocol during bolus administration of intravenous contrast. Multiplanar CT image reconstructions and MIPs were obtained to evaluate the vascular anatomy. CONTRAST:  75 mL Isovue 370. COMPARISON:  None. FINDINGS: The lungs are well aerated bilaterally. Mild patchy infiltrate is noted in the right middle and right lower lobes. Some atelectatic changes are noted in the lingula. Diffuse emphysematous changes are seen with apical scarring on the right. Some chronic scarring in the right lower lobe is noted as well. The thoracic inlet is within normal limits. The thoracic aorta shows diffuse calcifications without aneurysmal dilatation or dissection. Atherosclerotic plaque is noted in the descending aorta. The pulmonary artery is well visualized and demonstrates a normal branching pattern. No filling defect is seen to suggest pulmonary embolism. Mild calcified hilar lymph nodes are seen consistent with prior granulomatous disease. No significant lymphadenopathy is seen. The visualized upper abdomen shows calcified splenic granulomas. No other focal abnormality is noted. The osseous structures are within normal limits. Review of the MIP images confirms the above findings. IMPRESSION: No evidence of pulmonary emboli. Diffuse emphysematous changes and changes of prior granulomatous disease. Patchy infiltrate in the right middle and right lower lobes. Electronically Signed   By: Alcide Clever M.D.   On: 05/22/2015 15:16   US Venous Img Lower Unilateral Left  05/22/2015  CLINICAL DATA:  Left lower extremity edema. EXAM: Or the hips LOWER EXTREMITY VENOUS DOPPLER ULTRASOUND TECHNIQUE: Gray-scale sonography with graded compression, as well as color Doppler and duplex ultrasound were performed to evaluate the lower extremity deep venous systems from the level of the common femoral vein and including the common femoral, femoral, profunda femoral, popliteal and calf veins  including the posterior tibial, peroneal and gastrocnemius veins when visible. The superficial great saphenous vein was also interrogated. Spectral Doppler was utilized to evaluate flow at rest and with distal augmentation maneuvers in the common femoral, femoral and popliteal veins. COMPARISON:  None. FINDINGS: Contralateral Common Femoral Vein: Respiratory phasicity is normal and symmetric with the symptomatic side. No evidence of thrombus. Normal compressibility. Common Femoral Vein: No evidence of thrombus. Normal compressibility, respiratory phasicity and response to augmentation. Saphenofemoral Junction: No evidence of thrombus. Normal compressibility and flow on color Doppler imaging. Profunda Femoral Vein: No evidence of thrombus. Normal compressibility and flow on color Doppler imaging. Femoral Vein: No evidence of thrombus. Normal compressibility, respiratory phasicity and response to augmentation. Popliteal Vein: No evidence of thrombus. Normal compressibility, respiratory phasicity and response to augmentation. Calf Veins: No evidence of thrombus. Normal compressibility and flow on color Doppler imaging. Superficial Great Saphenous Vein: No evidence of thrombus. Normal compressibility and flow on color Doppler imaging. Venous Reflux:  None. Other Findings:  None. IMPRESSION: No evidence of deep venous thrombosis. Electronically Signed   By: Amie Portlandavid  Ormond M.D.   On: 05/22/2015 15:37   Dg Chest Port 1 View  05/24/2015  CLINICAL DATA:  Respiratory failure, acute and chronic; history of COPD -asthma EXAM: PORTABLE CHEST 1 VIEW COMPARISON:  CT scan of the chest of May 22, 2015 FINDINGS: The lungs are well-expanded. The interstitial markings are coarse bilaterally. Increased interstitial density in the right infrahilar region is present which corresponds to parenchymal abnormalities in the right middle and lower lobes on the earlier chest CT scan. The cardiac silhouette is mildly enlarged. The pulmonary  vascularity is not engorged. There is calcification in the wall of the thoracic aorta. IMPRESSION: COPD.  Bibasilar atelectasis or pneumonia greatest on the right. Electronically Signed   By: David  SwazilandJordan M.D.   On: 05/24/2015 07:26   Dg Chest Portable 1 View  05/22/2015  CLINICAL DATA:  75 year old who had an unwitnessed fall at her home last night. Current smoker with current history of COPD. EXAM: PORTABLE CHEST 1 VIEW COMPARISON:  CT chest 08/11/2013. Chest x-rays 09/11/2012 and earlier. FINDINGS: Cardiac silhouette upper normal in size to slightly enlarged, unchanged. Emphysematous changes throughout both lungs with prominent bronchovascular markings diffusely and mild central peribronchial thickening, unchanged. Mild atelectasis in the lung bases. Lungs otherwise clear. No pneumothorax. No visible pleural effusions. Osseous demineralization. IMPRESSION: COPD/emphysema. Mild bibasilar atelectasis. No acute cardiopulmonary disease otherwise. Electronically Signed   By: Hulan Saashomas  Lawrence M.D.   On: 05/22/2015 09:34     CBC  Recent Labs Lab 05/22/15 0821 05/23/15 0442 05/24/15 0401  WBC 9.3 12.5* 9.3  HGB 17.0* 14.6 14.8  HCT 50.1* 43.3 43.4  PLT 189 175 164  MCV 86.6 88.5 88.1  MCH 29.4 29.9 30.0  MCHC 34.0 33.8 34.1  RDW 15.5* 15.7* 16.0*    Chemistries   Recent Labs Lab 05/22/15 0821 05/22/15 1643 05/22/15 2058 05/23/15 0442 05/24/15 0401  NA 114* 116* 116* 120* 117*  K 2.3*  --  2.4* 3.9 4.1  CL <65*  --  70* 78* 76*  CO2 37*  --  35* 33* 31  GLUCOSE 165*  --  136* 98 156*  BUN 6  --  CREATININE 0.58  --  0.50 0.45 0.47  CALCIUM 9.0  --  8.1* 8.1* 8.1*  MG 1.4*  --   --  2.4  --   AST 48*  --   --   --   --   ALT 18  --   --   --   --   ALKPHOS 165*  --   --   --   --   BILITOT 1.0  --   --   --   --    ------------------------------------------------------------------------------------------------------------------ estimated creatinine clearance is 62.9  mL/min (by C-G formula based on Cr of 0.47). ------------------------------------------------------------------------------------------------------------------ No results for input(s): HGBA1C in the last 72 hours. ------------------------------------------------------------------------------------------------------------------ No results for input(s): CHOL, HDL, LDLCALC, TRIG, CHOLHDL, LDLDIRECT in the last 72 hours. ------------------------------------------------------------------------------------------------------------------  Recent Labs  05/22/15 2058  TSH 1.005   ------------------------------------------------------------------------------------------------------------------ No results for input(s): VITAMINB12, FOLATE, FERRITIN, TIBC, IRON, RETICCTPCT in the last 72 hours.  Coagulation profile No results for input(s): INR, PROTIME in the last 168 hours.  No results for input(s): DDIMER in the last 72 hours.  Cardiac Enzymes  Recent Labs Lab 05/22/15 0821  TROPONINI <0.03   ------------------------------------------------------------------------------------------------------------------ Invalid input(s): POCBNP    Assessment & Plan   75 year old Caucasian female history of COPD non-option requiring presenting with mechanical fall and hypoxia  1. Acute on chronic respiratory failure with hypoxia:  This is due to acute on chronic COPD exasperation as well as right sided pneumonia Some improvement Appreciate pulmonary input Continue IV antibiotics with ceftriaxone Continue IV Solu-Medrol Nebulizers Continue close monitoring in the stepdown  2. Lower extremity edema: Doppler reviewed negative for DVT   3. Hyponatremia: Severe  Appreciate nephrology input  SIDH Sodium worse today patient started on 3% normal saline   4. Hypokalemia being replaced, magnesium normal  5. Elevated CK without kidney injury: IV fluid hydration repeat CPK in the morning  6. Venous  thromboembolism prophylactic: Heparin subcutaneous  7. CODE STATUS full       Code Status Orders        Start     Ordered   05/22/15 1448  Do not attempt resuscitation (DNR)   Continuous    Question Answer Comment  In the event of cardiac or respiratory ARREST Do not call a "code blue"   In the event of cardiac or respiratory ARREST Do not perform Intubation, CPR, defibrillation or ACLS   In the event of cardiac or respiratory ARREST Use medication by any route, position, wound care, and other measures to relive pain and suffering. May use oxygen, suction and manual treatment of airway obstruction as needed for comfort.      05/22/15 1447    Code Status History    Date Active Date Inactive Code Status Order ID Comments User Context   05/22/2015 11:15 AM 05/22/2015  2:47 PM Full Code [id number]  Wyatt Haste, MD ED           Consults  none   DVT Prophylaxis  Lovenox   Lab Results  Component Value Date   PLT 164 05/24/2015     Time Spent in minutes  critical care time  Greater than 50% of time spent in care coordination and counseling patient regarding the condition and plan of care.   Auburn Bilberry M.D on 05/24/2015 at 12:49 PM  Between 7am to 6pm -  Pager - [phone]  After 6pm go to [email or web address] - password [email or web address] [facility]   Office  [phone]

## 2015-05-24 NOTE — Progress Notes (Signed)
No overt distress on [place] O2. No new complaints.   Filed Vitals:   05/24/15 1200 05/24/15 1221 05/24/15 1300 05/24/15 1450  BP: 181/90  181/93 184/92  Pulse: 66  65   Temp:      TempSrc:      Resp: 16  15   Height:      Weight:      SpO2: 100% 96% 100%    HFNC 50%  Very frail HEENT WNL No JVD Diffuse 2 wheezes Reg, no M NABS, soft No edema  BMP Latest Ref Rng 05/24/2015 05/23/2015 05/22/2015  Glucose 65 - 99 mg/dL 295(A) 98 213(Y)  BUN 6 - 20 mg/dL Creatinine 0.44 - 1.00 mg/dL 8.65 7.84 6.96  Sodium 135 - 145 mmol/L 117(LL) 120(L) 116(LL)  Potassium 3.5 - 5.1 mmol/L 4.1 3.9 2.4(LL)  Chloride 101 - 111 mmol/L 76(L) 78(L) 70(L)  CO2 22 - 32 mmol/L 31 33(H) 35(H)  Calcium 8.9 - 10.3 mg/dL 8.1(L) 8.1(L) 8.1(L)    CBC Latest Ref Rng 05/24/2015 05/23/2015 05/22/2015  WBC 3.6 - 11.0 K/uL 9.3 12.5(H) 9.3  Hemoglobin 12.0 - 16.0 g/dL 29.5 28.4 17.0(H)  Hematocrit 35.0 - 47.0 % 43.4 43.3 50.1(H)  Platelets 150 - 440 K/uL 164 175 189   Results for orders placed or performed during the hospital encounter of 05/22/15  Rapid Influenza A&B Antigens ([facility] only)     Status: None   Collection Time: 05/22/15  8:44 AM  Result Value Ref Range Status   Influenza A ([facility]) NEGATIVE NEGATIVE Final   Influenza B ([facility]) NEGATIVE NEGATIVE Final  Blood culture (routine x 2)     Status: None (Preliminary result)   Collection Time: 05/22/15  8:44 AM  Result Value Ref Range Status   Specimen Description BLOOD LEFT HAND  Final   Special Requests BOTTLES DRAWN AEROBIC AND ANAEROBIC  1CC  Final   Culture NO GROWTH < 24 HOURS  Final   Report Status PENDING  Incomplete  Blood culture (routine x 2)     Status: None (Preliminary result)   Collection Time: 05/22/15  8:44 AM  Result Value Ref Range Status   Specimen Description BLOOD LEFT WRIST  Final   Special Requests BOTTLES DRAWN AEROBIC AND ANAEROBIC  1CC  Final   Culture NO GROWTH < 24 HOURS  Final   Report Status PENDING  Incomplete  Urine  culture     Status: Abnormal   Collection Time: 05/22/15 11:31 AM  Result Value Ref Range Status   Specimen Description URINE, RANDOM  Final   Special Requests NONE  Final   Culture >=100,000 COLONIES/mL ESCHERICHIA COLI (A)  Final   Report Status 05/24/2015 FINAL  Final   Organism ID, Bacteria ESCHERICHIA COLI (A)  Final      Susceptibility   Escherichia coli - MIC*    AMPICILLIN >=32 RESISTANT Resistant     CEFAZOLIN 8 SENSITIVE Sensitive     CEFTRIAXONE <=1 SENSITIVE Sensitive     CIPROFLOXACIN >=4 RESISTANT Resistant     GENTAMICIN <=1 SENSITIVE Sensitive     IMIPENEM <=0.25 SENSITIVE Sensitive     NITROFURANTOIN <=16 SENSITIVE Sensitive     TRIMETH/SULFA <=20 SENSITIVE Sensitive     AMPICILLIN/SULBACTAM 4 SENSITIVE Sensitive     PIP/TAZO <=4 SENSITIVE Sensitive     Extended ESBL NEGATIVE Sensitive     * >=100,000 COLONIES/mL ESCHERICHIA COLI  MRSA PCR Screening     Status: None   Collection Time: 05/22/15  1:19  PM  Result Value Ref Range Status   MRSA by PCR NEGATIVE NEGATIVE Final    Comment:        The GeneXpert MRSA Assay (FDA approved for NASAL specimens only), is one component of a comprehensive MRSA colonization surveillance program. It is not intended to diagnose MRSA infection nor to guide or monitor treatment for MRSA infections.    Anti-infectives    Start     Dose/Rate Route Frequency Ordered Stop   05/23/15 1800  cefTRIAXone (ROCEPHIN) 1 g in dextrose 5 % 50 mL IVPB     1 g 100 mL/hr over 30 Minutes Intravenous Every 24 hours 05/23/15 1457     05/23/15 1000  azithromycin (ZITHROMAX) tablet 250 mg  Status:  Discontinued     250 mg Oral Daily 05/22/15 1431 05/23/15 0905   05/22/15 1630  cefTRIAXone (ROCEPHIN) 1 g in dextrose 5 % 50 mL IVPB  Status:  Discontinued     1 g 100 mL/hr over 30 Minutes Intravenous Every 24 hours 05/22/15 1553 05/23/15 1457   05/22/15 1445  azithromycin (ZITHROMAX) tablet 500 mg     500 mg Oral Daily 05/22/15 1431 05/22/15 1606       CXR: RLL infiltrate  IMPRESSION: 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 Possible PNA Hyponatremia - Renal has ordered 3% saline Hypokalemia - resolved Protein-calorie malnutrition Adult FTT  PLAN/REC: Cont SDU through today Cont supplemental O2 Cont nebulized steroids and bronchodilators Continue methylprednisolone - dose reduced 04/11 Monitor BMET intermittently Monitor I/Os Correct electrolytes as indicated Midline catheter placed 04/11 for hypertonic saline Monitor temp, WBC count Micro and abx as above   Billy Fischeravid Simonds, MD PCCM service Mobile [phone] Pager [phone] 05/24/2015

## 2015-05-24 NOTE — Progress Notes (Signed)
[facility]  ROUNDING NOTE   Subjective:  Serum sodium down to 117 today. 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 Good urine output noted.    Objective:  Vital signs in last 24 hours:  Temp:  [97.6 F (36.4 C)-98.4 F (36.9 C)] 97.6 F (36.4 C) (04/11 0700) Pulse Rate:  [65-87] 70 (04/11 0700) Resp:  [12-18] 12 (04/11 0700) BP: (104-165)/(53-116) 165/74 mmHg (04/11 0700) SpO2:  [93 %-100 %] 99 % (04/11 0747) FiO2 (%):  [38 %-54 %] 40 % (04/11 0747)  Weight change:  Filed Weights   05/22/15 0810 05/22/15 1321  Weight: 75.297 kg (166 lb) 82 kg (180 lb 12.4 oz)    Intake/Output: I/O last 3 completed shifts: In: 2808 [P.O.:840; I.V.:1818; IV Piggyback:150] Out: 5000 [Urine:5000]   Intake/Output this shift:  Total I/O In: -  Out: 250 [Urine:250]  Physical Exam: General: No aucte distress  Head: Normocephalic, atraumatic. Moist oral mucosal membranes  Eyes: Anicteric  Neck: Supple, trachea midline  Lungs:  Rales on the right  Heart: S1S2 no rubs  Abdomen:  Soft, nontender, BS present   Extremities: no peripheral edema.  Neurologic: Nonfocal, moving all four extremities  Skin: No lesions       Basic Metabolic Panel:  Recent Labs Lab 05/22/15 0821 05/22/15 1643 05/22/15 2058 05/23/15 0442 05/24/15 0401  NA 114* 116* 116* 120* 117*  K 2.3*  --  2.4* 3.9 4.1  CL <65*  --  70* 78* 76*  CO2 37*  --  35* 33* 31  GLUCOSE 165*  --  136* 98 156*  BUN 6  --  CREATININE 0.58  --  0.50 0.45 0.47  CALCIUM 9.0  --  8.1* 8.1* 8.1*  MG 1.4*  --   --  2.4  --     Liver Function Tests:  Recent Labs Lab 05/22/15 0821  AST 48*  ALT 18  ALKPHOS 165*  BILITOT 1.0  PROT 8.0  ALBUMIN 4.3   No results for input(s): LIPASE, AMYLASE in the last 168 hours. No results for input(s): AMMONIA in the last 168 hours.  CBC:  Recent Labs Lab 05/22/15 0821 05/23/15 0442 05/24/15 0401  WBC 9.3 12.5* 9.3  HGB 17.0* 14.6 14.8  HCT 50.1* 43.3 43.4  MCV 86.6  88.5 88.1  PLT 189 175 164    Cardiac Enzymes:  Recent Labs Lab 05/22/15 0821  CKTOTAL 772*  TROPONINI <0.03    BNP: Invalid input(s): POCBNP  CBG:  Recent Labs Lab 05/22/15 1320  GLUCAP 174*    Microbiology: Results for orders placed or performed during the hospital encounter of 05/22/15  Rapid Influenza A&B Antigens ([facility] only)     Status: None   Collection Time: 05/22/15  8:44 AM  Result Value Ref Range Status   Influenza A ([facility]) NEGATIVE NEGATIVE Final   Influenza B ([facility]) NEGATIVE NEGATIVE Final  Blood culture (routine x 2)     Status: None (Preliminary result)   Collection Time: 05/22/15  8:44 AM  Result Value Ref Range Status   Specimen Description BLOOD LEFT HAND  Final   Special Requests BOTTLES DRAWN AEROBIC AND ANAEROBIC  1CC  Final   Culture NO GROWTH < 24 HOURS  Final   Report Status PENDING  Incomplete  Blood culture (routine x 2)     Status: None (Preliminary result)   Collection Time: 05/22/15  8:44 AM  Result Value Ref Range Status   Specimen Description BLOOD LEFT WRIST  Final  Special Requests BOTTLES DRAWN AEROBIC AND ANAEROBIC  1CC  Final   Culture NO GROWTH < 24 HOURS  Final   Report Status PENDING  Incomplete  Urine culture     Status: Abnormal   Collection Time: 05/22/15 11:31 AM  Result Value Ref Range Status   Specimen Description URINE, RANDOM  Final   Special Requests NONE  Final   Culture >=100,000 COLONIES/mL ESCHERICHIA COLI (A)  Final   Report Status 05/24/2015 FINAL  Final   Organism ID, Bacteria ESCHERICHIA COLI (A)  Final      Susceptibility   Escherichia coli - MIC*    AMPICILLIN >=32 RESISTANT Resistant     CEFAZOLIN 8 SENSITIVE Sensitive     CEFTRIAXONE <=1 SENSITIVE Sensitive     CIPROFLOXACIN >=4 RESISTANT Resistant     GENTAMICIN <=1 SENSITIVE Sensitive     IMIPENEM <=0.25 SENSITIVE Sensitive     NITROFURANTOIN <=16 SENSITIVE Sensitive     TRIMETH/SULFA <=20 SENSITIVE Sensitive     AMPICILLIN/SULBACTAM 4  SENSITIVE Sensitive     PIP/TAZO <=4 SENSITIVE Sensitive     Extended ESBL NEGATIVE Sensitive     * >=100,000 COLONIES/mL ESCHERICHIA COLI  MRSA PCR Screening     Status: None   Collection Time: 05/22/15  1:19 PM  Result Value Ref Range Status   MRSA by PCR NEGATIVE NEGATIVE Final    Comment:        The GeneXpert MRSA Assay (FDA approved for NASAL specimens only), is one component of a comprehensive MRSA colonization surveillance program. It is not intended to diagnose MRSA infection nor to guide or monitor treatment for MRSA infections.     Coagulation Studies: No results for input(s): LABPROT, INR in the last 72 hours.  Urinalysis:  Recent Labs  05/22/15 1131  COLORURINE YELLOW*  LABSPEC 1.009  PHURINE 6.0  GLUCOSEU NEGATIVE  HGBUR 1+*  BILIRUBINUR NEGATIVE  KETONESUR NEGATIVE  PROTEINUR 30*  NITRITE NEGATIVE  LEUKOCYTESUR 1+*      Imaging: Ct Head Wo Contrast  05/22/2015  CLINICAL DATA:  Fall last evening.  Hypoxic and confusion.  COPD. EXAM: CT HEAD WITHOUT CONTRAST TECHNIQUE: Contiguous axial images were obtained from the base of the skull through the vertex without contrast. COMPARISON:  12/22/2007 FINDINGS: Subtle low-density in the subcortical white matter. There is focal white matter disease involving the left external capsule and the left white matter tracts. This could represent ischemic changes of unknown age. No evidence for acute hemorrhage, mass lesion, midline shift, hydrocephalus or large new infarct. No calvarial fracture. Visualized paranasal sinuses are clear. IMPRESSION: No acute intracranial abnormality. Age indeterminate white matter disease, particularly on the left side as described. Findings may represent old ischemic changes. Electronically Signed   By: Richarda Overlie M.D.   On: 05/22/2015 09:23   Ct Angio Chest Pe W/cm &/or Wo Cm  05/22/2015  CLINICAL DATA:  Recent fall with hypoxia and shortness of Breath EXAM: CT ANGIOGRAPHY CHEST WITH CONTRAST  TECHNIQUE: Multidetector CT imaging of the chest was performed using the standard protocol during bolus administration of intravenous contrast. Multiplanar CT image reconstructions and MIPs were obtained to evaluate the vascular anatomy. CONTRAST:  75 mL Isovue 370. COMPARISON:  None. FINDINGS: The lungs are well aerated bilaterally. Mild patchy infiltrate is noted in the right middle and right lower lobes. Some atelectatic changes are noted in the lingula. Diffuse emphysematous changes are seen with apical scarring on the right. Some chronic scarring in the right lower lobe is noted  as well. The thoracic inlet is within normal limits. The thoracic aorta shows diffuse calcifications without aneurysmal dilatation or dissection. Atherosclerotic plaque is noted in the descending aorta. The pulmonary artery is well visualized and demonstrates a normal branching pattern. No filling defect is seen to suggest pulmonary embolism. Mild calcified hilar lymph nodes are seen consistent with prior granulomatous disease. No significant lymphadenopathy is seen. The visualized upper abdomen shows calcified splenic granulomas. No other focal abnormality is noted. The osseous structures are within normal limits. Review of the MIP images confirms the above findings. IMPRESSION: No evidence of pulmonary emboli. Diffuse emphysematous changes and changes of prior granulomatous disease. Patchy infiltrate in the right middle and right lower lobes. Electronically Signed   By: Alcide CleverMark  Lukens M.D.   On: 05/22/2015 15:16   [place] Venous Img Lower Unilateral Left  05/22/2015  CLINICAL DATA:  Left lower extremity edema. EXAM: Or the hips LOWER EXTREMITY VENOUS DOPPLER ULTRASOUND TECHNIQUE: Gray-scale sonography with graded compression, as well as color Doppler and duplex ultrasound were performed to evaluate the lower extremity deep venous systems from the level of the common femoral vein and including the common femoral, femoral, profunda femoral,  popliteal and calf veins including the posterior tibial, peroneal and gastrocnemius veins when visible. The superficial great saphenous vein was also interrogated. Spectral Doppler was utilized to evaluate flow at rest and with distal augmentation maneuvers in the common femoral, femoral and popliteal veins. COMPARISON:  None. FINDINGS: Contralateral Common Femoral Vein: Respiratory phasicity is normal and symmetric with the symptomatic side. No evidence of thrombus. Normal compressibility. Common Femoral Vein: No evidence of thrombus. Normal compressibility, respiratory phasicity and response to augmentation. Saphenofemoral Junction: No evidence of thrombus. Normal compressibility and flow on color Doppler imaging. Profunda Femoral Vein: No evidence of thrombus. Normal compressibility and flow on color Doppler imaging. Femoral Vein: No evidence of thrombus. Normal compressibility, respiratory phasicity and response to augmentation. Popliteal Vein: No evidence of thrombus. Normal compressibility, respiratory phasicity and response to augmentation. Calf Veins: No evidence of thrombus. Normal compressibility and flow on color Doppler imaging. Superficial Great Saphenous Vein: No evidence of thrombus. Normal compressibility and flow on color Doppler imaging. Venous Reflux:  None. Other Findings:  None. IMPRESSION: No evidence of deep venous thrombosis. Electronically Signed   By: Amie Portlandavid  Ormond M.D.   On: 05/22/2015 15:37   Dg Chest Port 1 View  05/24/2015  CLINICAL DATA:  Respiratory failure, acute and chronic; history of COPD -asthma EXAM: PORTABLE CHEST 1 VIEW COMPARISON:  CT scan of the chest of May 22, 2015 FINDINGS: The lungs are well-expanded. The interstitial markings are coarse bilaterally. Increased interstitial density in the right infrahilar region is present which corresponds to parenchymal abnormalities in the right middle and lower lobes on the earlier chest CT scan. The cardiac silhouette is mildly  enlarged. The pulmonary vascularity is not engorged. There is calcification in the wall of the thoracic aorta. IMPRESSION: COPD.  Bibasilar atelectasis or pneumonia greatest on the right. Electronically Signed   By: David  Swazil[place] M.D.   On: 05/24/2015 07:26   Dg Chest Portable 1 View  05/22/2015  CLINICAL DATA:  75 year old who had an unwitnessed fall at her home last night. Current smoker with current history of COPD. EXAM: PORTABLE CHEST 1 VIEW COMPARISON:  CT chest 08/11/2013. Chest x-rays 09/11/2012 and earlier. FINDINGS: Cardiac silhouette upper normal in size to slightly enlarged, unchanged. Emphysematous changes throughout both lungs with prominent bronchovascular markings diffusely and mild central peribronchial thickening, unchanged.  Mild atelectasis in the lung bases. Lungs otherwise clear. No pneumothorax. No visible pleural effusions. Osseous demineralization. IMPRESSION: COPD/emphysema. Mild bibasilar atelectasis. No acute cardiopulmonary disease otherwise. Electronically Signed   By: Hulan Saas M.D.   On: 05/22/2015 09:34     Medications:   . 0.9 % NaCl with KCl 40 mEq / L 75 mL/hr (05/23/15 1420)   . atenolol  25 mg Oral Daily  . budesonide (PULMICORT) nebulizer solution  0.5 mg Nebulization BID  . cefTRIAXone (ROCEPHIN)  IV  1 g Intravenous Q24H  . enoxaparin (LOVENOX) injection  40 mg Subcutaneous Q24H  . famotidine  20 mg Oral Daily  . gabapentin  100 mg Oral Daily  . ipratropium-albuterol  3 mL Nebulization Q6H  . methylPREDNISolone (SOLU-MEDROL) injection  60 mg Intravenous Q12H  . phenytoin  300 mg Oral QHS  . sennosides-docusate sodium  1 tablet Oral Daily  . sertraline  100 mg Oral Daily  . sodium chloride flush  3 mL Intravenous Q12H   acetaminophen **OR** acetaminophen, albuterol, ALPRAZolam, baclofen, hydrALAZINE, morphine injection, ondansetron **OR** ondansetron (ZOFRAN) IV, oxyCODONE  Assessment/ Plan:  75 y.o. female with past medical history of COPD,  hypertension, seizure disorder, peripheral neuropathy, anxiety, depression, history of psychogenic polydipsia who presented to [facility] with mechanical fall. Patient known to [place] from prior admissions as we saw her for hyponatremia in 2014.   1.  Hyponatremia, with prior hx of psychogenic polydipsia.  2.  Acute respiratory failure. 3.  Hypokalemia. 4.  Hypomagnesemia.   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 Therefore we will discontinue normal saline at this time. We will proceed with 3% saline at a rate of 30 cc per hour. We recommend a target sodium of 125 within the next 24 hours.  She will require either a PICC line or central line. This was discussed with Dr. Sung Amabile.  Monitor serum sodium every 4 hours for now. Respiratory status appears to be improving. We will continue to monitor progress closely.   LOS: 2 Jennife Zaucha 4/11/20179:04 AM

## 2015-05-24 NOTE — Progress Notes (Signed)
Initial Nutrition Assessment  INTERVENTION:   Meals and Snacks: pt does not like the hospital food, may benefit from family bringing in outside food. On Diabetic diet, no hx of DM, may benefit from removal of this dietary restriction.  Cater to patient preferences  NUTRITION DIAGNOSIS:   Inadequate oral intake related to poor appetite as evidenced by meal completion < 50%.  GOAL:   Patient will meet greater than or equal to 90% of their needs  MONITOR:   PO intake, Labs, Weight trends  REASON FOR ASSESSMENT:   Consult Poor PO  ASSESSMENT:    Pt admitted with acute on chronic respiratory failure with COPD exacerbation, LE edema, severe hyponatremia, starting on 3% NS today  Past Medical History  Diagnosis Date  . COPD (chronic obstructive pulmonary disease) (HCC)   . Hypertension   . Seizures (HCC)   . Peripheral neuropathy (HCC)   . Torticollis Right  . Anxiety   . Depression   . Psychogenic polydipsia   . MRSA pneumonia (HCC)     2014  . Hyperlipemia   . Asthma     Diet Order:  Diet Carb Modified Fluid consistency:: Thin; Room service appropriate?: Yes   Energy Intake: recorded po intake 45% of meals (limited documentation); pt is not happy with food in hospital  Skin:  Reviewed, no issues  Last BM:  no BM documented  Labs: sodium 117  Meds: solumedrol, 3% NS at 30 ml/hr  Height:   Ht Readings from Last 1 Encounters:  05/22/15 5\' 4"  (1.626 m)    Weight:   Wt Readings from Last 1 Encounters:  05/22/15 180 lb 12.4 oz (82 kg)    Filed Weights   05/22/15 0810 05/22/15 1321  Weight: 166 lb (75.297 kg) 180 lb 12.4 oz (82 kg)    BMI:  Body mass index is 31.02 kg/(m^2).  Estimated Nutritional Needs:   Kcal:  1700-2050 kcals   Protein:  65-76 g   Fluid:  >1.5 L  EDUCATION NEEDS:   No education needs identified at this time  Romelle StarcherCate Nathaneil Feagans MS, RD, LDN [phone] Pager  [phone] Weekend/On-Call Pager

## 2015-05-25 DIAGNOSIS — R410 Disorientation, unspecified: Secondary | ICD-10-CM

## 2015-05-25 LAB — BASIC METABOLIC PANEL
ANION GAP: 11 (ref 5–15)
BUN: 10 mg/dL (ref 6–20)
CO2: 30 mmol/L (ref 22–32)
Calcium: 8.5 mg/dL — ABNORMAL LOW (ref 8.9–10.3)
Chloride: 81 mmol/L — ABNORMAL LOW (ref 101–111)
Creatinine, Ser: 0.53 mg/dL (ref 0.44–1.00)
Glucose, Bld: 130 mg/dL — ABNORMAL HIGH (ref 65–99)
POTASSIUM: 4.1 mmol/L (ref 3.5–5.1)
SODIUM: 122 mmol/L — AB (ref 135–145)

## 2015-05-25 LAB — SODIUM
Sodium: 122 mmol/L — ABNORMAL LOW (ref 135–145)
Sodium: 125 mmol/L — ABNORMAL LOW (ref 135–145)
Sodium: 126 mmol/L — ABNORMAL LOW (ref 135–145)

## 2015-05-25 MED ORDER — DOXYCYCLINE HYCLATE 100 MG PO TABS: 100.0000 mg | ORAL_TABLET | Freq: Two times a day (BID) | ORAL | Status: DC

## 2015-05-25 MED ORDER — SODIUM CHLORIDE 3 % IV SOLN
INTRAVENOUS | Status: DC
  Administered 2015-05-25: 20 mL/h via INTRAVENOUS
  Filled 2015-05-25 (×3): qty 500

## 2015-05-25 MED ORDER — BUDESONIDE 0.25 MG/2ML IN SUSP
0.2500 mg | Freq: Two times a day (BID) | RESPIRATORY_TRACT | Status: DC
  Administered 2015-05-26 – 2015-06-07 (×23): 0.25 mg via RESPIRATORY_TRACT
  Filled 2015-05-25 (×26): qty 2

## 2015-05-25 MED ORDER — HALOPERIDOL LACTATE 5 MG/ML IJ SOLN
INTRAMUSCULAR | Status: AC
  Filled 2015-05-25: qty 1

## 2015-05-25 MED ORDER — SERTRALINE HCL 50 MG PO TABS: 50.0000 mg | ORAL_TABLET | Freq: Every day | ORAL | Status: DC

## 2015-05-25 MED ORDER — CETYLPYRIDINIUM CHLORIDE 0.05 % MT LIQD
7.0000 mL | Freq: Two times a day (BID) | OROMUCOSAL | Status: DC
  Administered 2015-05-25 – 2015-06-07 (×25): 7 mL via OROMUCOSAL

## 2015-05-25 MED ORDER — ALPRAZOLAM 0.25 MG PO TABS: 0.2500 mg | ORAL_TABLET | Freq: Three times a day (TID) | ORAL | Status: DC | PRN

## 2015-05-25 MED ORDER — HALOPERIDOL LACTATE 5 MG/ML IJ SOLN
2.0000 mg | INTRAMUSCULAR | Status: DC | PRN
  Administered 2015-05-25: 2 mg via INTRAVENOUS

## 2015-05-25 MED ORDER — HALOPERIDOL LACTATE 5 MG/ML IJ SOLN
1.0000 mg | Freq: Four times a day (QID) | INTRAMUSCULAR | Status: DC | PRN
  Administered 2015-05-25: 1 mg via INTRAVENOUS
  Filled 2015-05-25: qty 1

## 2015-05-25 MED ORDER — ATENOLOL 50 MG PO TABS: 50.0000 mg | ORAL_TABLET | Freq: Every day | ORAL | Status: DC

## 2015-05-25 NOTE — Progress Notes (Signed)
MEDICATION RELATED CONSULT NOTE    Pharmacy Consult for Sodium Monitoring  Indication: hyponatremia.   Allergies  Allergen Reactions  . Aspirin   . Codeine   . Contrast Media [Iodinated Diagnostic Agents] Hives    Hives with contrast media injections  . Penicillins   . Sulfa Antibiotics     Patient Measurements: Height: 5\' 4"  (162.6 cm) Weight: 180 lb 12.4 oz (82 kg) IBW/kg (Calculated) : 54.7   Vital Signs: Temp: 98.6 F (37 C) (04/12 1800) Temp Source: Axillary (04/12 1800) BP: 142/76 mmHg (04/12 2000) Pulse Rate: 130 (04/12 2000) Intake/Output from previous day: 04/11 0701 - 04/12 0700 In: 735 [P.O.:480; I.V.:205; IV Piggyback:50] Out: 3900 [Urine:3900] Intake/Output from this shift:    Labs:  Recent Labs  05/23/15 0442 05/24/15 0401 05/25/15 0421  WBC 12.5* 9.3  --   HGB 14.6 14.8  --   HCT 43.3 43.4  --   PLT 175 164  --   CREATININE 0.45 0.47 0.53  MG 2.4  --   --    Estimated Creatinine Clearance: 62.9 mL/min (by C-G formula based on Cr of 0.53).   Infusions:  . sodium chloride (hypertonic) 20 mL/hr (05/25/15 1009)    Assessment: Pharmacy monitoring sodium in 75 yo female with hyponatremia. Patient currently receiving sodium chloride 3% at 4720mL/hr.   Goal of Therapy:  Target sodium of 130, 24 hours after initiation of therapy.   Plan:  Sodium monitoring is scheduled q4hr. Will monitor levels for increase in sodium. Will contact e-link with issues overnight.   Pharmacy will continue to monitor.   Simpson,Michael L 05/25/2015,9:23 PM

## 2015-05-25 NOTE — Progress Notes (Signed)
[facility] [place] at [facility]                                                                                                                                                                                            Patient Demographics   Candace Woods, is a 75 y.o. female, DOB - 07-05-1940, XBM:[id number]  Admit date - 05/22/2015   Admitting Physician Wyatt Hasteavid K Hower, MD  Outpatient Primary MD for the patient is Candace MortonSHARON A REILLY, MD   LOS - 3  Subjective:  Patient is very confused and agitated and screaming  Review of Systems:   CONSTITUTIONAL: Confused and agitated  Vitals:   Filed Vitals:   05/25/15 0600 05/25/15 0700 05/25/15 0744 05/25/15 0800  BP: 124/67 135/73  164/86  Pulse: 97 94 105 110  Temp:   98.1 F (36.7 C)   TempSrc:   Axillary   Resp: 17 15 22 18   Height:      Weight:      SpO2: 92% 94% 93% 93%    Wt Readings from Last 3 Encounters:  05/22/15 82 kg (180 lb 12.4 oz)     Intake/Output Summary (Last 24 hours) at 05/25/15 1004 Last data filed at 05/25/15 0600  Gross per 24 hour  Intake    495 ml  Output   3650 ml  Net  -3155 ml    Physical Exam:   GENERAL: Agitated HEAD, EYES, EARS, NOSE AND THROAT: Atraumatic, normocephalic. Extraocular muscles are intact. Pupils equal and reactive to light. Sclerae anicteric. No conjunctival injection. No oro-pharyngeal erythema.  NECK: Supple. There is no jugular venous distention. No bruits, no lymphadenopathy, no thyromegaly.  HEART: Regular rate and rhythm,. No murmurs, no rubs, no clicks.  LUNGS: Diminished breath sounds bilaterally with some associated muscle usage.  ABDOMEN: Soft, flat, nontender, nondistended. Has good bowel sounds. No hepatosplenomegaly appreciated.  EXTREMITIES: No evidence of any cyanosis, clubbing, or peripheral edema.  +2 pedal and radial pulses bilaterally.  NEUROLOGIC:  Very agitated SKIN: Moist and warm with no rashes appreciated.  Psych: Agitated and  anxious screaming LN: No inguinal LN enlargement    Antibiotics   Anti-infectives    Start     Dose/Rate Route Frequency Ordered Stop   05/25/15 1000  doxycycline (VIBRA-TABS) tablet 100 mg     100 mg Oral Every 12 hours 05/25/15 0928 05/30/15 0959   05/23/15 1800  cefTRIAXone (ROCEPHIN) 1 g in dextrose 5 % 50 mL IVPB  Status:  Discontinued     1 g 100 mL/hr over 30 Minutes Intravenous Every 24 hours 05/23/15 1457 05/25/15 [id number]  05/23/15 1000  azithromycin (ZITHROMAX) tablet 250 mg  Status:  Discontinued     250 mg Oral Daily 05/22/15 1431 05/23/15 0905   05/22/15 1630  cefTRIAXone (ROCEPHIN) 1 g in dextrose 5 % 50 mL IVPB  Status:  Discontinued     1 g 100 mL/hr over 30 Minutes Intravenous Every 24 hours 05/22/15 1553 05/23/15 1457   05/22/15 1445  azithromycin (ZITHROMAX) tablet 500 mg     500 mg Oral Daily 05/22/15 1431 05/22/15 1606      Medications   Scheduled Meds: . atenolol  50 mg Oral Daily  . budesonide (PULMICORT) nebulizer solution  0.25 mg Nebulization BID  . doxycycline  100 mg Oral Q12H  . enoxaparin (LOVENOX) injection  40 mg Subcutaneous Q24H  . famotidine  20 mg Oral Daily  . ipratropium-albuterol  3 mL Nebulization Q6H  . phenytoin  300 mg Oral QHS  . sennosides-docusate sodium  1 tablet Oral Daily  . sertraline  50 mg Oral Daily  . sodium chloride flush  3 mL Intravenous Q12H   Continuous Infusions: . sodium chloride (hypertonic)     PRN Meds:.acetaminophen **OR** [DISCONTINUED] acetaminophen, albuterol, ALPRAZolam, baclofen, haloperidol lactate, hydrALAZINE, morphine injection, ondansetron **OR** ondansetron (ZOFRAN) IV, oxyCODONE   Data Review:   Micro Results Recent Results (from the past 240 hour(s))  Rapid Influenza A&B Antigens ([facility] only)     Status: None   Collection Time: 05/22/15  8:44 AM  Result Value Ref Range Status   Influenza A ([facility]) NEGATIVE NEGATIVE Final   Influenza B ([facility]) NEGATIVE NEGATIVE Final  Blood culture (routine x  2)     Status: None (Preliminary result)   Collection Time: 05/22/15  8:44 AM  Result Value Ref Range Status   Specimen Description BLOOD LEFT HAND  Final   Special Requests BOTTLES DRAWN AEROBIC AND ANAEROBIC  1CC  Final   Culture NO GROWTH < 24 HOURS  Final   Report Status PENDING  Incomplete  Blood culture (routine x 2)     Status: None (Preliminary result)   Collection Time: 05/22/15  8:44 AM  Result Value Ref Range Status   Specimen Description BLOOD LEFT WRIST  Final   Special Requests BOTTLES DRAWN AEROBIC AND ANAEROBIC  1CC  Final   Culture NO GROWTH < 24 HOURS  Final   Report Status PENDING  Incomplete  Urine culture     Status: Abnormal   Collection Time: 05/22/15 11:31 AM  Result Value Ref Range Status   Specimen Description URINE, RANDOM  Final   Special Requests NONE  Final   Culture >=100,000 COLONIES/mL ESCHERICHIA COLI (A)  Final   Report Status 05/24/2015 FINAL  Final   Organism ID, Bacteria ESCHERICHIA COLI (A)  Final      Susceptibility   Escherichia coli - MIC*    AMPICILLIN >=32 RESISTANT Resistant     CEFAZOLIN 8 SENSITIVE Sensitive     CEFTRIAXONE <=1 SENSITIVE Sensitive     CIPROFLOXACIN >=4 RESISTANT Resistant     GENTAMICIN <=1 SENSITIVE Sensitive     IMIPENEM <=0.25 SENSITIVE Sensitive     NITROFURANTOIN <=16 SENSITIVE Sensitive     TRIMETH/SULFA <=20 SENSITIVE Sensitive     AMPICILLIN/SULBACTAM 4 SENSITIVE Sensitive     PIP/TAZO <=4 SENSITIVE Sensitive     Extended ESBL NEGATIVE Sensitive     * >=100,000 COLONIES/mL ESCHERICHIA COLI  MRSA PCR Screening     Status: None   Collection Time: 05/22/15  1:19 PM  Result Value  Ref Range Status   MRSA by PCR NEGATIVE NEGATIVE Final    Comment:        The GeneXpert MRSA Assay (FDA approved for NASAL specimens only), is one component of a comprehensive MRSA colonization surveillance program. It is not intended to diagnose MRSA infection nor to guide or monitor treatment for MRSA infections.      Radiology Reports Ct Head Wo Contrast  05/22/2015  CLINICAL DATA:  Fall last evening.  Hypoxic and confusion.  COPD. EXAM: CT HEAD WITHOUT CONTRAST TECHNIQUE: Contiguous axial images were obtained from the base of the skull through the vertex without contrast. COMPARISON:  12/22/2007 FINDINGS: Subtle low-density in the subcortical white matter. There is focal white matter disease involving the left external capsule and the left white matter tracts. This could represent ischemic changes of unknown age. No evidence for acute hemorrhage, mass lesion, midline shift, hydrocephalus or large new infarct. No calvarial fracture. Visualized paranasal sinuses are clear. IMPRESSION: No acute intracranial abnormality. Age indeterminate white matter disease, particularly on the left side as described. Findings may represent old ischemic changes. Electronically Signed   By: Richarda Overlie M.D.   On: 05/22/2015 09:23   Ct Angio Chest Pe W/cm &/or Wo Cm  05/22/2015  CLINICAL DATA:  Recent fall with hypoxia and shortness of Breath EXAM: CT ANGIOGRAPHY CHEST WITH CONTRAST TECHNIQUE: Multidetector CT imaging of the chest was performed using the standard protocol during bolus administration of intravenous contrast. Multiplanar CT image reconstructions and MIPs were obtained to evaluate the vascular anatomy. CONTRAST:  75 mL Isovue 370. COMPARISON:  None. FINDINGS: The lungs are well aerated bilaterally. Mild patchy infiltrate is noted in the right middle and right lower lobes. Some atelectatic changes are noted in the lingula. Diffuse emphysematous changes are seen with apical scarring on the right. Some chronic scarring in the right lower lobe is noted as well. The thoracic inlet is within normal limits. The thoracic aorta shows diffuse calcifications without aneurysmal dilatation or dissection. Atherosclerotic plaque is noted in the descending aorta. The pulmonary artery is well visualized and demonstrates a normal branching  pattern. No filling defect is seen to suggest pulmonary embolism. Mild calcified hilar lymph nodes are seen consistent with prior granulomatous disease. No significant lymphadenopathy is seen. The visualized upper abdomen shows calcified splenic granulomas. No other focal abnormality is noted. The osseous structures are within normal limits. Review of the MIP images confirms the above findings. IMPRESSION: No evidence of pulmonary emboli. Diffuse emphysematous changes and changes of prior granulomatous disease. Patchy infiltrate in the right middle and right lower lobes. Electronically Signed   By: Alcide Clever M.D.   On: 05/22/2015 15:16   US Venous Img Lower Unilateral Left  05/22/2015  CLINICAL DATA:  Left lower extremity edema. EXAM: Or the hips LOWER EXTREMITY VENOUS DOPPLER ULTRASOUND TECHNIQUE: Gray-scale sonography with graded compression, as well as color Doppler and duplex ultrasound were performed to evaluate the lower extremity deep venous systems from the level of the common femoral vein and including the common femoral, femoral, profunda femoral, popliteal and calf veins including the posterior tibial, peroneal and gastrocnemius veins when visible. The superficial great saphenous vein was also interrogated. Spectral Doppler was utilized to evaluate flow at rest and with distal augmentation maneuvers in the common femoral, femoral and popliteal veins. COMPARISON:  None. FINDINGS: Contralateral Common Femoral Vein: Respiratory phasicity is normal and symmetric with the symptomatic side. No evidence of thrombus. Normal compressibility. Common Femoral Vein: No evidence of thrombus.  Normal compressibility, respiratory phasicity and response to augmentation. Saphenofemoral Junction: No evidence of thrombus. Normal compressibility and flow on color Doppler imaging. Profunda Femoral Vein: No evidence of thrombus. Normal compressibility and flow on color Doppler imaging. Femoral Vein: No evidence of thrombus.  Normal compressibility, respiratory phasicity and response to augmentation. Popliteal Vein: No evidence of thrombus. Normal compressibility, respiratory phasicity and response to augmentation. Calf Veins: No evidence of thrombus. Normal compressibility and flow on color Doppler imaging. Superficial Great Saphenous Vein: No evidence of thrombus. Normal compressibility and flow on color Doppler imaging. Venous Reflux:  None. Other Findings:  None. IMPRESSION: No evidence of deep venous thrombosis. Electronically Signed   By: Amie Portland M.D.   On: 05/22/2015 15:37   Dg Chest Port 1 View  05/24/2015  CLINICAL DATA:  PICC line placement follow-up study; history of acute on chronic respiratory failure EXAM: PORTABLE CHEST 1 VIEW COMPARISON:  Portable chest x-ray of today's date FINDINGS: The right-sided PICC line tip again extends into the venous system of the lower right neck. The tip is not included in the field of view. The lungs remain hyperinflated. The interstitial markings remain increased especially at the right lung base. There is a small right pleural effusion. The cardiac silhouette remains enlarged. The central pulmonary vascularity is mildly prominent. IMPRESSION: The PICC line tip again extends into the neck on the right and extends beyond the field of view of the study. Repositioning is indicated. These results will be called to the ordering clinician or representative by the Radiologist Assistant, and communication documented in the PACS or zVision Dashboard. Electronically Signed   By: David  [place] M.D.   On: 05/24/2015 14:29   Dg Chest Port 1 View  05/24/2015  CLINICAL DATA:  75 year old female with PICC line placement. Subsequent encounter. EXAM: PORTABLE CHEST 1 VIEW COMPARISON:  05/24/2015 5:41 a.m. FINDINGS: Right PICC line has been placed and traverses superiorly into the neck. It is possible this curls upon itself although full course of the PICC line is not imaged. This will need to be  reposition. Remainder of findings without change since exam earlier today. IMPRESSION: Right PICC line has been placed and traverses superiorly into the neck. It is possible this curls upon itself although full course of the PICC line is not imaged. This will need to be reposition. These results will be called to the ordering clinician or representative by the Radiologist Assistant, and communication documented in the PACS or zVision Dashboard. Electronically Signed   By: Lacy Duverney M.D.   On: 05/24/2015 13:25   Dg Chest Port 1 View  05/24/2015  CLINICAL DATA:  Respiratory failure, acute and chronic; history of COPD -asthma EXAM: PORTABLE CHEST 1 VIEW COMPARISON:  CT scan of the chest of May 22, 2015 FINDINGS: The lungs are well-expanded. The interstitial markings are coarse bilaterally. Increased interstitial density in the right infrahilar region is present which corresponds to parenchymal abnormalities in the right middle and lower lobes on the earlier chest CT scan. The cardiac silhouette is mildly enlarged. The pulmonary vascularity is not engorged. There is calcification in the wall of the thoracic aorta. IMPRESSION: COPD.  Bibasilar atelectasis or pneumonia greatest on the right. Electronically Signed   By: David  [place] M.D.   On: 05/24/2015 07:26   Dg Chest Portable 1 View  05/22/2015  CLINICAL DATA:  75 year old who had an unwitnessed fall at her home last night. Current smoker with current history of COPD. EXAM: PORTABLE CHEST 1 VIEW COMPARISON:  CT chest 08/11/2013. Chest x-rays 09/11/2012 and earlier. FINDINGS: Cardiac silhouette upper normal in size to slightly enlarged, unchanged. Emphysematous changes throughout both lungs with prominent bronchovascular markings diffusely and mild central peribronchial thickening, unchanged. Mild atelectasis in the lung bases. Lungs otherwise clear. No pneumothorax. No visible pleural effusions. Osseous demineralization. IMPRESSION: COPD/emphysema. Mild  bibasilar atelectasis. No acute cardiopulmonary disease otherwise. Electronically Signed   By: Hulan Saas M.D.   On: 05/22/2015 09:34     CBC  Recent Labs Lab 05/22/15 0821 05/23/15 0442 05/24/15 0401  WBC 9.3 12.5* 9.3  HGB 17.0* 14.6 14.8  HCT 50.1* 43.3 43.4  PLT 189 175 164  MCV 86.6 88.5 88.1  MCH 29.4 29.9 30.0  MCHC 34.0 33.8 34.1  RDW 15.5* 15.7* 16.0*    Chemistries   Recent Labs Lab 05/22/15 0821  05/22/15 2058 05/23/15 0442 05/24/15 0401 05/24/15 2138 05/25/15 0421  NA 114*  < > 116* 120* 117* 121* 122*  K 2.3*  --  2.4* 3.9 4.1  --  4.1  CL <65*  --  70* 78* 76*  --  81*  CO2 37*  --  35* 33* 31  --  30  GLUCOSE 165*  --  136* 98 156*  --  130*  BUN 6  --  6 7 8   --  10  CREATININE 0.58  --  0.50 0.45 0.47  --  0.53  CALCIUM 9.0  --  8.1* 8.1* 8.1*  --  8.5*  MG 1.4*  --   --  2.4  --   --   --   AST 48*  --   --   --   --   --   --   ALT 18  --   --   --   --   --   --   ALKPHOS 165*  --   --   --   --   --   --   BILITOT 1.0  --   --   --   --   --   --   < > = values in this interval not displayed. ------------------------------------------------------------------------------------------------------------------ estimated creatinine clearance is 62.9 mL/min (by C-G formula based on Cr of 0.53). ------------------------------------------------------------------------------------------------------------------ No results for input(s): HGBA1C in the last 72 hours. ------------------------------------------------------------------------------------------------------------------ No results for input(s): CHOL, HDL, LDLCALC, TRIG, CHOLHDL, LDLDIRECT in the last 72 hours. ------------------------------------------------------------------------------------------------------------------  Recent Labs  05/22/15 2058  TSH 1.005   ------------------------------------------------------------------------------------------------------------------ No results  for input(s): VITAMINB12, FOLATE, FERRITIN, TIBC, IRON, RETICCTPCT in the last 72 hours.  Coagulation profile No results for input(s): INR, PROTIME in the last 168 hours.  No results for input(s): DDIMER in the last 72 hours.  Cardiac Enzymes  Recent Labs Lab 05/22/15 0821  TROPONINI <0.03   ------------------------------------------------------------------------------------------------------------------ Invalid input(s): POCBNP    Assessment & Plan   75 year old Caucasian female history of COPD non-option requiring presenting with mechanical fall and hypoxia  1. Acute on chronic respiratory failure with hypoxia:  This is due to acute on chronic COPD exasperation as well as right sided pneumonia Oxygen requirement less  on 4 L oxygen Appreciate pulmonary input Continue IV antibiotics with ceftriaxone Due to severe agitation steroids may be playing a role they have been discontinued Nebulizers Continue close monitoring in the stepdown  2. Lower extremity edema: No evidence of DVT  3. Hyponatremia: Severe  Appreciate nephrology input  sodium improved with hypertonic solution  4. Hypokalemia being replaced, magnesium normal  5. Elevated  CK without kidney injury: IV fluid hydration repeat CPK in the morning  6. Venous thromboembolism prophylactic: Heparin subcutaneous  7. CODE STATUS dnr      Code Status Orders        Start     Ordered   05/22/15 1448  Do not attempt resuscitation (DNR)   Continuous    Question Answer Comment  In the event of cardiac or respiratory ARREST Do not call a "code blue"   In the event of cardiac or respiratory ARREST Do not perform Intubation, CPR, defibrillation or ACLS   In the event of cardiac or respiratory ARREST Use medication by any route, position, wound care, and other measures to relive pain and suffering. May use oxygen, suction and manual treatment of airway obstruction as needed for comfort.      05/22/15 1447    Code  Status History    Date Active Date Inactive Code Status Order ID Comments User Context   05/22/2015 11:15 AM 05/22/2015  2:47 PM Full Code [id number]  Wyatt Haste, MD ED           Consults  none   DVT Prophylaxis  Lovenox   Lab Results  Component Value Date   PLT 164 05/24/2015     Time Spent in minutes    Greater than 50% of time spent in care coordination and counseling patient regarding the condition and plan of care.   Auburn Bilberry M.D on 05/25/2015 at 10:04 AM  Between 7am to 6pm - Pager - [phone]  After 6pm go to [email or web address] - password [email or web address] [facility]  [facility]   Office  [phone]

## 2015-05-25 NOTE — Progress Notes (Signed)
[facility]  ROUNDING NOTE   Subjective:  Called by pharmacy last night. Sodium had risen to 121 rather rapidly as 3% saline was started late. Therefore instruction was given to hold 3% saline. We will likely resume this this a.m. Patient quite delirious today. She is yelling out, and asked nonbelievers to leave her room.   Objective:  Vital signs in last 24 hours:  Temp:  [98 F (36.7 C)-98.6 F (37 C)] 98.1 F (36.7 C) (04/12 0744) Pulse Rate:  [65-110] 110 (04/12 0800) Resp:  [13-22] 18 (04/12 0800) BP: (122-184)/(67-105) 164/86 mmHg (04/12 0800) SpO2:  [89 %-100 %] 93 % (04/12 0800)  Weight change:  Filed Weights   05/22/15 0810 05/22/15 1321  Weight: 75.297 kg (166 lb) 82 kg (180 lb 12.4 oz)    Intake/Output: I/O last 3 completed shifts: In: 1335 [P.O.:480; I.V.:805; IV Piggyback:50] Out: 6400 [Urine:6400]   Intake/Output this shift:     Physical Exam: General: No acute distress  Head: Normocephalic, atraumatic. Moist oral mucosal membranes  Eyes: Anicteric  Neck: Supple, trachea midline  Lungs:  Rales on the right  Heart: S1S2 no rubs  Abdomen:  Soft, nontender, BS present   Extremities: no peripheral edema.  Neurologic: Nonfocal, moving all four extremities  Skin: No lesions       Basic Metabolic Panel:  Recent Labs Lab 05/22/15 0821  05/22/15 2058 05/23/15 0442 05/24/15 0401 05/24/15 2138 05/25/15 0421  NA 114*  < > 116* 120* 117* 121* 122*  K 2.3*  --  2.4* 3.9 4.1  --  4.1  CL <65*  --  70* 78* 76*  --  81*  CO2 37*  --  35* 33* 31  --  30  GLUCOSE 165*  --  136* 98 156*  --  130*  BUN 6  --  --  10  CREATININE 0.58  --  0.50 0.45 0.47  --  0.53  CALCIUM 9.0  --  8.1* 8.1* 8.1*  --  8.5*  MG 1.4*  --   --  2.4  --   --   --   < > = values in this interval not displayed.  Liver Function Tests:  Recent Labs Lab 05/22/15 0821  AST 48*  ALT 18  ALKPHOS 165*  BILITOT 1.0  PROT 8.0  ALBUMIN 4.3   No results  for input(s): LIPASE, AMYLASE in the last 168 hours. No results for input(s): AMMONIA in the last 168 hours.  CBC:  Recent Labs Lab 05/22/15 0821 05/23/15 0442 05/24/15 0401  WBC 9.3 12.5* 9.3  HGB 17.0* 14.6 14.8  HCT 50.1* 43.3 43.4  MCV 86.6 88.5 88.1  PLT 189 175 164    Cardiac Enzymes:  Recent Labs Lab 05/22/15 0821  CKTOTAL 772*  TROPONINI <0.03    BNP: Invalid input(s): POCBNP  CBG:  Recent Labs Lab 05/22/15 1320  GLUCAP 174*    Microbiology: Results for orders placed or performed during the hospital encounter of 05/22/15  Rapid Influenza A&B Antigens ([facility] only)     Status: None   Collection Time: 05/22/15  8:44 AM  Result Value Ref Range Status   Influenza A ([facility]) NEGATIVE NEGATIVE Final   Influenza B ([facility]) NEGATIVE NEGATIVE Final  Blood culture (routine x 2)     Status: None (Preliminary result)   Collection Time: 05/22/15  8:44 AM  Result Value Ref Range Status   Specimen Description BLOOD LEFT HAND  Final  Special Requests BOTTLES DRAWN AEROBIC AND ANAEROBIC  1CC  Final   Culture NO GROWTH < 24 HOURS  Final   Report Status PENDING  Incomplete  Blood culture (routine x 2)     Status: None (Preliminary result)   Collection Time: 05/22/15  8:44 AM  Result Value Ref Range Status   Specimen Description BLOOD LEFT WRIST  Final   Special Requests BOTTLES DRAWN AEROBIC AND ANAEROBIC  1CC  Final   Culture NO GROWTH < 24 HOURS  Final   Report Status PENDING  Incomplete  Urine culture     Status: Abnormal   Collection Time: 05/22/15 11:31 AM  Result Value Ref Range Status   Specimen Description URINE, RANDOM  Final   Special Requests NONE  Final   Culture >=100,000 COLONIES/mL ESCHERICHIA COLI (A)  Final   Report Status 05/24/2015 FINAL  Final   Organism ID, Bacteria ESCHERICHIA COLI (A)  Final      Susceptibility   Escherichia coli - MIC*    AMPICILLIN >=32 RESISTANT Resistant     CEFAZOLIN 8 SENSITIVE Sensitive     CEFTRIAXONE <=1 SENSITIVE  Sensitive     CIPROFLOXACIN >=4 RESISTANT Resistant     GENTAMICIN <=1 SENSITIVE Sensitive     IMIPENEM <=0.25 SENSITIVE Sensitive     NITROFURANTOIN <=16 SENSITIVE Sensitive     TRIMETH/SULFA <=20 SENSITIVE Sensitive     AMPICILLIN/SULBACTAM 4 SENSITIVE Sensitive     PIP/TAZO <=4 SENSITIVE Sensitive     Extended ESBL NEGATIVE Sensitive     * >=100,000 COLONIES/mL ESCHERICHIA COLI  MRSA PCR Screening     Status: None   Collection Time: 05/22/15  1:19 PM  Result Value Ref Range Status   MRSA by PCR NEGATIVE NEGATIVE Final    Comment:        The GeneXpert MRSA Assay (FDA approved for NASAL specimens only), is one component of a comprehensive MRSA colonization surveillance program. It is not intended to diagnose MRSA infection nor to guide or monitor treatment for MRSA infections.     Coagulation Studies: No results for input(s): LABPROT, INR in the last 72 hours.  Urinalysis:  Recent Labs  05/22/15 1131  COLORURINE YELLOW*  LABSPEC 1.009  PHURINE 6.0  GLUCOSEU NEGATIVE  HGBUR 1+*  BILIRUBINUR NEGATIVE  KETONESUR NEGATIVE  PROTEINUR 30*  NITRITE NEGATIVE  LEUKOCYTESUR 1+*      Imaging: Dg Chest Port 1 View  05/24/2015  CLINICAL DATA:  PICC line placement follow-up study; history of acute on chronic respiratory failure EXAM: PORTABLE CHEST 1 VIEW COMPARISON:  Portable chest x-ray of today's date FINDINGS: The right-sided PICC line tip again extends into the venous system of the lower right neck. The tip is not included in the field of view. The lungs remain hyperinflated. The interstitial markings remain increased especially at the right lung base. There is a small right pleural effusion. The cardiac silhouette remains enlarged. The central pulmonary vascularity is mildly prominent. IMPRESSION: The PICC line tip again extends into the neck on the right and extends beyond the field of view of the study. Repositioning is indicated. These results will be called to the  ordering clinician or representative by the Radiologist Assistant, and communication documented in the PACS or zVision Dashboard. Electronically Signed   By: David  [place] M.D.   On: 05/24/2015 14:29   Dg Chest Port 1 View  05/24/2015  CLINICAL DATA:  75 year old female with PICC line placement. Subsequent encounter. EXAM: PORTABLE CHEST 1 VIEW COMPARISON:  05/24/2015 5:41 a.m. FINDINGS: Right PICC line has been placed and traverses superiorly into the neck. It is possible this curls upon itself although full course of the PICC line is not imaged. This will need to be reposition. Remainder of findings without change since exam earlier today. IMPRESSION: Right PICC line has been placed and traverses superiorly into the neck. It is possible this curls upon itself although full course of the PICC line is not imaged. This will need to be reposition. These results will be called to the ordering clinician or representative by the Radiologist Assistant, and communication documented in the PACS or zVision Dashboard. Electronically Signed   By: Lacy DuverneySteven  Olson M.D.   On: 05/24/2015 13:25   Dg Chest Port 1 View  05/24/2015  CLINICAL DATA:  Respiratory failure, acute and chronic; history of COPD -asthma EXAM: PORTABLE CHEST 1 VIEW COMPARISON:  CT scan of the chest of May 22, 2015 FINDINGS: The lungs are well-expanded. The interstitial markings are coarse bilaterally. Increased interstitial density in the right infrahilar region is present which corresponds to parenchymal abnormalities in the right middle and lower lobes on the earlier chest CT scan. The cardiac silhouette is mildly enlarged. The pulmonary vascularity is not engorged. There is calcification in the wall of the thoracic aorta. IMPRESSION: COPD.  Bibasilar atelectasis or pneumonia greatest on the right. Electronically Signed   By: David  [place] M.D.   On: 05/24/2015 07:26     Medications:     . atenolol  50 mg Oral Daily  . budesonide (PULMICORT)  nebulizer solution  0.5 mg Nebulization BID  . doxycycline  100 mg Oral Q12H  . enoxaparin (LOVENOX) injection  40 mg Subcutaneous Q24H  . famotidine  20 mg Oral Daily  . gabapentin  100 mg Oral Daily  . ipratropium-albuterol  3 mL Nebulization Q6H  . phenytoin  300 mg Oral QHS  . sennosides-docusate sodium  1 tablet Oral Daily  . sertraline  100 mg Oral Daily  . sodium chloride flush  3 mL Intravenous Q12H   acetaminophen **OR** [DISCONTINUED] acetaminophen, albuterol, ALPRAZolam, baclofen, haloperidol lactate, hydrALAZINE, morphine injection, ondansetron **OR** ondansetron (ZOFRAN) IV, oxyCODONE  Assessment/ Plan:  75 y.o. female with past medical history of COPD, hypertension, seizure disorder, peripheral neuropathy, anxiety, depression, history of psychogenic polydipsia who presented to [facility] with mechanical [facility]. Patient known to us from prior admissions as we saw her for hyponatremia in 2014.   1.  Hyponatremia, with prior hx of psychogenic polydipsia.  2.  Acute respiratory failure. 3.  Hypokalemia. 4.  Hypomagnesemia.   Plan:  Patient significantly confused today. Unclear if this is related to his serum sodium being low. However when she had a serum sodium of 117 she was more lucid. Therefore this could be secondary to steroid therapy. We will reinitiate 3% saline at 20 cc per hour. Continue to monitor serum sodium closely. Target sodium within the next 24 hours is 130.   LOS: 3 Candace Woods 4/12/20179:32 AM

## 2015-05-25 NOTE — Progress Notes (Signed)
Very animated and agitated. No respiratory distress. Poorly oriented  Filed Vitals:   05/25/15 1100 05/25/15 1200 05/25/15 1300 05/25/15 1400  BP: 182/156 138/85 137/63 149/94  Pulse: 106 147 143 51  Temp:    98.6 F (37 C)  TempSrc:    Axillary  Resp: 17 19 17 25   Height:      Weight:      SpO2: 94% 93% 92% 93%   [place] 4 lpm  Very frail HEENT WNL No JVD No wheezes Reg, no M NABS, soft No edema  BMP Latest Ref Rng 05/25/2015 05/24/2015 05/24/2015  Glucose 65 - 99 mg/dL 161(W130(H) - 960(A156(H)  BUN 6 - 20 mg/dL 10 - 8  Creatinine 5.400.44 - 1.00 mg/dL 9.810.53 - 1.910.47  Sodium 478135 - 145 mmol/L 122(L) 121(L) 117(LL)  Potassium 3.5 - 5.1 mmol/L 4.1 - 4.1  Chloride 101 - 111 mmol/L 81(L) - 76(L)  CO2 22 - 32 mmol/L 30 - 31  Calcium 8.9 - 10.3 mg/dL 2.9(F8.5(L) - 8.1(L)    CBC Latest Ref Rng 05/24/2015 05/23/2015 05/22/2015  WBC 3.6 - 11.0 K/uL 9.3 12.5(H) 9.3  Hemoglobin 12.0 - 16.0 g/dL 62.114.8 30.814.6 17.0(H)  Hematocrit 35.0 - 47.0 % 43.4 43.3 50.1(H)  Platelets 150 - 440 K/uL 164 175 189   Results for orders placed or performed during the hospital encounter of 05/22/15  Rapid Influenza A&B Antigens ([facility] only)     Status: None   Collection Time: 05/22/15  8:44 AM  Result Value Ref Range Status   Influenza A ([facility]) NEGATIVE NEGATIVE Final   Influenza B ([facility]) NEGATIVE NEGATIVE Final  Blood culture (routine x 2)     Status: None (Preliminary result)   Collection Time: 05/22/15  8:44 AM  Result Value Ref Range Status   Specimen Description BLOOD LEFT HAND  Final   Special Requests BOTTLES DRAWN AEROBIC AND ANAEROBIC  1CC  Final   Culture NO GROWTH < 24 HOURS  Final   Report Status PENDING  Incomplete  Blood culture (routine x 2)     Status: None (Preliminary result)   Collection Time: 05/22/15  8:44 AM  Result Value Ref Range Status   Specimen Description BLOOD LEFT WRIST  Final   Special Requests BOTTLES DRAWN AEROBIC AND ANAEROBIC  1CC  Final   Culture NO GROWTH < 24 HOURS  Final   Report Status  PENDING  Incomplete  Urine culture     Status: Abnormal   Collection Time: 05/22/15 11:31 AM  Result Value Ref Range Status   Specimen Description URINE, RANDOM  Final   Special Requests NONE  Final   Culture >=100,000 COLONIES/mL ESCHERICHIA COLI (A)  Final   Report Status 05/24/2015 FINAL  Final   Organism ID, Bacteria ESCHERICHIA COLI (A)  Final      Susceptibility   Escherichia coli - MIC*    AMPICILLIN >=32 RESISTANT Resistant     CEFAZOLIN 8 SENSITIVE Sensitive     CEFTRIAXONE <=1 SENSITIVE Sensitive     CIPROFLOXACIN >=4 RESISTANT Resistant     GENTAMICIN <=1 SENSITIVE Sensitive     IMIPENEM <=0.25 SENSITIVE Sensitive     NITROFURANTOIN <=16 SENSITIVE Sensitive     TRIMETH/SULFA <=20 SENSITIVE Sensitive     AMPICILLIN/SULBACTAM 4 SENSITIVE Sensitive     PIP/TAZO <=4 SENSITIVE Sensitive     Extended ESBL NEGATIVE Sensitive     * >=100,000 COLONIES/mL ESCHERICHIA COLI  MRSA PCR Screening     Status: None   Collection Time: 05/22/15  1:19 PM  Result Value Ref Range Status   MRSA by PCR NEGATIVE NEGATIVE Final    Comment:        The GeneXpert MRSA Assay (FDA approved for NASAL specimens only), is one component of a comprehensive MRSA colonization surveillance program. It is not intended to diagnose MRSA infection nor to guide or monitor treatment for MRSA infections.    Anti-infectives    Start     Dose/Rate Route Frequency Ordered Stop   05/25/15 1000  doxycycline (VIBRA-TABS) tablet 100 mg     100 mg Oral Every 12 hours 05/25/15 0928 05/30/15 0959   05/23/15 1800  cefTRIAXone (ROCEPHIN) 1 g in dextrose 5 % 50 mL IVPB  Status:  Discontinued     1 g 100 mL/hr over 30 Minutes Intravenous Every 24 hours 05/23/15 1457 05/25/15 0928   05/23/15 1000  azithromycin (ZITHROMAX) tablet 250 mg  Status:  Discontinued     250 mg Oral Daily 05/22/15 1431 05/23/15 0905   05/22/15 1630  cefTRIAXone (ROCEPHIN) 1 g in dextrose 5 % 50 mL IVPB  Status:  Discontinued     1 g 100  mL/hr over 30 Minutes Intravenous Every 24 hours 05/22/15 1553 05/23/15 1457   05/22/15 1445  azithromycin (ZITHROMAX) tablet 500 mg     500 mg Oral Daily 05/22/15 1431 05/22/15 1606      CXR: NNF  IMPRESSION: 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, resolving Possible PNA Hyponatremia - 3% saline initiated 04/11 Hypokalemia - resolved Protein-calorie malnutrition Adult FTT Severe agitated delirium - suspect related to systemic steroids  PLAN/REC: Cont SDU through today due to agitation Cont supplemental O2 Cont nebulized steroids and bronchodilators DC methylprednisolone Monitor BMET intermittently Monitor I/Os Correct electrolytes as indicated Midline catheter placed 04/11 for hypertonic saline Monitor temp, WBC count Micro and abx as above   Billy Fischer, MD PCCM service Mobile [phone] Pager [phone] 05/25/2015

## 2015-05-25 NOTE — Progress Notes (Signed)
Notified Dr. Bard HerbertSimmonds that patient is agitated and refusing PO medicines and her breakfast.  She keeps yelling that she wants her preacher here for a meeting to talk to those who are unbelievers and have them "saved."  She has called 911 and [place] police because she states that she needs Order to discontinue foley but patient refuses for me to take her foley out.

## 2015-05-25 NOTE — Progress Notes (Signed)
Chaplain rounded the unit and provided a compassionate presence and spiritual support to the patient.  Chaplain Keiasha Diep [phone] 

## 2015-05-26 ENCOUNTER — Inpatient Hospital Stay: Payer: Medicare (Managed Care)

## 2015-05-26 DIAGNOSIS — R410 Disorientation, unspecified: Secondary | ICD-10-CM

## 2015-05-26 DIAGNOSIS — F29 Unspecified psychosis not due to a substance or known physiological condition: Secondary | ICD-10-CM

## 2015-05-26 DIAGNOSIS — I480 Paroxysmal atrial fibrillation: Secondary | ICD-10-CM

## 2015-05-26 DIAGNOSIS — R631 Polydipsia: Secondary | ICD-10-CM

## 2015-05-26 DIAGNOSIS — F54 Psychological and behavioral factors associated with disorders or diseases classified elsewhere: Secondary | ICD-10-CM

## 2015-05-26 DIAGNOSIS — R569 Unspecified convulsions: Secondary | ICD-10-CM

## 2015-05-26 LAB — BASIC METABOLIC PANEL
ANION GAP: 8 (ref 5–15)
BUN: 13 mg/dL (ref 6–20)
CALCIUM: 8.9 mg/dL (ref 8.9–10.3)
CO2: 31 mmol/L (ref 22–32)
Chloride: 89 mmol/L — ABNORMAL LOW (ref 101–111)
Creatinine, Ser: 0.54 mg/dL (ref 0.44–1.00)
Glucose, Bld: 138 mg/dL — ABNORMAL HIGH (ref 65–99)
POTASSIUM: 3.3 mmol/L — AB (ref 3.5–5.1)
Sodium: 128 mmol/L — ABNORMAL LOW (ref 135–145)

## 2015-05-26 LAB — SODIUM: SODIUM: 127 mmol/L — AB (ref 135–145)

## 2015-05-26 MED ORDER — POTASSIUM CHLORIDE 10 MEQ/50ML IV SOLN
10.0000 meq | INTRAVENOUS | Status: AC
  Administered 2015-05-26 (×4): 10 meq via INTRAVENOUS
  Filled 2015-05-26 (×4): qty 50

## 2015-05-26 MED ORDER — HYDRALAZINE HCL 20 MG/ML IJ SOLN
10.0000 mg | INTRAMUSCULAR | Status: DC | PRN
  Administered 2015-05-26: 20 mg via INTRAVENOUS
  Administered 2015-05-26: 40 mg via INTRAVENOUS
  Filled 2015-05-26: qty 2
  Filled 2015-05-26: qty 1

## 2015-05-26 MED ORDER — SODIUM CHLORIDE 0.9% FLUSH
10.0000 mL | INTRAVENOUS | Status: DC | PRN
  Administered 2015-05-28: 10 mL
  Filled 2015-05-26: qty 40

## 2015-05-26 MED ORDER — PHENYTOIN SODIUM 50 MG/ML IJ SOLN
100.0000 mg | Freq: Three times a day (TID) | INTRAMUSCULAR | Status: DC
  Administered 2015-05-26 – 2015-05-30 (×14): 100 mg via INTRAVENOUS
  Filled 2015-05-26 (×20): qty 2

## 2015-05-26 MED ORDER — METOPROLOL TARTRATE 1 MG/ML IV SOLN
5.0000 mg | Freq: Once | INTRAVENOUS | Status: AC
  Administered 2015-05-26: 5 mg via INTRAVENOUS

## 2015-05-26 MED ORDER — AMIODARONE HCL IN DEXTROSE 360-4.14 MG/200ML-% IV SOLN
60.0000 mg/h | INTRAVENOUS | Status: DC
  Administered 2015-05-26 (×2): 60 mg/h via INTRAVENOUS
  Filled 2015-05-26: qty 200

## 2015-05-26 MED ORDER — LORAZEPAM 2 MG/ML IJ SOLN
1.0000 mg | Freq: Four times a day (QID) | INTRAMUSCULAR | Status: DC
  Administered 2015-05-27 – 2015-05-30 (×13): 1 mg via INTRAVENOUS
  Filled 2015-05-26 (×13): qty 1

## 2015-05-26 MED ORDER — METOPROLOL TARTRATE 1 MG/ML IV SOLN
INTRAVENOUS | Status: AC
  Administered 2015-05-26: 5 mg via INTRAVENOUS
  Filled 2015-05-26: qty 5

## 2015-05-26 MED ORDER — HALOPERIDOL LACTATE 5 MG/ML IJ SOLN
1.0000 mg | INTRAMUSCULAR | Status: DC | PRN
  Administered 2015-06-02: 2 mg via INTRAVENOUS
  Filled 2015-05-26: qty 1

## 2015-05-26 MED ORDER — METOPROLOL TARTRATE 1 MG/ML IV SOLN
2.5000 mg | INTRAVENOUS | Status: DC | PRN
  Administered 2015-05-26: 5 mg via INTRAVENOUS
  Filled 2015-05-26: qty 5

## 2015-05-26 MED ORDER — AMIODARONE HCL IN DEXTROSE 360-4.14 MG/200ML-% IV SOLN
30.0000 mg/h | INTRAVENOUS | Status: DC
  Administered 2015-05-26: 30 mg/h via INTRAVENOUS
  Filled 2015-05-26 (×5): qty 200

## 2015-05-26 MED ORDER — AMIODARONE LOAD VIA INFUSION
150.0000 mg | Freq: Once | INTRAVENOUS | Status: AC
  Administered 2015-05-26: 150 mg via INTRAVENOUS
  Filled 2015-05-26: qty 83.34

## 2015-05-26 MED ORDER — DEXTROSE 5 % IV SOLN
1.0000 g | INTRAVENOUS | Status: DC
  Administered 2015-05-26 – 2015-05-30 (×5): 1 g via INTRAVENOUS
  Filled 2015-05-26 (×5): qty 10

## 2015-05-26 MED ORDER — LORAZEPAM 2 MG/ML IJ SOLN
2.0000 mg | Freq: Once | INTRAMUSCULAR | Status: AC
Start: 2015-05-26 — End: 2015-05-26
  Administered 2015-05-26: 2 mg via INTRAVENOUS
  Filled 2015-05-26: qty 1

## 2015-05-26 NOTE — Progress Notes (Signed)
Patient's HR began to sustain in the 150's, sinus tach via the monitor.  Patient is in no respiratory distress, BP 140's systolic, patient is lying in bed talking quietly and continuously as she has been since the start of this RN's shift.  MD notified, order for 5mg  metoprolol received and given.  HR returned to the lower 100's, patient still in no apparent distress.  Will continue to monitor.

## 2015-05-26 NOTE — Progress Notes (Addendum)
[facility] -  at [facility]                                                                                                                                                                                            Patient Demographics   Candace Woods, is a 75 y.o. female, DOB - 10-02-1940, ZOX:[id number]  Admit date - 05/22/2015   Admitting Physician Wyatt Haste, MD  Outpatient Primary MD for the patient is Bobbye Morton, MD   LOS - 4  Subjective:   continues to be confused but not screaming as much sodium levels improved  Review of Systems:   CONSTITUTIONAL: Confused and agitated  Vitals:   Filed Vitals:   05/26/15 0739 05/26/15 0800 05/26/15 0842 05/26/15 0900  BP:  175/86 182/88 179/97  Pulse:  99  145  Temp:  98.3 F (36.8 C)    TempSrc:  Axillary    Resp:  16  19  Height:      Weight:      SpO2: 95% 95%  93%    Wt Readings from Last 3 Encounters:  05/22/15 82 kg (180 lb 12.4 oz)     Intake/Output Summary (Last 24 hours) at 05/26/15 1104 Last data filed at 05/26/15 1000  Gross per 24 hour  Intake    450 ml  Output   1350 ml  Net   -900 ml    Physical Exam:   GENERAL: Agitated HEAD, EYES, EARS, NOSE AND THROAT: Atraumatic, normocephalic. Extraocular muscles are intact. Pupils equal and reactive to light. Sclerae anicteric. No conjunctival injection. No oro-pharyngeal erythema.  NECK: Supple. There is no jugular venous distention. No bruits, no lymphadenopathy, no thyromegaly.  HEART: Tachycardic. No murmurs, no rubs, no clicks.  LUNGS: Diminished breath sounds bilaterally with some associated muscle usage.  ABDOMEN: Soft, flat, nontender, nondistended. Has good bowel sounds. No hepatosplenomegaly appreciated.  EXTREMITIES: No evidence of any cyanosis, clubbing, or peripheral edema.  +2 pedal and radial pulses bilaterally.  NEUROLOGIC:  Very agitated SKIN: Moist and warm with no rashes appreciated.  Psych: Agitated  LN: No  inguinal LN enlargement    Antibiotics   Anti-infectives    Start     Dose/Rate Route Frequency Ordered Stop   05/25/15 1000  doxycycline (VIBRA-TABS) tablet 100 mg  Status:  Discontinued     100 mg Oral Every 12 hours 05/25/15 0928 05/26/15 0811   05/23/15 1800  cefTRIAXone (ROCEPHIN) 1 g in dextrose 5 % 50 mL IVPB  Status:  Discontinued     1 g 100 mL/hr over 30 Minutes Intravenous Every 24 hours  05/23/15 1457 05/25/15 0928   05/23/15 1000  azithromycin (ZITHROMAX) tablet 250 mg  Status:  Discontinued     250 mg Oral Daily 05/22/15 1431 05/23/15 0905   05/22/15 1630  cefTRIAXone (ROCEPHIN) 1 g in dextrose 5 % 50 mL IVPB  Status:  Discontinued     1 g 100 mL/hr over 30 Minutes Intravenous Every 24 hours 05/22/15 1553 05/23/15 1457   05/22/15 1445  azithromycin (ZITHROMAX) tablet 500 mg     500 mg Oral Daily 05/22/15 1431 05/22/15 1606      Medications   Scheduled Meds: . amiodarone  150 mg Intravenous Once  . antiseptic oral rinse  7 mL Mouth Rinse BID  . budesonide (PULMICORT) nebulizer solution  0.25 mg Nebulization BID  . enoxaparin (LOVENOX) injection  40 mg Subcutaneous Q24H  . ipratropium-albuterol  3 mL Nebulization Q6H  . phenytoin (DILANTIN) IV  100 mg Intravenous 3 times per day  . potassium chloride  10 mEq Intravenous Q1 Hr x 4  . sodium chloride flush  3 mL Intravenous Q12H   Continuous Infusions: . amiodarone 60 mg/hr (05/26/15 1054)   Followed by  . amiodarone     PRN Meds:.acetaminophen **OR** [DISCONTINUED] acetaminophen, albuterol, haloperidol lactate, hydrALAZINE, metoprolol, morphine injection, ondansetron **OR** ondansetron (ZOFRAN) IV   Data Review:   Micro Results Recent Results (from the past 240 hour(s))  Rapid Influenza A&B Antigens ([facility] only)     Status: None   Collection Time: 05/22/15  8:44 AM  Result Value Ref Range Status   Influenza A ([facility]) NEGATIVE NEGATIVE Final   Influenza B ([facility]) NEGATIVE NEGATIVE Final  Blood culture (routine  x 2)     Status: None (Preliminary result)   Collection Time: 05/22/15  8:44 AM  Result Value Ref Range Status   Specimen Description BLOOD LEFT HAND  Final   Special Requests BOTTLES DRAWN AEROBIC AND ANAEROBIC  1CC  Final   Culture NO GROWTH 4 DAYS  Final   Report Status PENDING  Incomplete  Blood culture (routine x 2)     Status: None (Preliminary result)   Collection Time: 05/22/15  8:44 AM  Result Value Ref Range Status   Specimen Description BLOOD LEFT WRIST  Final   Special Requests BOTTLES DRAWN AEROBIC AND ANAEROBIC  1CC  Final   Culture NO GROWTH 4 DAYS  Final   Report Status PENDING  Incomplete  Urine culture     Status: Abnormal   Collection Time: 05/22/15 11:31 AM  Result Value Ref Range Status   Specimen Description URINE, RANDOM  Final   Special Requests NONE  Final   Culture >=100,000 COLONIES/mL ESCHERICHIA COLI (A)  Final   Report Status 05/24/2015 FINAL  Final   Organism ID, Bacteria ESCHERICHIA COLI (A)  Final      Susceptibility   Escherichia coli - MIC*    AMPICILLIN >=32 RESISTANT Resistant     CEFAZOLIN 8 SENSITIVE Sensitive     CEFTRIAXONE <=1 SENSITIVE Sensitive     CIPROFLOXACIN >=4 RESISTANT Resistant     GENTAMICIN <=1 SENSITIVE Sensitive     IMIPENEM <=0.25 SENSITIVE Sensitive     NITROFURANTOIN <=16 SENSITIVE Sensitive     TRIMETH/SULFA <=20 SENSITIVE Sensitive     AMPICILLIN/SULBACTAM 4 SENSITIVE Sensitive     PIP/TAZO <=4 SENSITIVE Sensitive     Extended ESBL NEGATIVE Sensitive     * >=100,000 COLONIES/mL ESCHERICHIA COLI  MRSA PCR Screening     Status: None   Collection Time: 05/22/15  1:19 PM  Result Value Ref Range Status   MRSA by PCR NEGATIVE NEGATIVE Final    Comment:        The GeneXpert MRSA Assay (FDA approved for NASAL specimens only), is one component of a comprehensive MRSA colonization surveillance program. It is not intended to diagnose MRSA infection nor to guide or monitor treatment for MRSA infections.      Radiology Reports Ct Head Wo Contrast  05/22/2015  CLINICAL DATA:  Fall last evening.  Hypoxic and confusion.  COPD. EXAM: CT HEAD WITHOUT CONTRAST TECHNIQUE: Contiguous axial images were obtained from the base of the skull through the vertex without contrast. COMPARISON:  12/22/2007 FINDINGS: Subtle low-density in the subcortical white matter. There is focal white matter disease involving the left external capsule and the left white matter tracts. This could represent ischemic changes of unknown age. No evidence for acute hemorrhage, mass lesion, midline shift, hydrocephalus or large new infarct. No calvarial fracture. Visualized paranasal sinuses are clear. IMPRESSION: No acute intracranial abnormality. Age indeterminate white matter disease, particularly on the left side as described. Findings may represent old ischemic changes. Electronically Signed   By: Richarda Overlie M.D.   On: 05/22/2015 09:23   Ct Angio Chest Pe W/cm &/or Wo Cm  05/22/2015  CLINICAL DATA:  Recent fall with hypoxia and shortness of Breath EXAM: CT ANGIOGRAPHY CHEST WITH CONTRAST TECHNIQUE: Multidetector CT imaging of the chest was performed using the standard protocol during bolus administration of intravenous contrast. Multiplanar CT image reconstructions and MIPs were obtained to evaluate the vascular anatomy. CONTRAST:  75 mL Isovue 370. COMPARISON:  None. FINDINGS: The lungs are well aerated bilaterally. Mild patchy infiltrate is noted in the right middle and right lower lobes. Some atelectatic changes are noted in the lingula. Diffuse emphysematous changes are seen with apical scarring on the right. Some chronic scarring in the right lower lobe is noted as well. The thoracic inlet is within normal limits. The thoracic aorta shows diffuse calcifications without aneurysmal dilatation or dissection. Atherosclerotic plaque is noted in the descending aorta. The pulmonary artery is well visualized and demonstrates a normal branching  pattern. No filling defect is seen to suggest pulmonary embolism. Mild calcified hilar lymph nodes are seen consistent with prior granulomatous disease. No significant lymphadenopathy is seen. The visualized upper abdomen shows calcified splenic granulomas. No other focal abnormality is noted. The osseous structures are within normal limits. Review of the MIP images confirms the above findings. IMPRESSION: No evidence of pulmonary emboli. Diffuse emphysematous changes and changes of prior granulomatous disease. Patchy infiltrate in the right middle and right lower lobes. Electronically Signed   By: Alcide Clever M.D.   On: 05/22/2015 15:16   US Venous Img Lower Unilateral Left  05/22/2015  CLINICAL DATA:  Left lower extremity edema. EXAM: Or the hips LOWER EXTREMITY VENOUS DOPPLER ULTRASOUND TECHNIQUE: Gray-scale sonography with graded compression, as well as color Doppler and duplex ultrasound were performed to evaluate the lower extremity deep venous systems from the level of the common femoral vein and including the common femoral, femoral, profunda femoral, popliteal and calf veins including the posterior tibial, peroneal and gastrocnemius veins when visible. The superficial great saphenous vein was also interrogated. Spectral Doppler was utilized to evaluate flow at rest and with distal augmentation maneuvers in the common femoral, femoral and popliteal veins. COMPARISON:  None. FINDINGS: Contralateral Common Femoral Vein: Respiratory phasicity is normal and symmetric with the symptomatic side. No evidence of thrombus. Normal compressibility. Common Femoral  Vein: No evidence of thrombus. Normal compressibility, respiratory phasicity and response to augmentation. Saphenofemoral Junction: No evidence of thrombus. Normal compressibility and flow on color Doppler imaging. Profunda Femoral Vein: No evidence of thrombus. Normal compressibility and flow on color Doppler imaging. Femoral Vein: No evidence of thrombus.  Normal compressibility, respiratory phasicity and response to augmentation. Popliteal Vein: No evidence of thrombus. Normal compressibility, respiratory phasicity and response to augmentation. Calf Veins: No evidence of thrombus. Normal compressibility and flow on color Doppler imaging. Superficial Great Saphenous Vein: No evidence of thrombus. Normal compressibility and flow on color Doppler imaging. Venous Reflux:  None. Other Findings:  None. IMPRESSION: No evidence of deep venous thrombosis. Electronically Signed   By: Amie Portland M.D.   On: 05/22/2015 15:37   Dg Chest Port 1 View  05/26/2015  CLINICAL DATA:  Respiratory failure. EXAM: PORTABLE CHEST 1 VIEW COMPARISON:  May 24, 2015. FINDINGS: Stable cardiomediastinal silhouette. Atherosclerosis of thoracic aorta is noted. No pneumothorax or significant pleural effusion is noted. Increased bibasilar opacities are noted concerning for atelectasis or possibly pneumonia. Bony thorax is unremarkable. IMPRESSION: Increased bibasilar opacities are noted concerning for worsening atelectasis or possibly pneumonia. Electronically Signed   By: Lupita Raider, M.D.   On: 05/26/2015 07:44   Dg Chest Port 1 View  05/24/2015  CLINICAL DATA:  PICC line placement follow-up study; history of acute on chronic respiratory failure EXAM: PORTABLE CHEST 1 VIEW COMPARISON:  Portable chest x-ray of today's date FINDINGS: The right-sided PICC line tip again extends into the venous system of the lower right neck. The tip is not included in the field of view. The lungs remain hyperinflated. The interstitial markings remain increased especially at the right lung base. There is a small right pleural effusion. The cardiac silhouette remains enlarged. The central pulmonary vascularity is mildly prominent. IMPRESSION: The PICC line tip again extends into the neck on the right and extends beyond the field of view of the study. Repositioning is indicated. These results will be called  to the ordering clinician or representative by the Radiologist Assistant, and communication documented in the PACS or zVision Dashboard. Electronically Signed   By: David  [place] M.D.   On: 05/24/2015 14:29   Dg Chest Port 1 View  05/24/2015  CLINICAL DATA:  75 year old female with PICC line placement. Subsequent encounter. EXAM: PORTABLE CHEST 1 VIEW COMPARISON:  05/24/2015 5:41 a.m. FINDINGS: Right PICC line has been placed and traverses superiorly into the neck. It is possible this curls upon itself although full course of the PICC line is not imaged. This will need to be reposition. Remainder of findings without change since exam earlier today. IMPRESSION: Right PICC line has been placed and traverses superiorly into the neck. It is possible this curls upon itself although full course of the PICC line is not imaged. This will need to be reposition. These results will be called to the ordering clinician or representative by the Radiologist Assistant, and communication documented in the PACS or zVision Dashboard. Electronically Signed   By: Lacy Duverney M.D.   On: 05/24/2015 13:25   Dg Chest Port 1 View  05/24/2015  CLINICAL DATA:  Respiratory failure, acute and chronic; history of COPD -asthma EXAM: PORTABLE CHEST 1 VIEW COMPARISON:  CT scan of the chest of May 22, 2015 FINDINGS: The lungs are well-expanded. The interstitial markings are coarse bilaterally. Increased interstitial density in the right infrahilar region is present which corresponds to parenchymal abnormalities in the right middle and lower  lobes on the earlier chest CT scan. The cardiac silhouette is mildly enlarged. The pulmonary vascularity is not engorged. There is calcification in the wall of the thoracic aorta. IMPRESSION: COPD.  Bibasilar atelectasis or pneumonia greatest on the right. Electronically Signed   By: David  [place] M.D.   On: 05/24/2015 07:26   Dg Chest Portable 1 View  05/22/2015  CLINICAL DATA:  75 year old who had an  unwitnessed fall at her home last night. Current smoker with current history of COPD. EXAM: PORTABLE CHEST 1 VIEW COMPARISON:  CT chest 08/11/2013. Chest x-rays 09/11/2012 and earlier. FINDINGS: Cardiac silhouette upper normal in size to slightly enlarged, unchanged. Emphysematous changes throughout both lungs with prominent bronchovascular markings diffusely and mild central peribronchial thickening, unchanged. Mild atelectasis in the lung bases. Lungs otherwise clear. No pneumothorax. No visible pleural effusions. Osseous demineralization. IMPRESSION: COPD/emphysema. Mild bibasilar atelectasis. No acute cardiopulmonary disease otherwise. Electronically Signed   By: Hulan Saas M.D.   On: 05/22/2015 09:34     CBC  Recent Labs Lab 05/22/15 0821 05/23/15 0442 05/24/15 0401  WBC 9.3 12.5* 9.3  HGB 17.0* 14.6 14.8  HCT 50.1* 43.3 43.4  PLT 189 175 164  MCV 86.6 88.5 88.1  MCH 29.4 29.9 30.0  MCHC 34.0 33.8 34.1  RDW 15.5* 15.7* 16.0*    Chemistries   Recent Labs Lab 05/22/15 0821  05/22/15 2058 05/23/15 0442 05/24/15 0401  05/25/15 0421 05/25/15 1500 05/25/15 1849 05/25/15 2310 05/26/15 0301 05/26/15 0554  NA 114*  < > 116* 120* 117*  < > 122* 122* 125* 126* 127* 128*  K 2.3*  --  2.4* 3.9 4.1  --  4.1  --   --   --   --  3.3*  CL <65*  --  70* 78* 76*  --  81*  --   --   --   --  89*  CO2 37*  --  35* 33* 31  --  30  --   --   --   --  31  GLUCOSE 165*  --  136* 98 156*  --  130*  --   --   --   --  138*  BUN 6  --  --  10  --   --   --   --  13  CREATININE 0.58  --  0.50 0.45 0.47  --  0.53  --   --   --   --  0.54  CALCIUM 9.0  --  8.1* 8.1* 8.1*  --  8.5*  --   --   --   --  8.9  MG 1.4*  --   --  2.4  --   --   --   --   --   --   --   --   AST 48*  --   --   --   --   --   --   --   --   --   --   --   ALT 18  --   --   --   --   --   --   --   --   --   --   --   ALKPHOS 165*  --   --   --   --   --   --   --   --   --   --   --   BILITOT 1.0  --   --    --   --   --   --   --   --   --   --   --   < > =  values in this interval not displayed. ------------------------------------------------------------------------------------------------------------------ estimated creatinine clearance is 62.9 mL/min (by C-G formula based on Cr of 0.54). ------------------------------------------------------------------------------------------------------------------ No results for input(s): HGBA1C in the last 72 hours. ------------------------------------------------------------------------------------------------------------------ No results for input(s): CHOL, HDL, LDLCALC, TRIG, CHOLHDL, LDLDIRECT in the last 72 hours. ------------------------------------------------------------------------------------------------------------------ No results for input(s): TSH, T4TOTAL, T3FREE, THYROIDAB in the last 72 hours.  Invalid input(s): FREET3 ------------------------------------------------------------------------------------------------------------------ No results for input(s): VITAMINB12, FOLATE, FERRITIN, TIBC, IRON, RETICCTPCT in the last 72 hours.  Coagulation profile No results for input(s): INR, PROTIME in the last 168 hours.  No results for input(s): DDIMER in the last 72 hours.  Cardiac Enzymes  Recent Labs Lab 05/22/15 0821  TROPONINI <0.03   ------------------------------------------------------------------------------------------------------------------ Invalid input(s): POCBNP    Assessment & Plan   75 year old Caucasian female history of COPD non-option requiring presenting with mechanical fall and hypoxia  1. Acute on chronic respiratory failure with hypoxia:   acute on chronic COPD exasperation as well as right sided pneumonia Continue 4 L of oxygen Appreciate pulmonary input Antibiotics discontinued per pulmonary Nebulizers   2. Elevated heart rate Patient has been started on amiodarone drip per the ICU doctor  3.  Hyponatremia: Severe  Nephrology following  sodium continues to be elevated  4. Hypokalemia being replaced, magnesium normal  5. Elevated CK without kidney injury: IV fluid hydration   6. Venous thromboembolism prophylactic: Heparin subcutaneous  7.  Acute ecenpahlopathy- has uti with ecoli restart the Rocephin  8. Miscellaneous Lovenox for DVT prophylaxis      Code Status Orders        Start     Ordered   05/22/15 1448  Do not attempt resuscitation (DNR)   Continuous    Question Answer Comment  In the event of cardiac or respiratory ARREST Do not call a "code blue"   In the event of cardiac or respiratory ARREST Do not perform Intubation, CPR, defibrillation or ACLS   In the event of cardiac or respiratory ARREST Use medication by any route, position, wound care, and other measures to relive pain and suffering. May use oxygen, suction and manual treatment of airway obstruction as needed for comfort.      05/22/15 1447    Code Status History    Date Active Date Inactive Code Status Order ID Comments User Context   05/22/2015 11:15 AM 05/22/2015  2:47 PM Full Code [id number]  Wyatt Hasteavid K Hower, MD ED           Consults  none   DVT Prophylaxis  Lovenox   Lab Results  Component Value Date   PLT 164 05/24/2015     Time Spent in minutes  35min critical care very agitated now on amiodarone drip high risk of cardiopulmonary arrest  Greater than 50% of time spent in care coordination and counseling patient regarding the condition and plan of care.   Auburn BilberryPATEL, Eladio Dentremont M.D on 05/26/2015 at 11:04 AM  Between 7am to 6pm - Pager - [phone]  After 6pm go to [email or web address] - password [email or web address] [facility]  [facility]   Office  [phone]

## 2015-05-26 NOTE — Consult Note (Signed)
[facility] Face-to-Face Psychiatry Consult   Reason for Consult:  Consult for this 75 year old woman with a history of past recurrent psychosis current delirium. Confusion and agitation. Referring Physician:  Posey Pronto Patient Identification: GENELLA BAS MRN:  [id number] Principal Diagnosis: Acute delirium Diagnosis:   Patient Active Problem List   Diagnosis Date Noted  . Acute delirium [R41.0] 05/26/2015  . 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 Seizures ([place]) [R56.9] 05/26/2015  . Psychosis [F29] 05/26/2015  . Acute on chronic respiratory failure with hypoxia [facility]) [J96.21] 05/22/2015    Total Time spent with patient: 1 hour  Subjective:   CLEMENCE LENGYEL is a 75 y.o. female patient admitted with patient was not able to offer any subjective information.  HPI:  Patient was interviewed such as I could, exam completed, some neurologic exam, reviewed chart including review of old notes including times that I have seen this patient years ago. Current labs reviewed. 75 year old woman who has a complex past psychiatric and medical history who is in the hospital with multiple problems including hyponatremia. She was in the critical care unit for a while and reportedly was verbal at that time and appeared to be psychotic or delirious ordering people around acting hyper religious. Today she has been transferred to the regular medical floor. When I came to see the patient she appeared to be awake with her eyes open but did not respond verbally to me at all. She was making repetitive complex gestures. One arm was rubbing at the side of the bed while the other was rubbing on her stomach. She was twisted over to one side. She appeared to be able to move her eyes occasionally with some purpose but was not able to vocalize whatsoever and was not able to stop or change the behaviors that she was doing. Nursing reports that she's been acting like this or in similar ways for most of the day. Earlier today they  may have heard some mumbling but have not had any coherent speech out of her. Patient came in with sodium down in the teens, she has gradually come up to the mid 20s. She has been given some when necessary medicines for delirium but doesn't have standing psychiatric medicines that she is given. She came in with a diagnosis pre-stated of seizures. She was prescribed her Dilantin as an oral. Not clear if she's been able to get any today.  Social history: Patient was married last time I was aware of it. I didn't locate any family today. Not clear to me at this time where she lives or what her outpatient situation is.  Medical history: Multiple medical problems. Patient has had recurrent episodes of presentation with hyponatremia which is probably the result of psychogenic polydipsia. It's not clear to me whether the seizures have been only related to hyponatremia or have occurred in other contexts. Patient also has a urinary tract infection recently but that seems to of been treated.  Substance abuse history: No recent description of any substance abuse or clear past substance abuse issues.  Past Psychiatric History: Patient has a history of complex psychiatric symptoms. I remember seeing her before years ago and I looked back over my notes. She had a history of multiple presentations to the hospital for psychogenic polydipsia and would often present as psychotic. During psychotic spell she would sometimes get paranoid and agitated. It did not appear that she was routinely following up with any outpatient psychiatric treatment. Last time I saw her in the hospital  we had started a tiny dose of clozapine in probably going to be discontinued at discharge. Not clear if she's been getting any mental health treatment since then. No known history of suicide attempts. Doesn't appear that she's had psychiatric hospitalization  Risk to Self: Is patient at risk for suicide?: No Risk to Others:   Prior Inpatient  Therapy:   Prior Outpatient Therapy:    Past Medical History:  Past Medical History  Diagnosis Date  . COPD (chronic obstructive pulmonary disease) ([place])   . Hypertension   . Seizures ([place])   . Peripheral neuropathy ([place])   . Torticollis Right  . Anxiety   . Depression   . Psychogenic polydipsia   . MRSA pneumonia ([place])     2014  . Hyperlipemia   . Asthma     Past Surgical History  Procedure Laterality Date  . Abdominal hysterectomy     Family History:  Family History  Problem Relation Age of Onset  . Hypertension Other    Family Psychiatric  History: Patient was not able to offer any history about that right now I don't believe that there is been any known family history. Social History:  History  Alcohol Use No     History  Drug Use No    Social History   Social History  . Marital Status: Widowed    Spouse Name: N/A  . Number of Children: N/A  . Years of Education: N/A   Social History Main Topics  . Smoking status: Current Every Day Smoker -- 2.00 packs/day for 55 years    Types: Cigarettes  . Smokeless tobacco: None  . Alcohol Use: No  . Drug Use: No  . Sexual Activity: Not Asked   Other Topics Concern  . None   Social History Narrative   Additional Social History:    Allergies:   Allergies  Allergen Reactions  . Aspirin   . Codeine   . Contrast Media [Iodinated Diagnostic Agents] Hives    Hives with contrast media injections  . Penicillins   . Sulfa Antibiotics     Labs:  Results for orders placed or performed during the hospital encounter of 05/22/15 (from the past 48 hour(s))  Sodium     Status: Abnormal   Collection Time: 05/24/15  9:38 PM  Result Value Ref Range   Sodium 121 (L) 135 - 145 mmol/L  Basic metabolic panel     Status: Abnormal   Collection Time: 05/25/15  4:21 AM  Result Value Ref Range   Sodium 122 (L) 135 - 145 mmol/L   Potassium 4.1 3.5 - 5.1 mmol/L   Chloride 81 (L) 101 - 111 mmol/L   CO2 30 22 - 32 mmol/L    Glucose, Bld 130 (H) 65 - 99 mg/dL   BUN 10 6 - 20 mg/dL   Creatinine, Ser 0.53 0.44 - 1.00 mg/dL   Calcium 8.5 (L) 8.9 - 10.3 mg/dL   GFR calc non Af Amer >60 >60 mL/min   GFR calc Af Amer >60 >60 mL/min    Comment: (NOTE) The eGFR has been calculated using the CKD EPI equation. This calculation has not been validated in all clinical situations. eGFR's persistently <60 mL/min signify possible Chronic Kidney Disease.    Anion gap 11 5 - 15  Sodium     Status: Abnormal   Collection Time: 05/25/15  3:00 PM  Result Value Ref Range   Sodium 122 (L) 135 - 145 mmol/L  Sodium  Status: Abnormal   Collection Time: 05/25/15  6:49 PM  Result Value Ref Range   Sodium 125 (L) 135 - 145 mmol/L  Sodium     Status: Abnormal   Collection Time: 05/25/15 11:10 PM  Result Value Ref Range   Sodium 126 (L) 135 - 145 mmol/L  Sodium     Status: Abnormal   Collection Time: 05/26/15  3:01 AM  Result Value Ref Range   Sodium 127 (L) 135 - 145 mmol/L  Basic metabolic panel     Status: Abnormal   Collection Time: 05/26/15  5:54 AM  Result Value Ref Range   Sodium 128 (L) 135 - 145 mmol/L   Potassium 3.3 (L) 3.5 - 5.1 mmol/L   Chloride 89 (L) 101 - 111 mmol/L   CO2 31 22 - 32 mmol/L   Glucose, Bld 138 (H) 65 - 99 mg/dL   BUN 13 6 - 20 mg/dL   Creatinine, Ser 0.54 0.44 - 1.00 mg/dL   Calcium 8.9 8.9 - 10.3 mg/dL   GFR calc non Af Amer >60 >60 mL/min   GFR calc Af Amer >60 >60 mL/min    Comment: (NOTE) The eGFR has been calculated using the CKD EPI equation. This calculation has not been validated in all clinical situations. eGFR's persistently <60 mL/min signify possible Chronic Kidney Disease.    Anion gap 8 5 - 15    Current Facility-Administered Medications  Medication Dose Route Frequency Provider Last Rate Last Dose  . acetaminophen (TYLENOL) tablet 650 mg  650 mg Oral Q6H PRN Lytle Butte, MD   650 mg at 05/24/15 2145  . albuterol (PROVENTIL) (2.5 MG/3ML) 0.083% nebulizer solution  2.5 mg  2.5 mg Nebulization Q3H PRN Wilhelmina Mcardle, MD      . amiodarone (NEXTERONE PREMIX) 360 MG/200ML (1.8 mg/mL) IV infusion  30 mg/hr Intravenous Continuous Wilhelmina Mcardle, MD 16.7 mL/hr at 05/26/15 1542 30 mg/hr at 05/26/15 1542  . antiseptic oral rinse (CPC / CETYLPYRIDINIUM CHLORIDE 0.05%) solution 7 mL  7 mL Mouth Rinse BID Wilhelmina Mcardle, MD   7 mL at 05/26/15 1000  . budesonide (PULMICORT) nebulizer solution 0.25 mg  0.25 mg Nebulization BID Wilhelmina Mcardle, MD   0.25 mg at 05/26/15 0739  . cefTRIAXone (ROCEPHIN) 1 g in dextrose 5 % 50 mL IVPB  1 g Intravenous Q24H Dustin Flock, MD   1 g at 05/26/15 1412  . enoxaparin (LOVENOX) injection 40 mg  40 mg Subcutaneous Q24H Wilhelmina Mcardle, MD   40 mg at 05/26/15 0850  . haloperidol lactate (HALDOL) injection 1-2 mg  1-2 mg Intravenous Q3H PRN Wilhelmina Mcardle, MD      . hydrALAZINE (APRESOLINE) injection 10-40 mg  10-40 mg Intravenous Q4H PRN Wilhelmina Mcardle, MD   40 mg at 05/26/15 1216  . ipratropium-albuterol (DUONEB) 0.5-2.5 (3) MG/3ML nebulizer solution 3 mL  3 mL Nebulization Q6H Wilhelmina Mcardle, MD   3 mL at 05/26/15 0739  . LORazepam (ATIVAN) injection 1 mg  1 mg Intravenous Q6H Gonzella Lex, MD      . metoprolol (LOPRESSOR) injection 2.5-5 mg  2.5-5 mg Intravenous Q3H PRN Wilhelmina Mcardle, MD   5 mg at 05/26/15 1245  . morphine 2 MG/ML injection 2 mg  2 mg Intravenous Q4H PRN Lytle Butte, MD   2 mg at 05/26/15 0004  . ondansetron (ZOFRAN) tablet 4 mg  4 mg Oral Q6H PRN Lytle Butte, MD  Or  . ondansetron (ZOFRAN) injection 4 mg  4 mg Intravenous Q6H PRN Lytle Butte, MD      . phenytoin (DILANTIN) injection 100 mg  100 mg Intravenous 3 times per day Wilhelmina Mcardle, MD   100 mg at 05/26/15 1541  . sodium chloride flush (NS) 0.9 % injection 3 mL  3 mL Intravenous Q12H Lytle Butte, MD   3 mL at 05/26/15 1000    Musculoskeletal: Strength & Muscle Tone: spastic Gait & Station: unable to stand Patient leans:  N/A  Psychiatric Specialty Exam: Review of Systems  Unable to perform ROS: medical condition    Blood pressure 123/93, pulse 103, temperature 98.2 F (36.8 C), temperature source Oral, resp. rate 19, height 5' 4"  (1.626 m), weight 71.668 kg (158 lb), SpO2 93 %.Body mass index is 27.11 kg/(m^2).  General Appearance: Disheveled  Eye Contact::  Minimal  Speech:  Negative  Volume:  Decreased  Mood:  Negative  Affect:  Flat  Thought Process:  NA  Orientation:  NA  Thought Content:  WDL  Suicidal Thoughts:  No  Homicidal Thoughts:  No  Memory:  NA  Judgement:  NA  Insight:  NA  Psychomotor Activity:  Mannerisms  Concentration:  Poor  Recall:  Poor  Fund of Knowledge:Poor  Language: Poor  Akathisia:  No  Handed:  Right  AIMS (if indicated):     Assets:  Housing Social Support  ADL's:  Impaired  Cognition: Impaired,  Severe  Sleep:      Treatment Plan Summary: Daily contact with patient to assess and evaluate symptoms and progress in treatment, Medication management and Plan This is a 75 year old woman who presents to me this evening as acutely delirious and confused with repetitive gestures. Differential diagnosis would include partial complex or temporal lobe seizures or catatonic delirium. I have ordered 2 mg of intravenous Ativan to be given stat hoping that we can make an immediate change to her behavior. I've also put in orders for 1 mg of Ativan every 6 hours standing. I am going to have them draw a free in total Dilantin level right now. An immediate EEG is probably out of the question but we can at least see if she has any anticonvulsant in her system. No other initiation of medicine particularly not yet going to start any antipsychotic. I will follow-up while she is in the hospital.  Disposition: Patient does not meet criteria for psychiatric inpatient admission.  Alethia Berthold, MD 05/26/2015 6:48 PM

## 2015-05-26 NOTE — Progress Notes (Signed)
[id number] Dr.Simmonds notified of pt's potassium level of 3.3.  New orders given to start runs of potassium.  0915 Dr.Simmonds notified of pt conversion into afib. New orders given for amiodarone. Dr.Simmonds notified of potassium run started shortly before conversion to afib.  Dr. Bard HerbertSimmonds acknowledged and instructed to continue with amiodarone.   At 1015 pt was observed to have converted back to NSR. Dr. Bard HerbertSimmonds notified. Orders given to continue with amiodarone drip.   Pt continues to be delirious, talking out of her head, praying to Jesus. Pill rolling movement seen in right hand, as well as shaking her hand for no purpose.  Pt is disoriented x4.   Will continue to assess.

## 2015-05-26 NOTE — Progress Notes (Signed)
[facility]  ROUNDING NOTE   Subjective:  Na upto 128 today \\doing  fair 3% stopped this AM Patient mumbles; speech is hard to understand  Sounds like She "wants to make a difference"   Objective:  Vital signs in last 24 hours:  Temp:  [98.2 F (36.8 C)-98.6 F (37 C)] 98.3 F (36.8 C) (04/13 0800) Pulse Rate:  [51-147] 145 (04/13 0900) Resp:  [14-26] 19 (04/13 0900) BP: (137-182)/(63-156) 179/97 mmHg (04/13 0900) SpO2:  [90 %-96 %] 93 % (04/13 0900)  Weight change:  Filed Weights   05/22/15 0810 05/22/15 1321  Weight: 75.297 kg (166 lb) 82 kg (180 lb 12.4 oz)    Intake/Output: I/O last 3 completed shifts: In: 537 [I.V.:537] Out: 2800 [Urine:2800]   Intake/Output this shift:  Total I/O In: 50 [IV Piggyback:50] Out: -   Physical Exam: General: No acute distress  Head: Normocephalic, atraumatic. Dry oral mucosal membranes  Eyes: Anicteric  Neck: Supple, trachea midline  Lungs:  Rales on the right  Heart: S1S2 no rubs, irregular tachycardic  Abdomen:  Soft, nontender, BS present   Extremities: no peripheral edema.  Neurologic: Nonfocal, moving all four extremities  Skin: No lesions  Psych mumbles    Basic Metabolic Panel:  Recent Labs Lab 05/22/15 0821  05/22/15 2058 05/23/15 0442 05/24/15 0401  05/25/15 0421 05/25/15 1500 05/25/15 1849 05/25/15 2310 05/26/15 0301 05/26/15 0554  NA 114*  < > 116* 120* 117*  < > 122* 122* 125* 126* 127* 128*  K 2.3*  --  2.4* 3.9 4.1  --  4.1  --   --   --   --  3.3*  CL <65*  --  70* 78* 76*  --  81*  --   --   --   --  89*  CO2 37*  --  35* 33* 31  --  30  --   --   --   --  31  GLUCOSE 165*  --  136* 98 156*  --  130*  --   --   --   --  138*  BUN 6  --  6 7 8   --  10  --   --   --   --  13  CREATININE 0.58  --  0.50 0.45 0.47  --  0.53  --   --   --   --  0.54  CALCIUM 9.0  --  8.1* 8.1* 8.1*  --  8.5*  --   --   --   --  8.9  MG 1.4*  --   --  2.4  --   --   --   --   --   --   --   --   < > =  values in this interval not displayed.  Liver Function Tests:  Recent Labs Lab 05/22/15 0821  AST 48*  ALT 18  ALKPHOS 165*  BILITOT 1.0  PROT 8.0  ALBUMIN 4.3   No results for input(s): LIPASE, AMYLASE in the last 168 hours. No results for input(s): AMMONIA in the last 168 hours.  CBC:  Recent Labs Lab 05/22/15 0821 05/23/15 0442 05/24/15 0401  WBC 9.3 12.5* 9.3  HGB 17.0* 14.6 14.8  HCT 50.1* 43.3 43.4  MCV 86.6 88.5 88.1  PLT 189 175 164    Cardiac Enzymes:  Recent Labs Lab 05/22/15 0821  CKTOTAL 772*  TROPONINI <0.03    BNP: Invalid input(s): POCBNP  CBG:  Recent Labs Lab 05/22/15 1320  GLUCAP 174*    Microbiology: Results for orders placed or performed during the hospital encounter of 05/22/15  Rapid Influenza A&B Antigens ([facility] only)     Status: None   Collection Time: 05/22/15  8:44 AM  Result Value Ref Range Status   Influenza A ([facility]) NEGATIVE NEGATIVE Final   Influenza B ([facility]) NEGATIVE NEGATIVE Final  Blood culture (routine x 2)     Status: None (Preliminary result)   Collection Time: 05/22/15  8:44 AM  Result Value Ref Range Status   Specimen Description BLOOD LEFT HAND  Final   Special Requests BOTTLES DRAWN AEROBIC AND ANAEROBIC  1CC  Final   Culture NO GROWTH 4 DAYS  Final   Report Status PENDING  Incomplete  Blood culture (routine x 2)     Status: None (Preliminary result)   Collection Time: 05/22/15  8:44 AM  Result Value Ref Range Status   Specimen Description BLOOD LEFT WRIST  Final   Special Requests BOTTLES DRAWN AEROBIC AND ANAEROBIC  1CC  Final   Culture NO GROWTH 4 DAYS  Final   Report Status PENDING  Incomplete  Urine culture     Status: Abnormal   Collection Time: 05/22/15 11:31 AM  Result Value Ref Range Status   Specimen Description URINE, RANDOM  Final   Special Requests NONE  Final   Culture >=100,000 COLONIES/mL ESCHERICHIA COLI (A)  Final   Report Status 05/24/2015 FINAL  Final   Organism ID, Bacteria  ESCHERICHIA COLI (A)  Final      Susceptibility   Escherichia coli - MIC*    AMPICILLIN >=32 RESISTANT Resistant     CEFAZOLIN 8 SENSITIVE Sensitive     CEFTRIAXONE <=1 SENSITIVE Sensitive     CIPROFLOXACIN >=4 RESISTANT Resistant     GENTAMICIN <=1 SENSITIVE Sensitive     IMIPENEM <=0.25 SENSITIVE Sensitive     NITROFURANTOIN <=16 SENSITIVE Sensitive     TRIMETH/SULFA <=20 SENSITIVE Sensitive     AMPICILLIN/SULBACTAM 4 SENSITIVE Sensitive     PIP/TAZO <=4 SENSITIVE Sensitive     Extended ESBL NEGATIVE Sensitive     * >=100,000 COLONIES/mL ESCHERICHIA COLI  MRSA PCR Screening     Status: None   Collection Time: 05/22/15  1:19 PM  Result Value Ref Range Status   MRSA by PCR NEGATIVE NEGATIVE Final    Comment:        The GeneXpert MRSA Assay (FDA approved for NASAL specimens only), is one component of a comprehensive MRSA colonization surveillance program. It is not intended to diagnose MRSA infection nor to guide or monitor treatment for MRSA infections.     Coagulation Studies: No results for input(s): LABPROT, INR in the last 72 hours.  Urinalysis: No results for input(s): COLORURINE, LABSPEC, PHURINE, GLUCOSEU, HGBUR, BILIRUBINUR, KETONESUR, PROTEINUR, UROBILINOGEN, NITRITE, LEUKOCYTESUR in the last 72 hours.  Invalid input(s): APPERANCEUR    Imaging: Dg Chest Port 1 View  05/26/2015  CLINICAL DATA:  Respiratory failure. EXAM: PORTABLE CHEST 1 VIEW COMPARISON:  May 24, 2015. FINDINGS: Stable cardiomediastinal silhouette. Atherosclerosis of thoracic aorta is noted. No pneumothorax or significant pleural effusion is noted. Increased bibasilar opacities are noted concerning for atelectasis or possibly pneumonia. Bony thorax is unremarkable. IMPRESSION: Increased bibasilar opacities are noted concerning for worsening atelectasis or possibly pneumonia. Electronically Signed   By: Lupita Raider, M.D.   On: 05/26/2015 07:44   Dg Chest Port 1 View  05/24/2015  CLINICAL  DATA:  PICC line  placement follow-up study; history of acute on chronic respiratory failure EXAM: PORTABLE CHEST 1 VIEW COMPARISON:  Portable chest x-ray of today's date FINDINGS: The right-sided PICC line tip again extends into the venous system of the lower right neck. The tip is not included in the field of view. The lungs remain hyperinflated. The interstitial markings remain increased especially at the right lung base. There is a small right pleural effusion. The cardiac silhouette remains enlarged. The central pulmonary vascularity is mildly prominent. IMPRESSION: The PICC line tip again extends into the neck on the right and extends beyond the field of view of the study. Repositioning is indicated. These results will be called to the ordering clinician or representative by the Radiologist Assistant, and communication documented in the PACS or zVision Dashboard. Electronically Signed   By: David  [place] M.D.   On: 05/24/2015 14:29   Dg Chest Port 1 View  05/24/2015  CLINICAL DATA:  75 year old female with PICC line placement. Subsequent encounter. EXAM: PORTABLE CHEST 1 VIEW COMPARISON:  05/24/2015 5:41 a.m. FINDINGS: Right PICC line has been placed and traverses superiorly into the neck. It is possible this curls upon itself although full course of the PICC line is not imaged. This will need to be reposition. Remainder of findings without change since exam earlier today. IMPRESSION: Right PICC line has been placed and traverses superiorly into the neck. It is possible this curls upon itself although full course of the PICC line is not imaged. This will need to be reposition. These results will be called to the ordering clinician or representative by the Radiologist Assistant, and communication documented in the PACS or zVision Dashboard. Electronically Signed   By: Lacy Duverney M.D.   On: 05/24/2015 13:25     Medications:   . amiodarone     Followed by  . amiodarone     . amiodarone  150 mg  Intravenous Once  . antiseptic oral rinse  7 mL Mouth Rinse BID  . budesonide (PULMICORT) nebulizer solution  0.25 mg Nebulization BID  . enoxaparin (LOVENOX) injection  40 mg Subcutaneous Q24H  . ipratropium-albuterol  3 mL Nebulization Q6H  . phenytoin (DILANTIN) IV  100 mg Intravenous 3 times per day  . potassium chloride  10 mEq Intravenous Q1 Hr x 4  . sodium chloride flush  3 mL Intravenous Q12H   acetaminophen **OR** [DISCONTINUED] acetaminophen, albuterol, haloperidol lactate, hydrALAZINE, metoprolol, morphine injection, ondansetron **OR** ondansetron (ZOFRAN) IV  Assessment/ Plan:  75 y.o. female with past medical history of COPD, hypertension, seizure disorder, peripheral neuropathy, anxiety, depression, history of psychogenic polydipsia who presented to [facility] with mechanical fall. Patient known to [place] from prior admissions as we saw her for hyponatremia in 2014.   1.  Hyponatremia, with prior hx of psychogenic polydipsia.  2.  Acute respiratory failure. 3.  Hypokalemia. 4.  Hypomagnesemia.  5.  UTI, E Coli  Plan:  It is possible that UTI caused dehydration and led to hyponatremia 3% was stopped earlier today Recommend maintenance dose of saline @ 50/hr and continue to monitor Na  Agree with Psych evaluation Ask family about alcohol use   LOS: 4 Beulah Capobianco 4/13/20179:41 AM

## 2015-05-26 NOTE — Progress Notes (Signed)
Patient transferred from ICU, oriented to room, fall, and safety contract reviewed. Focused assessment as charted. No complaints at this time. Skin verified with Raeanne GathersAshley W, RN. Telemetry box verified. Trudee KusterBrandi R Mansfield

## 2015-05-26 NOTE — Care Management (Signed)
Barriers to discharge: increasing confusion. She is calling 911 from the room and banishing non believers from her room.   Amiodarone drip started today.  Orders to transfer out of the unit.  With present mental staus, would doubt that patient care could be managed in the home.  [facility] is aware.

## 2015-05-26 NOTE — Progress Notes (Signed)
Remains profoundly delirious. No respiratory distress. Refusing most therapies. Transient paroxysmal AF with RVR this AM  Filed Vitals:   05/26/15 0900 05/26/15 1000 05/26/15 1100 05/26/15 1216  BP: 179/97 189/166 143/80 172/90  Pulse: 145 113 111   Temp:      TempSrc:      Resp: Height:      Weight:      SpO2: 93% 96% 96%    [place] 4 lpm  [place]rail HEENT WNL No wheezes Reg, no M NABS, soft No edema  BMP Latest Ref Rng 05/26/2015 05/26/2015 05/25/2015  Glucose 65 - 99 mg/dL 161(W) - -  BUN 6 - 20 mg/dL 13 - -  Creatinine 9.60 - 1.00 mg/dL 4.54 - -  Sodium 098 - 145 mmol/L 128(L) 127(L) 126(L)  Potassium 3.5 - 5.1 mmol/L 3.3(L) - -  Chloride 101 - 111 mmol/L 89(L) - -  CO2 22 - 32 mmol/L 31 - -  Calcium 8.9 - 10.3 mg/dL 8.9 - -    CBC Latest Ref Rng 05/24/2015 05/23/2015 05/22/2015  WBC 3.6 - 11.0 K/uL 9.3 12.5(H) 9.3  Hemoglobin 12.0 - 16.0 g/dL 11.9 14.7 17.0(H)  Hematocrit 35.0 - 47.0 % 43.4 43.3 50.1(H)  Platelets 150 - 440 K/uL 164 175 189   Results for orders placed or performed during the hospital encounter of 05/22/15  Rapid Influenza A&B Antigens ([facility] only)     Status: None   Collection Time: 05/22/15  8:44 AM  Result Value Ref Range Status   Influenza A ([facility]) NEGATIVE NEGATIVE Final   Influenza B ([facility]) NEGATIVE NEGATIVE Final  Blood culture (routine x 2)     Status: None (Preliminary result)   Collection Time: 05/22/15  8:44 AM  Result Value Ref Range Status   Specimen Description BLOOD LEFT HAND  Final   Special Requests BOTTLES DRAWN AEROBIC AND ANAEROBIC  1CC  Final   Culture NO GROWTH 4 DAYS  Final   Report Status PENDING  Incomplete  Blood culture (routine x 2)     Status: None (Preliminary result)   Collection Time: 05/22/15  8:44 AM  Result Value Ref Range Status   Specimen Description BLOOD LEFT WRIST  Final   Special Requests BOTTLES DRAWN AEROBIC AND ANAEROBIC  1CC  Final   Culture NO GROWTH 4 DAYS  Final   Report Status PENDING  Incomplete   Urine culture     Status: Abnormal   Collection Time: 05/22/15 11:31 AM  Result Value Ref Range Status   Specimen Description URINE, RANDOM  Final   Special Requests NONE  Final   Culture >=100,000 COLONIES/mL ESCHERICHIA COLI (A)  Final   Report Status 05/24/2015 FINAL  Final   Organism ID, Bacteria ESCHERICHIA COLI (A)  Final      Susceptibility   Escherichia coli - MIC*    AMPICILLIN >=32 RESISTANT Resistant     CEFAZOLIN 8 SENSITIVE Sensitive     CEFTRIAXONE <=1 SENSITIVE Sensitive     CIPROFLOXACIN >=4 RESISTANT Resistant     GENTAMICIN <=1 SENSITIVE Sensitive     IMIPENEM <=0.25 SENSITIVE Sensitive     NITROFURANTOIN <=16 SENSITIVE Sensitive     TRIMETH/SULFA <=20 SENSITIVE Sensitive     AMPICILLIN/SULBACTAM 4 SENSITIVE Sensitive     PIP/TAZO <=4 SENSITIVE Sensitive     Extended ESBL NEGATIVE Sensitive     * >=100,000 COLONIES/mL ESCHERICHIA COLI  MRSA PCR Screening     Status: None   Collection Time: 05/22/15  1:19 PM  Result Value Ref Range Status   MRSA by PCR NEGATIVE NEGATIVE Final    Comment:        The GeneXpert MRSA Assay (FDA approved for NASAL specimens only), is one component of a comprehensive MRSA colonization surveillance program. It is not intended to diagnose MRSA infection nor to guide or monitor treatment for MRSA infections.    Anti-infectives    Start     Dose/Rate Route Frequency Ordered Stop   05/26/15 1200  cefTRIAXone (ROCEPHIN) 1 g in dextrose 5 % 50 mL IVPB     1 g 100 mL/hr over 30 Minutes Intravenous Every 24 hours 05/26/15 1129     05/25/15 1000  doxycycline (VIBRA-TABS) tablet 100 mg  Status:  Discontinued     100 mg Oral Every 12 hours 05/25/15 0928 05/26/15 0811   05/23/15 1800  cefTRIAXone (ROCEPHIN) 1 g in dextrose 5 % 50 mL IVPB  Status:  Discontinued     1 g 100 mL/hr over 30 Minutes Intravenous Every 24 hours 05/23/15 1457 05/25/15 0928   05/23/15 1000  azithromycin (ZITHROMAX) tablet 250 mg  Status:  Discontinued     250  mg Oral Daily 05/22/15 1431 05/23/15 0905   05/22/15 1630  cefTRIAXone (ROCEPHIN) 1 g in dextrose 5 % 50 mL IVPB  Status:  Discontinued     1 g 100 mL/hr over 30 Minutes Intravenous Every 24 hours 05/22/15 1553 05/23/15 1457   05/22/15 1445  azithromycin (ZITHROMAX) tablet 500 mg     500 mg Oral Daily 05/22/15 1431 05/22/15 1606      CXR: NNF  IMPRESSION: 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, resolving Possible PNA Hyponatremia - 3% saline initiated 04/11, stopped 04/13 Hypokalemia - recurrent Protein-calorie malnutrition Adult FTT Agitated delirium with delusions Transient paroxysmal AF with RVR  PLAN/REC: Cont supplemental O2 Cont nebulized steroids and bronchodilators Monitor BMET intermittently Monitor I/Os Correct electrolytes as indicated Monitor temp, WBC count Micro and abx as above Amiodarone initiated 04/13 Continue PRN low dose haloperidol Psychiatry eval requested 04/13 Since she is refusing PO medications, I have stopped most of them and changed phenytoin to IV Palliative Care might be appropriate for goals of care  Transfer to Telemetry 04/13  PCCM will sign off. Please call if we can be of further assistance  Billy Fischeravid Jorian [place], MD PCCM service Mobile [phone] Pager [phone] 05/26/2015

## 2015-05-27 LAB — CBC
HEMATOCRIT: 44.8 % (ref 35.0–47.0)
HEMOGLOBIN: 14.9 g/dL (ref 12.0–16.0)
MCH: 30 pg (ref 26.0–34.0)
MCHC: 33.2 g/dL (ref 32.0–36.0)
MCV: 90.3 fL (ref 80.0–100.0)
Platelets: 183 10*3/uL (ref 150–440)
RBC: 4.96 MIL/uL (ref 3.80–5.20)
RDW: 16.4 % — AB (ref 11.5–14.5)
WBC: 10.9 10*3/uL (ref 3.6–11.0)

## 2015-05-27 LAB — BASIC METABOLIC PANEL
ANION GAP: 11 (ref 5–15)
BUN: 12 mg/dL (ref 6–20)
CALCIUM: 9.1 mg/dL (ref 8.9–10.3)
CO2: 30 mmol/L (ref 22–32)
Chloride: 89 mmol/L — ABNORMAL LOW (ref 101–111)
Creatinine, Ser: 0.45 mg/dL (ref 0.44–1.00)
GFR calc Af Amer: >60 mL/min (ref >60)
GLUCOSE: 128 mg/dL — AB (ref 65–99)
POTASSIUM: 3.4 mmol/L — AB (ref 3.5–5.1)
Sodium: 130 mmol/L — ABNORMAL LOW (ref 135–145)

## 2015-05-27 MED ORDER — POTASSIUM CHLORIDE 10 MEQ/100ML IV SOLN
10.0000 meq | Freq: Once | INTRAVENOUS | Status: AC
  Administered 2015-05-27: 10 meq via INTRAVENOUS
  Filled 2015-05-27: qty 100

## 2015-05-27 MED ORDER — OLANZAPINE 5 MG PO TBDP
5.0000 mg | ORAL_TABLET | Freq: Every day | ORAL | Status: DC
  Administered 2015-05-27 – 2015-06-05 (×10): 5 mg via ORAL
  Filled 2015-05-27 (×14): qty 1

## 2015-05-27 MED ORDER — ENSURE ENLIVE PO LIQD
237.0000 mL | Freq: Two times a day (BID) | ORAL | Status: DC
  Administered 2015-05-28 – 2015-06-07 (×16): 237 mL via ORAL

## 2015-05-27 MED ORDER — DILTIAZEM HCL 25 MG/5ML IV SOLN
10.0000 mg | Freq: Four times a day (QID) | INTRAVENOUS | Status: DC
  Administered 2015-05-27 – 2015-05-29 (×8): 10 mg via INTRAVENOUS
  Filled 2015-05-27 (×8): qty 5

## 2015-05-27 NOTE — Progress Notes (Signed)
Nutrition Follow-up    INTERVENTION:   -Ensure Enlive po BID between meals ordered by MD today, each supplement provides 350 kcal and 20 grams of protein, recommend continuing at present. Pt has not tried yet. Recommend addition of Magic Cup BID on meal trays as well -Recommend liberalizing diet to Regular to promote po intake -Recommend addition of bowel regimen as pt without BM since admission per documentation  NUTRITION DIAGNOSIS:   Inadequate oral intake related to poor appetite as evidenced by meal completion < 50%.  GOAL:   Patient will meet greater than or equal to 90% of their needs   MONITOR:   PO intake, Labs, Weight trends  REASON FOR ASSESSMENT:   Consult Poor PO  ASSESSMENT:    Pt remains confused, acute delerium,  PO medications have been changed to IV, pt has been refusing care at times; hyponatremia improving, sodium 130.    Diet Order:  Carb Modified  Energy Intake: per Tacey RuizLeah RN, pt ate a few bites of oatmeal with family assistance this AM, has not eaten anything else thus far today. Recorded po intake 27% on average (nothing recorded since 05/25/15)  Skin:  Reviewed, no issues  Last BM:  no BM documented   Labs: sodium 130, potassium 3.4  Meds: reviewed  Height:   Ht Readings from Last 1 Encounters:  05/22/15 5\' 4"  (1.626 m)    Weight:   Wt Readings from Last 1 Encounters:  05/26/15 158 lb (71.668 kg)    Filed Weights   05/22/15 0810 05/22/15 1321 05/26/15 1428  Weight: 166 lb (75.297 kg) 180 lb 12.4 oz (82 kg) 158 lb (71.668 kg)    BMI:  Body mass index is 27.11 kg/(m^2).  Estimated Nutritional Needs:   Kcal:  1700-2050 kcals   Protein:  65-76 g   Fluid:  >1.5 L  EDUCATION NEEDS:   No education needs identified at this time  Romelle StarcherCate Kash [place] MS, RD, LDN [phone] Pager  [phone] Weekend/On-Call Pager

## 2015-05-27 NOTE — Care Management (Signed)
Patient remains very confused and agitated.

## 2015-05-27 NOTE — Progress Notes (Signed)
Amiodarone drip discontinued this AM around 930. Given 10mg  of IV cardizem. BP is stable and patient's heart rate has maintained in the 80's, NSR. Family was at bedside for about an hour and patient responded much better. Patient allowed family to bathe her and feed her a few bites of oatmeal. Before family arrived patient was noncompliant and kept stating "I need to get out of this crazy room" thinking she was at home. Patient has safety mitts on due to patient attempting to pull her foley catheter out. Family is now gone, will keep a close eye on patient for safety.

## 2015-05-27 NOTE — Progress Notes (Signed)
[facility]  ROUNDING NOTE   Subjective:  Na upto 130 today Overall doing better Trying to increase oral intake Family (daughter and granddaughter) at bedside who stated that normally she is very sharp of mind and able to live independently   Objective:  Vital signs in last 24 hours:  Temp:  [97.7 F (36.5 C)-98.2 F (36.8 C)] 97.7 F (36.5 C) (04/14 1118) Pulse Rate:  [79-105] 79 (04/14 1118) Resp:  [16-20] 20 (04/14 1118) BP: (123-158)/(64-110) 151/70 mmHg (04/14 1118) SpO2:  [93 %-97 %] 95 % (04/14 1118) Weight:  [71.668 kg (158 lb)] 71.668 kg (158 lb) (04/13 1428)  Weight change:  Filed Weights   05/22/15 0810 05/22/15 1321 05/26/15 1428  Weight: 75.297 kg (166 lb) 82 kg (180 lb 12.4 oz) 71.668 kg (158 lb)    Intake/Output: I/O last 3 completed shifts: In: 940.3 [I.V.:690.3; IV Piggyback:250] Out: 2825 [Urine:2825]   Intake/Output this shift:  Total I/O In: 3 [I.V.:3] Out: -   Physical Exam: General: No acute distress  Head: Normocephalic, atraumatic. Dry oral mucosal membranes  Eyes: Anicteric  Neck: Supple, trachea midline  Lungs:  Rales on the right  Heart: S1S2 no rubs, irregular tachycardic  Abdomen:  Soft, nontender, BS present   Extremities: no peripheral edema.  Neurologic: Nonfocal, moving all four extremities, Able to follow simple commands   Skin: No lesions  Psych mumbles    Basic Metabolic Panel:  Recent Labs Lab 05/22/15 0821  05/23/15 0442 05/24/15 0401  05/25/15 0421  05/25/15 1849 05/25/15 2310 05/26/15 0301 05/26/15 0554 05/27/15 0611  NA 114*  < > 120* 117*  < > 122*  < > 125* 126* 127* 128* 130*  K 2.3*  < > 3.9 4.1  --  4.1  --   --   --   --  3.3* 3.4*  CL <65*  < > 78* 76*  --  81*  --   --   --   --  89* 89*  CO2 37*  < > 33* 31  --  30  --   --   --   --  31 30  GLUCOSE 165*  < > 98 156*  --  130*  --   --   --   --  138* 128*  BUN 6  < > 7 8  --  10  --   --   --   --  13 12  CREATININE 0.58  < > 0.45  0.47  --  0.53  --   --   --   --  0.54 0.45  CALCIUM 9.0  < > 8.1* 8.1*  --  8.5*  --   --   --   --  8.9 9.1  MG 1.4*  --  2.4  --   --   --   --   --   --   --   --   --   < > = values in this interval not displayed.  Liver Function Tests:  Recent Labs Lab 05/22/15 0821  AST 48*  ALT 18  ALKPHOS 165*  BILITOT 1.0  PROT 8.0  ALBUMIN 4.3   No results for input(s): LIPASE, AMYLASE in the last 168 hours. No results for input(s): AMMONIA in the last 168 hours.  CBC:  Recent Labs Lab 05/22/15 0821 05/23/15 0442 05/24/15 0401 05/27/15 0611  WBC 9.3 12.5* 9.3 10.9  HGB 17.0* 14.6 14.8 14.9  HCT 50.1* 43.3 43.4  44.8  MCV 86.6 88.5 88.1 90.3  PLT 189 175 164 183    Cardiac Enzymes:  Recent Labs Lab 05/22/15 0821  CKTOTAL 772*  TROPONINI <0.03    BNP: Invalid input(s): POCBNP  CBG:  Recent Labs Lab 05/22/15 1320  GLUCAP 174*    Microbiology: Results for orders placed or performed during the hospital encounter of 05/22/15  Rapid Influenza A&B Antigens ([facility] only)     Status: None   Collection Time: 05/22/15  8:44 AM  Result Value Ref Range Status   Influenza A ([facility]) NEGATIVE NEGATIVE Final   Influenza B ([facility]) NEGATIVE NEGATIVE Final  Blood culture (routine x 2)     Status: None (Preliminary result)   Collection Time: 05/22/15  8:44 AM  Result Value Ref Range Status   Specimen Description BLOOD LEFT HAND  Final   Special Requests BOTTLES DRAWN AEROBIC AND ANAEROBIC  1CC  Final   Culture NO GROWTH 4 DAYS  Final   Report Status PENDING  Incomplete  Blood culture (routine x 2)     Status: None (Preliminary result)   Collection Time: 05/22/15  8:44 AM  Result Value Ref Range Status   Specimen Description BLOOD LEFT WRIST  Final   Special Requests BOTTLES DRAWN AEROBIC AND ANAEROBIC  1CC  Final   Culture NO GROWTH 4 DAYS  Final   Report Status PENDING  Incomplete  Urine culture     Status: Abnormal   Collection Time: 05/22/15 11:31 AM  Result Value Ref  Range Status   Specimen Description URINE, RANDOM  Final   Special Requests NONE  Final   Culture >=100,000 COLONIES/mL ESCHERICHIA COLI (A)  Final   Report Status 05/24/2015 FINAL  Final   Organism ID, Bacteria ESCHERICHIA COLI (A)  Final      Susceptibility   Escherichia coli - MIC*    AMPICILLIN >=32 RESISTANT Resistant     CEFAZOLIN 8 SENSITIVE Sensitive     CEFTRIAXONE <=1 SENSITIVE Sensitive     CIPROFLOXACIN >=4 RESISTANT Resistant     GENTAMICIN <=1 SENSITIVE Sensitive     IMIPENEM <=0.25 SENSITIVE Sensitive     NITROFURANTOIN <=16 SENSITIVE Sensitive     TRIMETH/SULFA <=20 SENSITIVE Sensitive     AMPICILLIN/SULBACTAM 4 SENSITIVE Sensitive     PIP/TAZO <=4 SENSITIVE Sensitive     Extended ESBL NEGATIVE Sensitive     * >=100,000 COLONIES/mL ESCHERICHIA COLI  MRSA PCR Screening     Status: None   Collection Time: 05/22/15  1:19 PM  Result Value Ref Range Status   MRSA by PCR NEGATIVE NEGATIVE Final    Comment:        The GeneXpert MRSA Assay (FDA approved for NASAL specimens only), is one component of a comprehensive MRSA colonization surveillance program. It is not intended to diagnose MRSA infection nor to guide or monitor treatment for MRSA infections.     Coagulation Studies: No results for input(s): LABPROT, INR in the last 72 hours.  Urinalysis: No results for input(s): COLORURINE, LABSPEC, PHURINE, GLUCOSEU, HGBUR, BILIRUBINUR, KETONESUR, PROTEINUR, UROBILINOGEN, NITRITE, LEUKOCYTESUR in the last 72 hours.  Invalid input(s): APPERANCEUR    Imaging: Dg Chest Port 1 View  05/26/2015  CLINICAL DATA:  Respiratory failure. EXAM: PORTABLE CHEST 1 VIEW COMPARISON:  May 24, 2015. FINDINGS: Stable cardiomediastinal silhouette. Atherosclerosis of thoracic aorta is noted. No pneumothorax or significant pleural effusion is noted. Increased bibasilar opacities are noted concerning for atelectasis or possibly pneumonia. Bony thorax is unremarkable. IMPRESSION:  Increased bibasilar  opacities are noted concerning for worsening atelectasis or possibly pneumonia. Electronically Signed   By: Lupita RaiderJames  Green Jr, M.D.   On: 05/26/2015 07:44     Medications:     . antiseptic oral rinse  7 mL Mouth Rinse BID  . budesonide (PULMICORT) nebulizer solution  0.25 mg Nebulization BID  . cefTRIAXone (ROCEPHIN)  IV  1 g Intravenous Q24H  . diltiazem  10 mg Intravenous Q6H  . enoxaparin (LOVENOX) injection  40 mg Subcutaneous Q24H  . ipratropium-albuterol  3 mL Nebulization Q6H  . LORazepam  1 mg Intravenous Q6H  . phenytoin (DILANTIN) IV  100 mg Intravenous 3 times per day  . potassium chloride  10 mEq Intravenous Once  . sodium chloride flush  3 mL Intravenous Q12H   acetaminophen **OR** [DISCONTINUED] acetaminophen, albuterol, haloperidol lactate, hydrALAZINE, metoprolol, morphine injection, ondansetron **OR** ondansetron (ZOFRAN) IV, sodium chloride flush  Assessment/ Plan:  75 y.o. female with past medical history of COPD, hypertension, seizure disorder, peripheral neuropathy, anxiety, depression, history of psychogenic polydipsia who presented to [facility] with mechanical [facility]. Patient known to us from prior admissions as we saw her for hyponatremia in 2014.   1.  Hyponatremia, with prior hx of psychogenic polydipsia.  2.  Acute respiratory failure. 3.  Hypokalemia. 4.  Hypomagnesemia.  5.  UTI, E Coli  Plan:  It is possible that UTI caused dehydration and led to hyponatremia Sodium level has improved to 130 Continue supportive care  We'll sign off. Please call if there are further questions.    LOS: 5 Dewey Neukam 4/14/20171:30 PM

## 2015-05-27 NOTE — Consult Note (Signed)
[facility] Face-to-Face Psychiatry Consult   Reason for Consult:  Consult for this 75 year old woman with a history of past recurrent psychosis current delirium. Confusion and agitation. Referring Physician:  Posey Pronto Patient Identification: Candace Woods MRN:  [id number] Principal Diagnosis: Acute delirium Diagnosis:   Patient Active Problem List   Diagnosis Date Noted  . Acute delirium [R41.0] 05/26/2015  . 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 Seizures ([place]) [R56.9] 05/26/2015  . Psychosis [F29] 05/26/2015  . Acute on chronic respiratory failure with hypoxia (HCC) [J96.21] 05/22/2015    Total Time spent with patient: 20 minutes  Subjective:   Candace Woods is a 75 y.o. female patient admitted with patient was not able to offer any subjective information.  Follow-up Friday the 14th. Patient was markedly different today. She was awake and actually alert. She was not engaging in the repetitive stereotyped behavior that she was doing yesterday. Unlike yesterday she was able to respond to direct questions with yes or no answers and was able to say her name. She continues to feel or at least didn't appear to be confused and was not able to give any other history.she was pulling at her mittens but not very aggressively and didn't look like she was getting agitated otherwise.  HPI:  Patient was interviewed such as I could, exam completed, some neurologic exam, reviewed chart including review of old notes including times that I have seen this patient years ago. Current labs reviewed. 75 year old woman who has a complex past psychiatric and medical history who is in the hospital with multiple problems including hyponatremia. She was in the critical care unit for a while and reportedly was verbal at that time and appeared to be psychotic or delirious ordering people around acting hyper religious. Today she has been transferred to the regular medical floor. When I came to see the patient she appeared  to be awake with her eyes open but did not respond verbally to me at all. She was making repetitive complex gestures. One arm was rubbing at the side of the bed while the other was rubbing on her stomach. She was twisted over to one side. She appeared to be able to move her eyes occasionally with some purpose but was not able to vocalize whatsoever and was not able to stop or change the behaviors that she was doing. Nursing reports that she's been acting like this or in similar ways for most of the day. Earlier today they may have heard some mumbling but have not had any coherent speech out of her. Patient came in with sodium down in the teens, she has gradually come up to the mid 20s. She has been given some when necessary medicines for delirium but doesn't have standing psychiatric medicines that she is given. She came in with a diagnosis pre-stated of seizures. She was prescribed her Dilantin as an oral. Not clear if she's been able to get any today.  Social history: Patient was married last time I was aware of it. I didn't locate any family today. Not clear to me at this time where she lives or what her outpatient situation is.  Medical history: Multiple medical problems. Patient has had recurrent episodes of presentation with hyponatremia which is probably the result of psychogenic polydipsia. It's not clear to me whether the seizures have been only related to hyponatremia or have occurred in other contexts. Patient also has a urinary tract infection recently but that seems to of been treated.  Substance abuse history:  No recent description of any substance abuse or clear past substance abuse issues.  Past Psychiatric History: Patient has a history of complex psychiatric symptoms. I remember seeing her before years ago and I looked back over my notes. She had a history of multiple presentations to the hospital for psychogenic polydipsia and would often present as psychotic. During psychotic spell she  would sometimes get paranoid and agitated. It did not appear that she was routinely following up with any outpatient psychiatric treatment. Last time I saw her in the hospital we had started a tiny dose of clozapine in probably going to be discontinued at discharge. Not clear if she's been getting any mental health treatment since then. No known history of suicide attempts. Doesn't appear that she's had psychiatric hospitalization  Risk to Self: Is patient at risk for suicide?: No Risk to Others:   Prior Inpatient Therapy:   Prior Outpatient Therapy:    Past Medical History:  Past Medical History  Diagnosis Date  . COPD (chronic obstructive pulmonary disease) ([place])   . Hypertension   . Seizures ([place])   . Peripheral neuropathy ([place])   . Torticollis Right  . Anxiety   . Depression   . Psychogenic polydipsia   . MRSA pneumonia ([place])     2014  . Hyperlipemia   . Asthma     Past Surgical History  Procedure Laterality Date  . Abdominal hysterectomy     Family History:  Family History  Problem Relation Age of Onset  . Hypertension Other    Family Psychiatric  History: Patient was not able to offer any history about that right now I don't believe that there is been any known family history. Social History:  History  Alcohol Use No     History  Drug Use No    Social History   Social History  . Marital Status: Widowed    Spouse Name: N/A  . Number of Children: N/A  . Years of Education: N/A   Social History Main Topics  . Smoking status: Current Every Day Smoker -- 2.00 packs/day for 55 years    Types: Cigarettes  . Smokeless tobacco: None  . Alcohol Use: No  . Drug Use: No  . Sexual Activity: Not Asked   Other Topics Concern  . None   Social History Narrative   Additional Social History:    Allergies:   Allergies  Allergen Reactions  . Aspirin   . Codeine   . Contrast Media [Iodinated Diagnostic Agents] Hives    Hives with contrast media injections  .  Penicillins   . Sulfa Antibiotics     Labs:  Results for orders placed or performed during the hospital encounter of 05/22/15 (from the past 48 hour(s))  Sodium     Status: Abnormal   Collection Time: 05/25/15  3:00 PM  Result Value Ref Range   Sodium 122 (L) 135 - 145 mmol/L  Sodium     Status: Abnormal   Collection Time: 05/25/15  6:49 PM  Result Value Ref Range   Sodium 125 (L) 135 - 145 mmol/L  Sodium     Status: Abnormal   Collection Time: 05/25/15 11:10 PM  Result Value Ref Range   Sodium 126 (L) 135 - 145 mmol/L  Sodium     Status: Abnormal   Collection Time: 05/26/15  3:01 AM  Result Value Ref Range   Sodium 127 (L) 135 - 145 mmol/L  Basic metabolic panel     Status: Abnormal  Collection Time: 05/26/15  5:54 AM  Result Value Ref Range   Sodium 128 (L) 135 - 145 mmol/L   Potassium 3.3 (L) 3.5 - 5.1 mmol/L   Chloride 89 (L) 101 - 111 mmol/L   CO2 31 22 - 32 mmol/L   Glucose, Bld 138 (H) 65 - 99 mg/dL   BUN 13 6 - 20 mg/dL   Creatinine, Ser 0.54 0.44 - 1.00 mg/dL   Calcium 8.9 8.9 - 10.3 mg/dL   GFR calc non Af Amer >60 >60 mL/min   GFR calc Af Amer >60 >60 mL/min    Comment: (NOTE) The eGFR has been calculated using the CKD EPI equation. This calculation has not been validated in all clinical situations. eGFR's persistently <60 mL/min signify possible Chronic Kidney Disease.    Anion gap 8 5 - 15  Basic metabolic panel     Status: Abnormal   Collection Time: 05/27/15  6:11 AM  Result Value Ref Range   Sodium 130 (L) 135 - 145 mmol/L   Potassium 3.4 (L) 3.5 - 5.1 mmol/L   Chloride 89 (L) 101 - 111 mmol/L   CO2 30 22 - 32 mmol/L   Glucose, Bld 128 (H) 65 - 99 mg/dL   BUN 12 6 - 20 mg/dL   Creatinine, Ser 0.45 0.44 - 1.00 mg/dL   Calcium 9.1 8.9 - 10.3 mg/dL   GFR calc non Af Amer >60 >60 mL/min   GFR calc Af Amer >60 >60 mL/min    Comment: (NOTE) The eGFR has been calculated using the CKD EPI equation. This calculation has not been validated in all  clinical situations. eGFR's persistently <60 mL/min signify possible Chronic Kidney Disease.    Anion gap 11 5 - 15  CBC     Status: Abnormal   Collection Time: 05/27/15  6:11 AM  Result Value Ref Range   WBC 10.9 3.6 - 11.0 K/uL   RBC 4.96 3.80 - 5.20 MIL/uL   Hemoglobin 14.9 12.0 - 16.0 g/dL   HCT 44.8 35.0 - 47.0 %   MCV 90.3 80.0 - 100.0 fL   MCH 30.0 26.0 - 34.0 pg   MCHC 33.2 32.0 - 36.0 g/dL   RDW 16.4 (H) 11.5 - 14.5 %   Platelets 183 150 - 440 K/uL    Current Facility-Administered Medications  Medication Dose Route Frequency Provider Last Rate Last Dose  . acetaminophen (TYLENOL) tablet 650 mg  650 mg Oral Q6H PRN Lytle Butte, MD   650 mg at 05/24/15 2145  . albuterol (PROVENTIL) (2.5 MG/3ML) 0.083% nebulizer solution 2.5 mg  2.5 mg Nebulization Q3H PRN Wilhelmina Mcardle, MD      . antiseptic oral rinse (CPC / CETYLPYRIDINIUM CHLORIDE 0.05%) solution 7 mL  7 mL Mouth Rinse BID Wilhelmina Mcardle, MD   7 mL at 05/26/15 2142  . budesonide (PULMICORT) nebulizer solution 0.25 mg  0.25 mg Nebulization BID Wilhelmina Mcardle, MD   0.25 mg at 05/27/15 0839  . cefTRIAXone (ROCEPHIN) 1 g in dextrose 5 % 50 mL IVPB  1 g Intravenous Q24H Dustin Flock, MD   1 g at 05/27/15 1146  . diltiazem (CARDIZEM) injection 10 mg  10 mg Intravenous Q6H Dustin Flock, MD   10 mg at 05/27/15 0949  . enoxaparin (LOVENOX) injection 40 mg  40 mg Subcutaneous Q24H Wilhelmina Mcardle, MD   40 mg at 05/27/15 0949  . feeding supplement (ENSURE ENLIVE) (ENSURE ENLIVE) liquid 237 mL  237 mL Oral  BID BM Harmeet Singh, MD   237 mL at 05/27/15 1400  . haloperidol lactate (HALDOL) injection 1-2 mg  1-2 mg Intravenous Q3H PRN Wilhelmina Mcardle, MD      . hydrALAZINE (APRESOLINE) injection 10-40 mg  10-40 mg Intravenous Q4H PRN Wilhelmina Mcardle, MD   40 mg at 05/26/15 1216  . ipratropium-albuterol (DUONEB) 0.5-2.5 (3) MG/3ML nebulizer solution 3 mL  3 mL Nebulization Q6H Wilhelmina Mcardle, MD   3 mL at 05/27/15 1404  .  LORazepam (ATIVAN) injection 1 mg  1 mg Intravenous Q6H Gonzella Lex, MD   1 mg at 05/27/15 1147  . metoprolol (LOPRESSOR) injection 2.5-5 mg  2.5-5 mg Intravenous Q3H PRN Wilhelmina Mcardle, MD   5 mg at 05/26/15 8341  . morphine 2 MG/ML injection 2 mg  2 mg Intravenous Q4H PRN Lytle Butte, MD   2 mg at 05/26/15 0004  . OLANZapine zydis (ZYPREXA) disintegrating tablet 5 mg  5 mg Oral QHS Gonzella Lex, MD      . ondansetron (ZOFRAN) tablet 4 mg  4 mg Oral Q6H PRN Lytle Butte, MD       Or  . ondansetron [facility]) injection 4 mg  4 mg Intravenous Q6H PRN Lytle Butte, MD      . phenytoin (DILANTIN) injection 100 mg  100 mg Intravenous 3 times per day Wilhelmina Mcardle, MD   100 mg at 05/27/15 1413  . potassium chloride 10 mEq in 100 mL IVPB  10 mEq Intravenous Once Dustin Flock, MD   10 mEq at 05/27/15 1413  . sodium chloride flush (NS) 0.9 % injection 10-40 mL  10-40 mL Intracatheter PRN Dustin Flock, MD      . sodium chloride flush (NS) 0.9 % injection 3 mL  3 mL Intravenous Q12H Lytle Butte, MD   3 mL at 05/27/15 9622    Musculoskeletal: Strength & Muscle Tone: within normal limits Gait & Station: unable to stand Patient leans: N/A  Psychiatric Specialty Exam: Review of Systems  Unable to perform ROS: medical condition    Blood pressure 151/70, pulse 79, temperature 97.7 F (36.5 C), temperature source Oral, resp. rate 20, height 5' 4"  (1.626 m), weight 71.668 kg (158 lb), SpO2 95 %.Body mass index is 27.11 kg/(m^2).  General Appearance: Disheveled  Eye Sport and exercise psychologist::  Fair  Speech:  Negative, Garbled and Slow  Volume:  Decreased  Mood:  Negative  Affect:  Flat  Thought Process:  NA  Orientation:  NA  Thought Content:  WDL  Suicidal Thoughts:  No  Homicidal Thoughts:  No  Memory:  NA  Judgement:  NA  Insight:  NA  Psychomotor Activity:  Decreased  Concentration:  Poor  Recall:  Poor  Fund of Knowledge:Poor  Language: Poor  Akathisia:  No  Handed:  Right  AIMS (if  indicated):     Assets:  Housing Social Support  ADL's:  Impaired  Cognition: Impaired,  Severe  Sleep:      Treatment Plan Summary: Daily contact with patient to assess and evaluate symptoms and progress in treatment, Medication management and Plan On reevaluation today the patient is different than she was yesterday. I'm still not sure what was going on yesterday if that was seizure activity or stereo typical movements in catatonia. Dilantin level appears to of not coming back from the lab but fortunately she is getting her Dilantin intravenous. She was given the Ativan and has been getting that IV  as I ordered it as well and doesn't appear to be oversedated. At this point I recommend that we continue the Ativan on a standing basis to treat both agitation and delirium and catatonia and I'm also going to add Zyprexa 5 mg at night for her history of psychosis. This is a low dose but at least we should avoid bad side effects with it. The oral dissolving tablet will make it easier to administer. I will ask for psychiatry on call to follow up over the weekend.  Disposition: Patient does not meet criteria for psychiatric inpatient admission.  Alethia Berthold, MD 05/27/2015 2:17 PM

## 2015-05-27 NOTE — Progress Notes (Signed)
Patient has remained only oriented to self today. Has slept multiple times, but when she does wake up, she is impulsive and attempts to take her mitts off and pull her foley catheter out. Removed mitts periodically today and stayed with patient to give her some relief. Patient did eat some of her lunch this afternoon when fed by staff and she also had a whole container of vanilla ice cream this evening. RN attempted to feed patient dinner and patient stated she likes cooked broccoli, but she spit it out as soon as she chewed it. IV ativan given twice today, patient seems to get agitated around the time medication is scheduled. No complaints of pain. Remained in NSR in the 80's on tele.

## 2015-05-27 NOTE — Progress Notes (Signed)
[facility] - [place] at [facility]                                                                                                                                                                                            Patient Demographics   Candace Woods, is a 75 y.o. female, DOB - March 17, 1940, NWG:[id number]  Admit date - 05/22/2015   Admitting Physician Wyatt Haste, MD  Outpatient Primary MD for the patient is Bobbye Morton, MD   LOS - 5  Subjective:  Intended to be confused respiratory status stable  Review of Systems:   CONSTITUTIONAL: Confused and agitated  Vitals:   Filed Vitals:   05/27/15 0604 05/27/15 0839 05/27/15 0934 05/27/15 1118  BP: 158/75  143/65 151/70  Pulse: 86  85 79  Temp:    97.7 F (36.5 C)  TempSrc:    Oral  Resp:    20  Height:      Weight:      SpO2:  94%  95%    Wt Readings from Last 3 Encounters:  05/26/15 71.668 kg (158 lb)     Intake/Output Summary (Last 24 hours) at 05/27/15 1312 Last data filed at 05/27/15 0949  Gross per 24 hour  Intake    315 ml  Output   1025 ml  Net   -710 ml    Physical Exam:   GENERAL: Agitated HEAD, EYES, EARS, NOSE AND THROAT: Atraumatic, normocephalic. Extraocular muscles are intact. Pupils equal and reactive to light. Sclerae anicteric. No conjunctival injection. No oro-pharyngeal erythema.  NECK: Supple. There is no jugular venous distention. No bruits, no lymphadenopathy, no thyromegaly.  HEART: Tachycardic. No murmurs, no rubs, no clicks.  LUNGS: Diminished breath sounds bilaterally with some associated muscle usage.  ABDOMEN: Soft, flat, nontender, nondistended. Has good bowel sounds. No hepatosplenomegaly appreciated.  EXTREMITIES: No evidence of any cyanosis, clubbing, or peripheral edema.  +2 pedal and radial pulses bilaterally.  NEUROLOGIC:  Confused SKIN: Moist and warm with no rashes appreciated.  Psych: Confused LN: No inguinal LN enlargement    Antibiotics    Anti-infectives    Start     Dose/Rate Route Frequency Ordered Stop   05/26/15 1200  cefTRIAXone (ROCEPHIN) 1 g in dextrose 5 % 50 mL IVPB     1 g 100 mL/hr over 30 Minutes Intravenous Every 24 hours 05/26/15 1129     05/25/15 1000  doxycycline (VIBRA-TABS) tablet 100 mg  Status:  Discontinued     100 mg Oral Every 12 hours 05/25/15 0928 05/26/15 0811   05/23/15 1800  cefTRIAXone (ROCEPHIN) 1 g in  dextrose 5 % 50 mL IVPB  Status:  Discontinued     1 g 100 mL/hr over 30 Minutes Intravenous Every 24 hours 05/23/15 1457 05/25/15 0928   05/23/15 1000  azithromycin (ZITHROMAX) tablet 250 mg  Status:  Discontinued     250 mg Oral Daily 05/22/15 1431 05/23/15 0905   05/22/15 1630  cefTRIAXone (ROCEPHIN) 1 g in dextrose 5 % 50 mL IVPB  Status:  Discontinued     1 g 100 mL/hr over 30 Minutes Intravenous Every 24 hours 05/22/15 1553 05/23/15 1457   05/22/15 1445  azithromycin (ZITHROMAX) tablet 500 mg     500 mg Oral Daily 05/22/15 1431 05/22/15 1606      Medications   Scheduled Meds: . antiseptic oral rinse  7 mL Mouth Rinse BID  . budesonide (PULMICORT) nebulizer solution  0.25 mg Nebulization BID  . cefTRIAXone (ROCEPHIN)  IV  1 g Intravenous Q24H  . diltiazem  10 mg Intravenous Q6H  . enoxaparin (LOVENOX) injection  40 mg Subcutaneous Q24H  . ipratropium-albuterol  3 mL Nebulization Q6H  . LORazepam  1 mg Intravenous Q6H  . phenytoin (DILANTIN) IV  100 mg Intravenous 3 times per day  . sodium chloride flush  3 mL Intravenous Q12H   Continuous Infusions:   PRN Meds:.acetaminophen **OR** [DISCONTINUED] acetaminophen, albuterol, haloperidol lactate, hydrALAZINE, metoprolol, morphine injection, ondansetron **OR** ondansetron (ZOFRAN) IV, sodium chloride flush   Data Review:   Micro Results Recent Results (from the past 240 hour(s))  Rapid Influenza A&B Antigens ([facility] only)     Status: None   Collection Time: 05/22/15  8:44 AM  Result Value Ref Range Status   Influenza A ([facility])  NEGATIVE NEGATIVE Final   Influenza B ([facility]) NEGATIVE NEGATIVE Final  Blood culture (routine x 2)     Status: None (Preliminary result)   Collection Time: 05/22/15  8:44 AM  Result Value Ref Range Status   Specimen Description BLOOD LEFT HAND  Final   Special Requests BOTTLES DRAWN AEROBIC AND ANAEROBIC  1CC  Final   Culture NO GROWTH 4 DAYS  Final   Report Status PENDING  Incomplete  Blood culture (routine x 2)     Status: None (Preliminary result)   Collection Time: 05/22/15  8:44 AM  Result Value Ref Range Status   Specimen Description BLOOD LEFT WRIST  Final   Special Requests BOTTLES DRAWN AEROBIC AND ANAEROBIC  1CC  Final   Culture NO GROWTH 4 DAYS  Final   Report Status PENDING  Incomplete  Urine culture     Status: Abnormal   Collection Time: 05/22/15 11:31 AM  Result Value Ref Range Status   Specimen Description URINE, RANDOM  Final   Special Requests NONE  Final   Culture >=100,000 COLONIES/mL ESCHERICHIA COLI (A)  Final   Report Status 05/24/2015 FINAL  Final   Organism ID, Bacteria ESCHERICHIA COLI (A)  Final      Susceptibility   Escherichia coli - MIC*    AMPICILLIN >=32 RESISTANT Resistant     CEFAZOLIN 8 SENSITIVE Sensitive     CEFTRIAXONE <=1 SENSITIVE Sensitive     CIPROFLOXACIN >=4 RESISTANT Resistant     GENTAMICIN <=1 SENSITIVE Sensitive     IMIPENEM <=0.25 SENSITIVE Sensitive     NITROFURANTOIN <=16 SENSITIVE Sensitive     TRIMETH/SULFA <=20 SENSITIVE Sensitive     AMPICILLIN/SULBACTAM 4 SENSITIVE Sensitive     PIP/TAZO <=4 SENSITIVE Sensitive     Extended ESBL NEGATIVE Sensitive     * >=  100,000 COLONIES/mL ESCHERICHIA COLI  MRSA PCR Screening     Status: None   Collection Time: 05/22/15  1:19 PM  Result Value Ref Range Status   MRSA by PCR NEGATIVE NEGATIVE Final    Comment:        The GeneXpert MRSA Assay (FDA approved for NASAL specimens only), is one component of a comprehensive MRSA colonization surveillance program. It is not intended to  diagnose MRSA infection nor to guide or monitor treatment for MRSA infections.     Radiology Reports Ct Head Wo Contrast  05/22/2015  CLINICAL DATA:  Fall last evening.  Hypoxic and confusion.  COPD. EXAM: CT HEAD WITHOUT CONTRAST TECHNIQUE: Contiguous axial images were obtained from the base of the skull through the vertex without contrast. COMPARISON:  12/22/2007 FINDINGS: Subtle low-density in the subcortical white matter. There is focal white matter disease involving the left external capsule and the left white matter tracts. This could represent ischemic changes of unknown age. No evidence for acute hemorrhage, mass lesion, midline shift, hydrocephalus or large new infarct. No calvarial fracture. Visualized paranasal sinuses are clear. IMPRESSION: No acute intracranial abnormality. Age indeterminate white matter disease, particularly on the left side as described. Findings may represent old ischemic changes. Electronically Signed   By: Richarda OverlieAdam  Henn M.D.   On: 05/22/2015 09:23   Ct Angio Chest Pe W/cm &/or Wo Cm  05/22/2015  CLINICAL DATA:  Recent fall with hypoxia and shortness of Breath EXAM: CT ANGIOGRAPHY CHEST WITH CONTRAST TECHNIQUE: Multidetector CT imaging of the chest was performed using the standard protocol during bolus administration of intravenous contrast. Multiplanar CT image reconstructions and MIPs were obtained to evaluate the vascular anatomy. CONTRAST:  75 mL Isovue 370. COMPARISON:  None. FINDINGS: The lungs are well aerated bilaterally. Mild patchy infiltrate is noted in the right middle and right lower lobes. Some atelectatic changes are noted in the lingula. Diffuse emphysematous changes are seen with apical scarring on the right. Some chronic scarring in the right lower lobe is noted as well. The thoracic inlet is within normal limits. The thoracic aorta shows diffuse calcifications without aneurysmal dilatation or dissection. Atherosclerotic plaque is noted in the descending  aorta. The pulmonary artery is well visualized and demonstrates a normal branching pattern. No filling defect is seen to suggest pulmonary embolism. Mild calcified hilar lymph nodes are seen consistent with prior granulomatous disease. No significant lymphadenopathy is seen. The visualized upper abdomen shows calcified splenic granulomas. No other focal abnormality is noted. The osseous structures are within normal limits. Review of the MIP images confirms the above findings. IMPRESSION: No evidence of pulmonary emboli. Diffuse emphysematous changes and changes of prior granulomatous disease. Patchy infiltrate in the right middle and right lower lobes. Electronically Signed   By: Alcide CleverMark  Lukens M.D.   On: 05/22/2015 15:16   [place] Venous Img Lower Unilateral Left  05/22/2015  CLINICAL DATA:  Left lower extremity edema. EXAM: Or the hips LOWER EXTREMITY VENOUS DOPPLER ULTRASOUND TECHNIQUE: Gray-scale sonography with graded compression, as well as color Doppler and duplex ultrasound were performed to evaluate the lower extremity deep venous systems from the level of the common femoral vein and including the common femoral, femoral, profunda femoral, popliteal and calf veins including the posterior tibial, peroneal and gastrocnemius veins when visible. The superficial great saphenous vein was also interrogated. Spectral Doppler was utilized to evaluate flow at rest and with distal augmentation maneuvers in the common femoral, femoral and popliteal veins. COMPARISON:  None. FINDINGS: Contralateral Common  Femoral Vein: Respiratory phasicity is normal and symmetric with the symptomatic side. No evidence of thrombus. Normal compressibility. Common Femoral Vein: No evidence of thrombus. Normal compressibility, respiratory phasicity and response to augmentation. Saphenofemoral Junction: No evidence of thrombus. Normal compressibility and flow on color Doppler imaging. Profunda Femoral Vein: No evidence of thrombus. Normal  compressibility and flow on color Doppler imaging. Femoral Vein: No evidence of thrombus. Normal compressibility, respiratory phasicity and response to augmentation. Popliteal Vein: No evidence of thrombus. Normal compressibility, respiratory phasicity and response to augmentation. Calf Veins: No evidence of thrombus. Normal compressibility and flow on color Doppler imaging. Superficial Great Saphenous Vein: No evidence of thrombus. Normal compressibility and flow on color Doppler imaging. Venous Reflux:  None. Other Findings:  None. IMPRESSION: No evidence of deep venous thrombosis. Electronically Signed   By: Amie Portland M.D.   On: 05/22/2015 15:37   Dg Chest Port 1 View  05/26/2015  CLINICAL DATA:  Respiratory failure. EXAM: PORTABLE CHEST 1 VIEW COMPARISON:  May 24, 2015. FINDINGS: Stable cardiomediastinal silhouette. Atherosclerosis of thoracic aorta is noted. No pneumothorax or significant pleural effusion is noted. Increased bibasilar opacities are noted concerning for atelectasis or possibly pneumonia. Bony thorax is unremarkable. IMPRESSION: Increased bibasilar opacities are noted concerning for worsening atelectasis or possibly pneumonia. Electronically Signed   By: Lupita Raider, M.D.   On: 05/26/2015 07:44   Dg Chest Port 1 View  05/24/2015  CLINICAL DATA:  PICC line placement follow-up study; history of acute on chronic respiratory failure EXAM: PORTABLE CHEST 1 VIEW COMPARISON:  Portable chest x-ray of today's date FINDINGS: The right-sided PICC line tip again extends into the venous system of the lower right neck. The tip is not included in the field of view. The lungs remain hyperinflated. The interstitial markings remain increased especially at the right lung base. There is a small right pleural effusion. The cardiac silhouette remains enlarged. The central pulmonary vascularity is mildly prominent. IMPRESSION: The PICC line tip again extends into the neck on the right and extends beyond  the field of view of the study. Repositioning is indicated. These results will be called to the ordering clinician or representative by the Radiologist Assistant, and communication documented in the PACS or zVision Dashboard. Electronically Signed   By: David  [place] M.D.   On: 05/24/2015 14:29   Dg Chest Port 1 View  05/24/2015  CLINICAL DATA:  75 year old female with PICC line placement. Subsequent encounter. EXAM: PORTABLE CHEST 1 VIEW COMPARISON:  05/24/2015 5:41 a.m. FINDINGS: Right PICC line has been placed and traverses superiorly into the neck. It is possible this curls upon itself although full course of the PICC line is not imaged. This will need to be reposition. Remainder of findings without change since exam earlier today. IMPRESSION: Right PICC line has been placed and traverses superiorly into the neck. It is possible this curls upon itself although full course of the PICC line is not imaged. This will need to be reposition. These results will be called to the ordering clinician or representative by the Radiologist Assistant, and communication documented in the PACS or zVision Dashboard. Electronically Signed   By: Lacy Duverney M.D.   On: 05/24/2015 13:25   Dg Chest Port 1 View  05/24/2015  CLINICAL DATA:  Respiratory failure, acute and chronic; history of COPD -asthma EXAM: PORTABLE CHEST 1 VIEW COMPARISON:  CT scan of the chest of May 22, 2015 FINDINGS: The lungs are well-expanded. The interstitial markings are coarse bilaterally. Increased  interstitial density in the right infrahilar region is present which corresponds to parenchymal abnormalities in the right middle and lower lobes on the earlier chest CT scan. The cardiac silhouette is mildly enlarged. The pulmonary vascularity is not engorged. There is calcification in the wall of the thoracic aorta. IMPRESSION: COPD.  Bibasilar atelectasis or pneumonia greatest on the right. Electronically Signed   By: David  [place] M.D.   On:  05/24/2015 07:26   Dg Chest Portable 1 View  05/22/2015  CLINICAL DATA:  75 year old who had an unwitnessed fall at her home last night. Current smoker with current history of COPD. EXAM: PORTABLE CHEST 1 VIEW COMPARISON:  CT chest 08/11/2013. Chest x-rays 09/11/2012 and earlier. FINDINGS: Cardiac silhouette upper normal in size to slightly enlarged, unchanged. Emphysematous changes throughout both lungs with prominent bronchovascular markings diffusely and mild central peribronchial thickening, unchanged. Mild atelectasis in the lung bases. Lungs otherwise clear. No pneumothorax. No visible pleural effusions. Osseous demineralization. IMPRESSION: COPD/emphysema. Mild bibasilar atelectasis. No acute cardiopulmonary disease otherwise. Electronically Signed   By: Hulan Saas M.D.   On: 05/22/2015 09:34     CBC  Recent Labs Lab 05/22/15 0821 05/23/15 0442 05/24/15 0401 05/27/15 0611  WBC 9.3 12.5* 9.3 10.9  HGB 17.0* 14.6 14.8 14.9  HCT 50.1* 43.3 43.4 44.8  PLT 189 175 164 183  MCV 86.6 88.5 88.1 90.3  MCH 29.4 29.9 30.0 30.0  MCHC 34.0 33.8 34.1 33.2  RDW 15.5* 15.7* 16.0* 16.4*    Chemistries   Recent Labs Lab 05/22/15 0821  05/23/15 0442 05/24/15 0401  05/25/15 0421  05/25/15 1849 05/25/15 2310 05/26/15 0301 05/26/15 0554 05/27/15 0611  NA 114*  < > 120* 117*  < > 122*  < > 125* 126* 127* 128* 130*  K 2.3*  < > 3.9 4.1  --  4.1  --   --   --   --  3.3* 3.4*  CL <65*  < > 78* 76*  --  81*  --   --   --   --  89* 89*  CO2 37*  < > 33* 31  --  30  --   --   --   --  31 30  GLUCOSE 165*  < > 98 156*  --  130*  --   --   --   --  138* 128*  BUN 6  < > 7 8  --  10  --   --   --   --  13 12  CREATININE 0.58  < > 0.45 0.47  --  0.53  --   --   --   --  0.54 0.45  CALCIUM 9.0  < > 8.1* 8.1*  --  8.5*  --   --   --   --  8.9 9.1  MG 1.4*  --  2.4  --   --   --   --   --   --   --   --   --   AST 48*  --   --   --   --   --   --   --   --   --   --   --   ALT 18  --   --   --    --   --   --   --   --   --   --   --   ALKPHOS 165*  --   --   --   --   --   --   --   --   --   --   --  BILITOT 1.0  --   --   --   --   --   --   --   --   --   --   --   < > = values in this interval not displayed. ------------------------------------------------------------------------------------------------------------------ estimated creatinine clearance is 59 mL/min (by C-G formula based on Cr of 0.45). ------------------------------------------------------------------------------------------------------------------ No results for input(s): HGBA1C in the last 72 hours. ------------------------------------------------------------------------------------------------------------------ No results for input(s): CHOL, HDL, LDLCALC, TRIG, CHOLHDL, LDLDIRECT in the last 72 hours. ------------------------------------------------------------------------------------------------------------------ No results for input(s): TSH, T4TOTAL, T3FREE, THYROIDAB in the last 72 hours.  Invalid input(s): FREET3 ------------------------------------------------------------------------------------------------------------------ No results for input(s): VITAMINB12, FOLATE, FERRITIN, TIBC, IRON, RETICCTPCT in the last 72 hours.  Coagulation profile No results for input(s): INR, PROTIME in the last 168 hours.  No results for input(s): DDIMER in the last 72 hours.  Cardiac Enzymes  Recent Labs Lab 05/22/15 0821  TROPONINI <0.03   ------------------------------------------------------------------------------------------------------------------ Invalid input(s): POCBNP    Assessment & Plan   75 year old Caucasian female history of COPD non-option requiring presenting with mechanical fall and hypoxia  1. Acute on chronic respiratory failure with hypoxia:   acute on chronic COPD exasperation as well as right sided pneumonia we'll repeat chest x-ray Continue 4 L of oxygen Appreciate pulmonary  input Has a urinary tract infection some back on ceftriaxone Continue budesonide and albuterol   2. Elevated heart rate Patient was in sinus tachycardia at this point I will discontinue the amiodarone drip and place her on IV Cardizem 6 hours  3. Hyponatremia: Severe  Nephrology following  sodium improved  4. Hypokalemia little low we'll give her low-dose potassium  5. Elevated CK without kidney injury: IV fluid hydration   6. Venous thromboembolism prophylactic: Heparin subcutaneous  7.  Acute ecenpahlopathy- related to hyponatremia and acute respiratory failure  8. Urinary tract infection with Escherichia coli continue ceftriaxone  9. Miscellaneous Lovenox for DVT prophylaxis      Code Status Orders        Start     Ordered   05/22/15 1448  Do not attempt resuscitation (DNR)   Continuous    Question Answer Comment  In the event of cardiac or respiratory ARREST Do not call a "code blue"   In the event of cardiac or respiratory ARREST Do not perform Intubation, CPR, defibrillation or ACLS   In the event of cardiac or respiratory ARREST Use medication by any route, position, wound care, and other measures to relive pain and suffering. May use oxygen, suction and manual treatment of airway obstruction as needed for comfort.      05/22/15 1447    Code Status History    Date Active Date Inactive Code Status Order ID Comments User Context   05/22/2015 11:15 AM 05/22/2015  2:47 PM Full Code [id number]  Wyatt Haste, MD ED           Consults  none   DVT Prophylaxis  Lovenox   Lab Results  Component Value Date   PLT 183 05/27/2015     Time Spent in minutes   Greater than 50% of time spent in care coordination and counseling patient regarding the condition and plan of care.   Auburn Bilberry M.D on 05/27/2015 at 1:12 PM  Between 7am to 6pm - Pager - [phone]  After 6pm go to [email or web address] - password [email or web address] [facility]  [facility] [place] [facility]   Office   [phone]

## 2015-05-27 NOTE — Evaluation (Addendum)
Physical Therapy Evaluation Patient Details Name: Candace Woods MRN: [id number] DOB: 05/28/1940 Today's Date: 05/27/2015   History of Present Illness  Candace Woods is a 75 y.o. female with a known history of COPD non-oxygen requiring presenting the hospital after mechanical fall at home unable to rise off floor secondary to weakness. On arrival to emergency department noted to be hypoxic with O2 saturations in the mid 70s on room air and had improvement of oxygenation requiring nonrebreather however given increased work of breathing placed on BiPAP therapy. O2 saturations remained in the high 80s. She complains of nonproductive cough shortness of breath however, states this is her baseline. Family reports 2 falls in the last 12 months. Pt is still confused by improved from prior days. At baseline she is AOx4. Pt is a member of [facility] where she goes on Tuesdays. She has a HH Aid 7d/wk for 3 hours/day.  Clinical Impression  Pt is AOx2 currently which is improved from prior days. She is wearing self protective mitts upon arrival but does not attempt to pull at lines/leads when removed for therapy. Pt demonstrates fair strength with bed mobility but poor strength and balance with sit to stand transfers. Unstable in standing and unable to perform marching without falling back onto bed. Unable to ambulate at this time. Pt would benefit from SNF placement but family would like for her to go home. Will continue to update discharge plans during admission as cognition improves. Pt will benefit from skilled PT services to address deficits in strength, balance, and mobility in order to return to full function at home.     Follow Up Recommendations SNF;Other (comment) (Family currently refuses)    Equipment Recommendations  None recommended by PT    Recommendations for Other Services       Precautions / Restrictions Precautions Precautions: Fall Restrictions Weight Bearing Restrictions: No       Mobility  Bed Mobility Overal bed mobility: Needs Assistance Bed Mobility: Supine to Sit;Sit to Supine     Supine to sit: Supervision Sit to supine: Supervision   General bed mobility comments: Pt able to come to sitting with use of bed rail and HOB elevated but no assist from therapist. She requires +2 assist to scoot [facility]  Transfers Overall transfer level: Needs assistance Equipment used: Rolling walker (2 wheeled) Transfers: Sit to/from Stand Sit to Stand: Mod assist;+2 physical assistance         General transfer comment: Pt demonstrates weakness and instability with transfers. Heavy cues for safe hand placement and poor command follow. Pt very unsteady instanding with posterior leaning and poor safety awareness. Attempted standing marches but with attempts she falls back onto bed. Unable to ambulate at this time  Ambulation/Gait                Stairs            Wheelchair Mobility    Modified Rankin (Stroke Patients Only)       Balance Overall balance assessment: Needs assistance Sitting-balance support: No upper extremity supported Sitting balance-Leahy Scale: Fair     Standing balance support: Bilateral upper extremity supported Standing balance-Leahy Scale: Poor                               Pertinent Vitals/Pain Pain Assessment: Faces Faces Pain Scale: No hurt Pain Location: No grimacing or groaning during mobility Pain Intervention(s): Monitored during session  Home Living Family/patient expects to be discharged to:: Private residence Living Arrangements: Alone Available Help at Discharge: Family Type of Home: Apartment Home Access: Level entry     Home Layout: One level Home Equipment: Environmental consultantWalker - 4 wheels;Shower seat;Bedside commode;Grab bars - tub/shower;Grab bars - toilet;Hand held shower head (no wc, hospital bed, lift chair)      Prior Function Level of Independence: Needs assistance   Gait / Transfers  Assistance Needed: rollator for ambulation  ADL's / Homemaking Assistance Needed: Assist with washing back but otherwise independent with ADLs, assist for IADLs        Hand Dominance        Extremity/Trunk Assessment   Upper Extremity Assessment: Difficult to assess due to impaired cognition;Generalized weakness           Lower Extremity Assessment: Difficult to assess due to impaired cognition;Generalized weakness         Communication   Communication: No difficulties  Cognition Arousal/Alertness: Lethargic Behavior During Therapy: Restless Overall Cognitive Status: Impaired/Different from baseline Area of Impairment: Orientation Orientation Level: Disoriented to;Place;Situation (Able to provide president, "911" for emergencies)   Memory: Decreased short-term memory              General Comments      Exercises        Assessment/Plan    PT Assessment Patient needs continued PT services  PT Diagnosis Difficulty walking;Abnormality of gait;Generalized weakness;Altered mental status   PT Problem List Decreased strength;Decreased activity tolerance;Decreased balance;Decreased mobility;Decreased cognition;Decreased knowledge of use of DME;Decreased safety awareness;Cardiopulmonary status limiting activity  PT Treatment Interventions Gait training;DME instruction;Functional mobility training;Therapeutic activities;Balance training;Therapeutic exercise;Neuromuscular re-education;Patient/family education   PT Goals (Current goals can be found in the Care Plan section) Acute Rehab PT Goals Patient Stated Goal: Unable to provide. Family would like pt to return home at discharge PT Goal Formulation: With family Time For Goal Achievement: 06/10/15 Potential to Achieve Goals: Fair    Frequency Min 2X/week   Barriers to discharge Decreased caregiver support Lives alone but has good support from family, HH aid, and [facility] program    Co-evaluation                End of Session Equipment Utilized During Treatment: Gait belt;Oxygen;Other (comment) (4 L/min) Activity Tolerance: Patient limited by lethargy;Treatment limited secondary to agitation Patient left: in bed;with call bell/phone within reach;with bed alarm set;with family/visitor present;Other (comment) (MD in room) Nurse Communication: Mobility status         Time: 1030-1050 PT Time Calculation (min) (ACUTE ONLY): 20 min   Charges:   PT Evaluation $PT Eval Low Complexity: 1 Procedure     PT G Codes:       Sharalyn InkJason D Huprich PT, DPT   Huprich,Jason 05/27/2015, 11:41 AM

## 2015-05-27 NOTE — Care Management Important Message (Signed)
Important Message  Patient Details  Name: Candace GripDonna M Ibanez MRN: [id number] Date of Birth: 1940-09-06   Medicare Important Message Given:  Yes    Olegario MessierKathy A Keisy Strickler 05/27/2015, 10:23 AM

## 2015-05-28 LAB — BASIC METABOLIC PANEL
Anion gap: 9 (ref 5–15)
BUN: 9 mg/dL (ref 6–20)
CALCIUM: 8.6 mg/dL — AB (ref 8.9–10.3)
CO2: 32 mmol/L (ref 22–32)
CREATININE: 0.4 mg/dL — AB (ref 0.44–1.00)
Chloride: 85 mmol/L — ABNORMAL LOW (ref 101–111)
Glucose, Bld: 131 mg/dL — ABNORMAL HIGH (ref 65–99)
Potassium: 2.8 mmol/L — CL (ref 3.5–5.1)
SODIUM: 126 mmol/L — AB (ref 135–145)

## 2015-05-28 MED ORDER — POTASSIUM CHLORIDE CRYS ER 20 MEQ PO TBCR
20.0000 meq | EXTENDED_RELEASE_TABLET | Freq: Two times a day (BID) | ORAL | Status: DC
  Administered 2015-05-28 – 2015-06-01 (×9): 20 meq via ORAL
  Filled 2015-05-28 (×9): qty 1

## 2015-05-28 MED ORDER — POTASSIUM CHLORIDE CRYS ER 20 MEQ PO TBCR
40.0000 meq | EXTENDED_RELEASE_TABLET | Freq: Once | ORAL | Status: AC
  Administered 2015-05-28: 40 meq via ORAL
  Filled 2015-05-28: qty 2

## 2015-05-28 NOTE — Progress Notes (Signed)
Pt. Had small incontinent episode with a smear of brown stool. Bladder scanned pt R/T to F/C removal today. Pt scanned 3 times each time was in bladder. Pt. Placed on bedpan in attempts to get her to void. She attempted and stated she was unable to urinate. Will notify prime. Will continue to monitor pt.

## 2015-05-28 NOTE — Progress Notes (Signed)
[employer] - [place] [facility]   PATIENT NAME: Candace Woods    MR#:  [id number]  DATE OF BIRTH:  08-09-1940  SUBJECTIVE:   Pt sitting up in bed eating breakfast.  Much more awake today. Sodium is still low and also noted to be hypokalemic.  REVIEW OF SYSTEMS:    Review of Systems  Unable to perform ROS: mental acuity    Nutrition: Regular Tolerating Diet: Yes Tolerating PT: Await evaluation    DRUG ALLERGIES:   Allergies  Allergen Reactions  . Aspirin   . Codeine   . Contrast Media [Iodinated Diagnostic Agents] Hives    Hives with contrast media injections  . Penicillins   . Sulfa Antibiotics     VITALS:  Blood pressure 149/103, pulse 96, temperature 98.9 F (37.2 C), temperature source Oral, resp. rate 19, height 5\' 4"  (1.626 m), weight 71.668 kg (158 lb), SpO2 92 %.  PHYSICAL EXAMINATION:   Physical Exam  GENERAL:  75 y.o.-year-old patient lying in the bed in no acute distress.  EYES: Pupils equal, round, reactive to light and accommodation. No scleral icterus. Extraocular muscles intact.  HEENT: Head atraumatic, normocephalic. Oropharynx and nasopharynx clear.  NECK:  Supple, no jugular venous distention. No thyroid enlargement, no tenderness.  LUNGS: Normal breath sounds bilaterally, no wheezing, rales, rhonchi. No use of accessory muscles of respiration.  CARDIOVASCULAR: S1, S2 normal. No murmurs, rubs, or gallops.  ABDOMEN: Soft, nontender, nondistended. Bowel sounds present. No organomegaly or mass.  EXTREMITIES: No cyanosis, clubbing or edema b/l.    NEUROLOGIC: Cranial nerves II through XII are intact. No focal Motor or sensory deficits b/l. Globally weak  PSYCHIATRIC: The patient is alert and oriented x 1.  SKIN: No obvious rash, lesion, or ulcer.    LABORATORY PANEL:   CBC  Recent Labs Lab 05/27/15 0611  WBC 10.9  HGB 14.9  HCT 44.8  PLT 183    ------------------------------------------------------------------------------------------------------------------  Chemistries   Recent Labs Lab 05/22/15 0821  05/23/15 0442  05/28/15 0658  NA 114*  < > 120*  < > 126*  K 2.3*  < > 3.9  < > 2.8*  CL <65*  < > 78*  < > 85*  CO2 37*  < > 33*  < > 32  GLUCOSE 165*  < > 98  < > 131*  BUN 6  < > 7  < > 9  CREATININE 0.58  < > 0.45  < > 0.40*  CALCIUM 9.0  < > 8.1*  < > 8.6*  MG 1.4*  --  2.4  --   --   AST 48*  --   --   --   --   ALT 18  --   --   --   --   ALKPHOS 165*  --   --   --   --   BILITOT 1.0  --   --   --   --   < > = values in this interval not displayed. ------------------------------------------------------------------------------------------------------------------  Cardiac Enzymes  Recent Labs Lab 05/22/15 0821  TROPONINI <0.03   ------------------------------------------------------------------------------------------------------------------  RADIOLOGY:  No results found.   ASSESSMENT AND PLAN:   75 year old female with past medical history of COPD, hypertension, history of seizures, peripheral neuropathy, anxiety/depression, psychogenic polydipsia, hyperlipidemia presented to the hospital due to acute on chronic respiratory failure with COPD exacerbation.  1. COPD Exacerbation - much improved and off Bipap now.   - cont. duoneb's , pulmocort nebs.  Wean O2 as tolerated.  - stable and improved since admission.    2. AMS/Encephalopathy - likely due to UTI/Hyponatremia  - Continue IV ceftriaxone for the UTI. Sodium is improving. -Appreciate psychiatric input and continue as needed Zyprexa and Ativan. Mental status is improving.  3. Hyponatremia-patient has history of psychogenic polydipsia. Presently this is not the cause. -As per nephrology suspected dehydration and related to underlying UTI. Patient was on 3% saline and now is off of it. Sodium is improving and will monitor. Sodium today is  126.  4. Urinary tract infection-secondary to Escherichia coli. Continue ceftriaxone.  5. History of seizures-continue Dilantin.  6. Hypokalemia-we'll place on oral potassium supplements and repeat level in the morning.  All the records are reviewed and case discussed with Care Management/Social Workerr. Management plans discussed with the patient, family and they are in agreement.  CODE STATUS: DO NOT RESUSCITATE  DVT Prophylaxis: Lovenox  TOTAL TIME TAKING CARE OF THIS PATIENT: 30 minutes.   POSSIBLE D/C IN 2-3 DAYS, DEPENDING ON CLINICAL CONDITION.   Houston Siren M.D on 05/28/2015 at 1:07 PM  Between 7am to 6pm - Pager - [phone]  After 6pm go to [email or web address] - password [email or web address] [facility]  [facility]  Office  [phone]  CC: Primary care physician; Bobbye Morton, MD

## 2015-05-28 NOTE — Progress Notes (Signed)
Dr. Cherlynn KaiserSainani notified of critical potassium (2.8). Order for oral potassium received and placed. Trudee KusterBrandi R Mansfield

## 2015-05-28 NOTE — Progress Notes (Signed)
Patient has voided small amount (about 10 mL) since foley removal.  Bladder scan showed 82 mL. Also notified by telemetry that patient HR 170's. Patient in bed, asymptomatic. HR is currently 110's.  Dr. Cherlynn KaiserSainani notified of both, wants to continue to monitor. Asked for magnesium order to be placed with AM labs. Orders placed. Trudee KusterBrandi R Mansfield

## 2015-05-28 NOTE — Progress Notes (Signed)
Pt. Continues to wear the mitts to bilateral hands.  Mitts were removed q2h, so pt can stretch her hands and skin inspection done, no broken skin noted. Discoloration  noted to left wrist. Pt. Given sandwich box, she ate 2 bites of [place]key sandwich.  100% of apple sauce and 1/2 of graham crackers. She drank all of her soda. Pt. Is awake and pleasantly confused. Will continue to monitor pt.

## 2015-05-28 NOTE — Progress Notes (Signed)
Pt. Continues with safety mitts in place. No attempts to pull on foley. Pt is easily aroused per verbal stimuli. Pt. Resting comfortable this shift up to this point. Will continue to monitor pt.

## 2015-05-28 NOTE — Progress Notes (Signed)
Dr. Judithann SheenSparks returned page and was updated on pt's inability to urinate and amount in bladder. New order to replace F/C R/T urinary retention.

## 2015-05-28 NOTE — Progress Notes (Signed)
Orders to d/c foley per Dr. Cherlynn KaiserSainani. Foley d/c'd. Trudee KusterBrandi R Mansfield

## 2015-05-29 LAB — BASIC METABOLIC PANEL
ANION GAP: 8 (ref 5–15)
BUN: 10 mg/dL (ref 6–20)
CHLORIDE: 87 mmol/L — AB (ref 101–111)
CO2: 31 mmol/L (ref 22–32)
Calcium: 8.6 mg/dL — ABNORMAL LOW (ref 8.9–10.3)
Creatinine, Ser: 0.47 mg/dL (ref 0.44–1.00)
GFR calc non Af Amer: >60 mL/min (ref >60)
Glucose, Bld: 183 mg/dL — ABNORMAL HIGH (ref 65–99)
Potassium: 4 mmol/L (ref 3.5–5.1)
Sodium: 126 mmol/L — ABNORMAL LOW (ref 135–145)

## 2015-05-29 LAB — MAGNESIUM: Magnesium: 1.4 mg/dL — ABNORMAL LOW (ref 1.7–2.4)

## 2015-05-29 MED ORDER — TAMSULOSIN HCL 0.4 MG PO CAPS
0.4000 mg | ORAL_CAPSULE | Freq: Every day | ORAL | Status: DC
  Administered 2015-05-29 – 2015-06-07 (×10): 0.4 mg via ORAL
  Filled 2015-05-29 (×10): qty 1

## 2015-05-29 MED ORDER — METOPROLOL TARTRATE 25 MG PO TABS
25.0000 mg | ORAL_TABLET | Freq: Two times a day (BID) | ORAL | Status: DC
  Administered 2015-05-29 – 2015-05-31 (×4): 25 mg via ORAL
  Filled 2015-05-29 (×6): qty 1

## 2015-05-29 MED ORDER — MAGNESIUM SULFATE 2 GM/50ML IV SOLN
2.0000 g | Freq: Once | INTRAVENOUS | Status: AC
  Administered 2015-05-29: 2 g via INTRAVENOUS
  Filled 2015-05-29: qty 50

## 2015-05-29 NOTE — Progress Notes (Signed)
[employer] - [place] at [facility]   PATIENT NAME: Candace Woods    MR#:  [id number]  DATE OF BIRTH:  04/07/1940  SUBJECTIVE:   Still noted to be hyponatremic this morning. Magnesium level is also somewhat low. Patient has periods of tachycardia which resolved spontaneously. She is clinically asymptomatic. Still remains confused but not agitated.  REVIEW OF SYSTEMS:    Review of Systems  Unable to perform ROS: mental acuity    Nutrition: Regular Tolerating Diet: Yes Tolerating PT: Await evaluation    DRUG ALLERGIES:   Allergies  Allergen Reactions  . Aspirin   . Codeine   . Contrast Media [Iodinated Diagnostic Agents] Hives    Hives with contrast media injections  . Penicillins   . Sulfa Antibiotics     VITALS:  Blood pressure 112/64, pulse 93, temperature 98.7 F (37.1 C), temperature source Oral, resp. rate 16, height  (1.626 m), weight 71.668 kg (158 lb), SpO2 94 %.  PHYSICAL EXAMINATION:   Physical Exam  GENERAL:  75 y.o.-year-old patient lying in the bed in no acute distress.  EYES: Pupils equal, round, reactive to light and accommodation. No scleral icterus. Extraocular muscles intact.  HEENT: Head atraumatic, normocephalic. Oropharynx and nasopharynx clear.  NECK:  Supple, no jugular venous distention. No thyroid enlargement, no tenderness.  LUNGS: Diffuse end expiratory wheezing, rhonchi bilaterally. No use of accessory muscles, no dullness to percussion.  CARDIOVASCULAR: S1, S2 normal. No murmurs, rubs, or gallops.  ABDOMEN: Soft, nontender, nondistended. Bowel sounds present. No organomegaly or mass.  EXTREMITIES: No cyanosis, clubbing or edema b/l.    NEUROLOGIC: Cranial nerves II through XII are intact. No focal Motor or sensory deficits b/l. Globally weak  PSYCHIATRIC: The patient is alert and oriented x 1.  SKIN: No obvious rash, lesion, or ulcer.    LABORATORY PANEL:   CBC  Recent Labs Lab 05/27/15 0611  WBC 10.9  HGB  14.9  HCT 44.8  PLT 183   ------------------------------------------------------------------------------------------------------------------  Chemistries   Recent Labs Lab 05/29/15 0809  NA 126*  K 4.0  CL 87*  CO2 31  GLUCOSE 183*  BUN 10  CREATININE 0.47  CALCIUM 8.6*  MG 1.4*   ------------------------------------------------------------------------------------------------------------------  Cardiac Enzymes No results for input(s): TROPONINI in the last 168 hours. ------------------------------------------------------------------------------------------------------------------  RADIOLOGY:  No results found.   ASSESSMENT AND PLAN:   75 year old female with past medical history of COPD, hypertension, history of seizures, peripheral neuropathy, anxiety/depression, psychogenic polydipsia, hyperlipidemia presented to the hospital due to acute on chronic respiratory failure with COPD exacerbation.  1. COPD Exacerbation - still continues to have some wheezing, bronchospasm. -Was on IV steroids but taken off due to agitation. -Continue duo nebs, Pulmicort nebs. And if not improving would consider starting low-dose IV steroids again.      2. AMS/Encephalopathy - likely due to UTI/Hyponatremia  - Continue IV ceftriaxone for the UTI. Sodium is 126 but stable. -Patient is still confused but not agitated. We'll discuss with psychiatry tomorrow if we can taper her scheduled Ativan and continue Zyprexa.  3. Hyponatremia-patient has history of psychogenic polydipsia.  -As per nephrology suspected dehydration and related to underlying UTI. Patient was on 3% saline and now is off of it. Sodium is 126 and stable. Continue water restriction and follow sodium.  4. Urinary tract infection-secondary to Escherichia coli. Continue ceftriaxone.  5. History of seizures-continue Dilantin.  6. Hypokalemia/hypomagnesemia-potassium improved with supplementation. -We'll give 1 dose of magnesium  sulfate.  7. Urinary retention-patient's  Foley was removed yesterday but she had urinary retention and therefore Foley placed back. I will start her on some Flomax, plan to remove Foley next 24 hours again. -Check a postvoid residual post Foley removal.  Await PT eval.   All the records are reviewed and case discussed with Care Management/Social Workerr. Management plans discussed with the patient, family and they are in agreement.  CODE STATUS: DO NOT RESUSCITATE  DVT Prophylaxis: Lovenox  TOTAL TIME TAKING CARE OF THIS PATIENT: 30 minutes.   POSSIBLE D/C IN 2-3 DAYS, DEPENDING ON CLINICAL CONDITION.   Houston SirenSAINANI,Geneva Barrero J M.D on 05/29/2015 at 1:41 PM  Between 7am to 6pm - Pager - [phone]  After 6pm go to [email or web address] - password [email or web address] [facility]  [facility] [place] [facility]  Office  [phone]  CC: Primary care physician; Bobbye MortonSHARON A REILLY, MD

## 2015-05-29 NOTE — Progress Notes (Signed)
Patient alert and oriented x2, forgetful. Resting comfortably. Patient ST on telemetry. Will continue to assess. Trudee KusterBrandi R Mansfield

## 2015-05-29 NOTE — Progress Notes (Signed)
Peri care performed prior to 16 french foley catheter being placed. urine return. F/C secured to right leg with strap. Pt. Tolerated procedure well. Will continue to monitor pt.

## 2015-05-29 NOTE — Progress Notes (Signed)
Pt. Slept peacefully throughout the night. Mitts were not required. Pt. Tolerating foley with no complaints. Will continue to monitor.

## 2015-05-29 NOTE — Care Management Important Message (Signed)
Important Message  Patient Details  Name: Candace Woods MRN: [id number] Date of Birth: 1940/07/07   Medicare Important Message Given:  Yes    Ishika Chesterfield A, RN 05/29/2015, 3:22 PM

## 2015-05-30 LAB — BASIC METABOLIC PANEL
ANION GAP: 5 (ref 5–15)
BUN: 10 mg/dL (ref 6–20)
CALCIUM: 8.2 mg/dL — AB (ref 8.9–10.3)
CO2: 33 mmol/L — AB (ref 22–32)
Chloride: 87 mmol/L — ABNORMAL LOW (ref 101–111)
Creatinine, Ser: 0.5 mg/dL (ref 0.44–1.00)
Glucose, Bld: 144 mg/dL — ABNORMAL HIGH (ref 65–99)
POTASSIUM: 4.4 mmol/L (ref 3.5–5.1)
Sodium: 125 mmol/L — ABNORMAL LOW (ref 135–145)

## 2015-05-30 LAB — CULTURE, BLOOD (ROUTINE X 2)
CULTURE: NO GROWTH
CULTURE: NO GROWTH

## 2015-05-30 LAB — PHENYTOIN LEVEL, FREE AND TOTAL
Phenytoin, Free: 0.6 ug/mL — ABNORMAL LOW (ref 1.0–2.0)
Phenytoin, Total: 3.8 ug/mL — ABNORMAL LOW (ref 10.0–20.0)

## 2015-05-30 LAB — MAGNESIUM: Magnesium: 1.6 mg/dL — ABNORMAL LOW (ref 1.7–2.4)

## 2015-05-30 MED ORDER — PHENYTOIN SODIUM EXTENDED 100 MG PO CAPS
300.0000 mg | ORAL_CAPSULE | Freq: Every day | ORAL | Status: DC
  Administered 2015-05-30 – 2015-06-06 (×8): 300 mg via ORAL
  Filled 2015-05-30 (×9): qty 3

## 2015-05-30 MED ORDER — SODIUM CHLORIDE 1 G PO TABS
1.0000 g | ORAL_TABLET | Freq: Two times a day (BID) | ORAL | Status: DC
  Administered 2015-05-30 – 2015-06-07 (×16): 1 g via ORAL
  Filled 2015-05-30 (×16): qty 1

## 2015-05-30 MED ORDER — LORAZEPAM 2 MG/ML IJ SOLN
1.0000 mg | Freq: Two times a day (BID) | INTRAMUSCULAR | Status: DC
  Administered 2015-05-30 – 2015-06-04 (×10): 1 mg via INTRAVENOUS
  Filled 2015-05-30 (×10): qty 1

## 2015-05-30 MED ORDER — MAGNESIUM SULFATE 2 GM/50ML IV SOLN
2.0000 g | Freq: Once | INTRAVENOUS | Status: AC
  Administered 2015-05-30: 2 g via INTRAVENOUS
  Filled 2015-05-30: qty 50

## 2015-05-30 NOTE — Progress Notes (Signed)
[employer] - [place] at [facility]   PATIENT NAME: Candace Woods    MR#:  [id number]  DATE OF BIRTH:  [birth year]  SUBJECTIVE:   Sodium still 125 this a.m. HR.  Patient still has periods of tachycardia which resolves spontaneously. Mental status much improved. Shortness of breath and respiratory distress improved  REVIEW OF SYSTEMS:    Review of Systems  Unable to perform ROS   Nutrition: Regular Tolerating Diet: Yes Tolerating PT: Evaluation noted   DRUG ALLERGIES:   Allergies  Allergen Reactions  . Aspirin   . Codeine   . Contrast Media [Iodinated Diagnostic Agents] Hives    Hives with contrast media injections  . Penicillins   . Sulfa Antibiotics     VITALS:  Blood pressure 129/76, pulse 87, temperature 98 F (36.7 C), temperature source Oral, resp. rate 20, height  (1.626 m), weight 71.668 kg (158 lb), SpO2 97 %.  PHYSICAL EXAMINATION:   Physical Exam  GENERAL:  75 y.o.-year-old patient lying in the bed in no acute distress.  EYES: Pupils equal, round, reactive to light and accommodation. No scleral icterus. Extraocular muscles intact.  HEENT: Head atraumatic, normocephalic. Oropharynx and nasopharynx clear.  NECK:  Supple, no jugular venous distention. No thyroid enlargement, no tenderness.  LUNGS: Diffuse end expiratory wheezing, rhonchi bilaterally. No use of accessory muscles, no dullness to percussion.  CARDIOVASCULAR: S1, S2 normal. No murmurs, rubs, or gallops.  ABDOMEN: Soft, nontender, nondistended. Bowel sounds present. No organomegaly or mass.  EXTREMITIES: No cyanosis, clubbing or edema b/l.    NEUROLOGIC: Cranial nerves II through XII are intact. No focal Motor or sensory deficits b/l. Globally weak, positive torticollis PSYCHIATRIC: The patient is alert and oriented x 2.  SKIN: No obvious rash, lesion, or ulcer.    LABORATORY PANEL:   CBC  Recent Labs Lab 05/27/15 0611  WBC 10.9  HGB 14.9  HCT 44.8  PLT 183    ------------------------------------------------------------------------------------------------------------------  Chemistries   Recent Labs Lab 05/30/15 0412  NA 125*  K 4.4  CL 87*  CO2 33*  GLUCOSE 144*  BUN 10  CREATININE 0.50  CALCIUM 8.2*  MG 1.6*   ------------------------------------------------------------------------------------------------------------------  Cardiac Enzymes No results for input(s): TROPONINI in the last 168 hours. ------------------------------------------------------------------------------------------------------------------  RADIOLOGY:  No results found.   ASSESSMENT AND PLAN:   75 year old female with past medical history of COPD, hypertension, history of seizures, peripheral neuropathy, anxiety/depression, psychogenic polydipsia, hyperlipidemia presented to the hospital due to acute on chronic respiratory failure with COPD exacerbation.  1. COPD Exacerbation - wheezing, bronchospasm improved.  -Was on IV steroids but taken off due to agitation. -Continue duo nebs, Pulmicort nebs. Improving.      2. AMS/Encephalopathy - likely due to UTI/Hyponatremia  - Finished treatment for UTI and d/c Ceftriaxone. Sodium is 125 today. -Patient is still confused but improving.  Will taper Ativan.  Cont. Zyprexa.   3. Hyponatremia-patient has history of psychogenic polydipsia. This is not likely the cause presently.  - started on salt taps and also fluid restriction as per Nephro. Follow Sodium and it's 125 today.   4. Urinary tract infection-secondary to Escherichia coli.  -Finished treatment with antibiotics.  5. History of seizures-continue Dilantin and will switch to Oral.   6. Hypokalemia/hypomagnesemia-potassium improved with supplementation. - Mg. Still low today and will give another dose of mg. Sulfate today.   7. Urinary retention-DC Foley today. Continue Flomax. Check postvoid residual today.  PT eval noted and likely will need SNF  when stable.   All the records are reviewed and case discussed with Care Management/Social Workerr. Management plans discussed with the patient, family and they are in agreement.  CODE STATUS: DO NOT RESUSCITATE  DVT Prophylaxis: Lovenox  TOTAL TIME TAKING CARE OF THIS PATIENT: 30 minutes.   POSSIBLE D/C IN 2-3 DAYS, DEPENDING ON CLINICAL CONDITION.   [place] SirenSAINANI,Jennica Tagliaferri J M.D on 05/30/2015 at 2:22 PM  Between 7am to 6pm - Pager - [phone]  After 6pm go to [email or web address] - password [email or web address]  [facility]  [facility]  Office  [phone]  CC: Primary care physician; Bobbye MortonSHARON A REILLY, MD

## 2015-05-30 NOTE — Progress Notes (Signed)
Foley catheter discontinued per md orders.

## 2015-05-30 NOTE — Progress Notes (Signed)
[facility]  ROUNDING NOTE   Subjective:  Some confusion this morning.  Na 125   Objective:  Vital signs in last 24 hours:  Temp:  [98.7 F (37.1 C)-100.2 F (37.9 C)] 100.2 F (37.9 C) (04/17 0743) Pulse Rate:  [93-107] 107 (04/17 0743) Resp:  [16-17] 17 (04/17 0743) BP: (94-138)/(57-68) 94/68 mmHg (04/17 0743) SpO2:  [86 %-96 %] 96 % (04/17 0851)  Weight change:  Filed Weights   05/22/15 0810 05/22/15 1321 05/26/15 1428  Weight: 75.297 kg (166 lb) 82 kg (180 lb 12.4 oz) 71.668 kg (158 lb)    Intake/Output: I/O last 3 completed shifts: In: 480 [P.O.:480] Out: 3050 [Urine:3050]   Intake/Output this shift:  Total I/O In: 913 [P.O.:360; I.V.:3; Other:550] Out: -   Physical Exam: General: No acute distress  Head: Normocephalic, atraumatic. Dry oral mucosal membranes  Eyes: Anicteric  Neck: +torticollis  Lungs:  Rales on the right  Heart: S1S2 no rubs, irregular tachycardic  Abdomen:  Soft, nontender, BS present   Extremities: no peripheral edema.  Neurologic: Nonfocal, moving all four extremities, Able to follow simple commands   Skin: No lesions  Psych mumbles    Basic Metabolic Panel:  Recent Labs Lab 05/26/15 0554 05/27/15 0611 05/28/15 0658 05/29/15 0809 05/30/15 0412  NA 128* 130* 126* 126* 125*  K 3.3* 3.4* 2.8* 4.0 4.4  CL 89* 89* 85* 87* 87*  CO2 31 30 32 31 33*  GLUCOSE 138* 128* 131* 183* 144*  BUN 13 12 9 10 10   CREATININE 0.54 0.45 0.40* 0.47 0.50  CALCIUM 8.9 9.1 8.6* 8.6* 8.2*  MG  --   --   --  1.4* 1.6*    Liver Function Tests: No results for input(s): AST, ALT, ALKPHOS, BILITOT, PROT, ALBUMIN in the last 168 hours. No results for input(s): LIPASE, AMYLASE in the last 168 hours. No results for input(s): AMMONIA in the last 168 hours.  CBC:  Recent Labs Lab 05/24/15 0401 05/27/15 0611  WBC 9.3 10.9  HGB 14.8 14.9  HCT 43.4 44.8  MCV 88.1 90.3  PLT 164 183    Cardiac Enzymes: No results for input(s):  CKTOTAL, CKMB, CKMBINDEX, TROPONINI in the last 168 hours.  BNP: Invalid input(s): POCBNP  CBG: No results for input(s): GLUCAP in the last 168 hours.  Microbiology: Results for orders placed or performed during the hospital encounter of 05/22/15  Rapid Influenza A&B Antigens ([facility] only)     Status: None   Collection Time: 05/22/15  8:44 AM  Result Value Ref Range Status   Influenza A ([facility]) NEGATIVE NEGATIVE Final   Influenza B ([facility]) NEGATIVE NEGATIVE Final  Blood culture (routine x 2)     Status: None (Preliminary result)   Collection Time: 05/22/15  8:44 AM  Result Value Ref Range Status   Specimen Description BLOOD LEFT HAND  Final   Special Requests BOTTLES DRAWN AEROBIC AND ANAEROBIC  1CC  Final   Culture NO GROWTH 4 DAYS  Final   Report Status PENDING  Incomplete  Blood culture (routine x 2)     Status: None (Preliminary result)   Collection Time: 05/22/15  8:44 AM  Result Value Ref Range Status   Specimen Description BLOOD LEFT WRIST  Final   Special Requests BOTTLES DRAWN AEROBIC AND ANAEROBIC  1CC  Final   Culture NO GROWTH 4 DAYS  Final   Report Status PENDING  Incomplete  Urine culture     Status: Abnormal   Collection Time: 05/22/15  11:31 AM  Result Value Ref Range Status   Specimen Description URINE, RANDOM  Final   Special Requests NONE  Final   Culture >=100,000 COLONIES/mL ESCHERICHIA COLI (A)  Final   Report Status 05/24/2015 FINAL  Final   Organism ID, Bacteria ESCHERICHIA COLI (A)  Final      Susceptibility   Escherichia coli - MIC*    AMPICILLIN >=32 RESISTANT Resistant     CEFAZOLIN 8 SENSITIVE Sensitive     CEFTRIAXONE <=1 SENSITIVE Sensitive     CIPROFLOXACIN >=4 RESISTANT Resistant     GENTAMICIN <=1 SENSITIVE Sensitive     IMIPENEM <=0.25 SENSITIVE Sensitive     NITROFURANTOIN <=16 SENSITIVE Sensitive     TRIMETH/SULFA <=20 SENSITIVE Sensitive     AMPICILLIN/SULBACTAM 4 SENSITIVE Sensitive     PIP/TAZO <=4 SENSITIVE Sensitive     Extended  ESBL NEGATIVE Sensitive     * >=100,000 COLONIES/mL ESCHERICHIA COLI  MRSA PCR Screening     Status: None   Collection Time: 05/22/15  1:19 PM  Result Value Ref Range Status   MRSA by PCR NEGATIVE NEGATIVE Final    Comment:        The GeneXpert MRSA Assay (FDA approved for NASAL specimens only), is one component of a comprehensive MRSA colonization surveillance program. It is not intended to diagnose MRSA infection nor to guide or monitor treatment for MRSA infections.     Coagulation Studies: No results for input(s): LABPROT, INR in the last 72 hours.  Urinalysis: No results for input(s): COLORURINE, LABSPEC, PHURINE, GLUCOSEU, HGBUR, BILIRUBINUR, KETONESUR, PROTEINUR, UROBILINOGEN, NITRITE, LEUKOCYTESUR in the last 72 hours.  Invalid input(s): APPERANCEUR    Imaging: No results found.   Medications:     . antiseptic oral rinse  7 mL Mouth Rinse BID  . budesonide (PULMICORT) nebulizer solution  0.25 mg Nebulization BID  . cefTRIAXone (ROCEPHIN)  IV  1 g Intravenous Q24H  . enoxaparin (LOVENOX) injection  40 mg Subcutaneous Q24H  . feeding supplement (ENSURE ENLIVE)  237 mL Oral BID BM  . ipratropium-albuterol  3 mL Nebulization Q6H  . LORazepam  1 mg Intravenous Q6H  . metoprolol tartrate  25 mg Oral BID  . OLANZapine zydis  5 mg Oral QHS  . phenytoin (DILANTIN) IV  100 mg Intravenous 3 times per day  . potassium chloride  20 mEq Oral BID  . sodium chloride flush  3 mL Intravenous Q12H  . tamsulosin  0.4 mg Oral Daily   acetaminophen **OR** [DISCONTINUED] acetaminophen, albuterol, haloperidol lactate, hydrALAZINE, metoprolol, morphine injection, ondansetron **OR** ondansetron (ZOFRAN) IV, sodium chloride flush  Assessment/ Plan:  75 y.o. female with past medical history of COPD, hypertension, seizure disorder, peripheral neuropathy, anxiety, depression, history of psychogenic polydipsia who presented to [facility] with mechanical fall.  Patient known to [place] from prior admissions as we saw her for hyponatremia in 2014.  1.  Hyponatremia, with prior hx of psychogenic polydipsia: sodium 125.  - fluid restriction starting today.  - Start salt tabs  2. Hypokalemia:  - PO potassium replacement. Replace magnesium  3.  Urinary Tract Infection: E Coli - ceftriaxone.   4. Acute exacerbation of COPD:  - supportive care.     LOS: 8 Kai Calico 4/17/201710:20 AM

## 2015-05-30 NOTE — Plan of Care (Signed)
Problem: Safety: Goal: Ability to remain free from injury will improve Outcome: Progressing Fall precautions in place  Problem: Pain Managment: Goal: General experience of comfort will improve Outcome: Progressing Prn medications   

## 2015-05-30 NOTE — Progress Notes (Signed)
Physical Therapy Treatment Patient Details Name: Candace Woods MRN: [id number] DOB: [birth year] Today's Date: 05/30/2015    History of Present Illness Candace Woods is a 75 y.o. female with a known history of COPD non-oxygen requiring presenting the hospital after mechanical fall at home unable to rise off floor secondary to weakness. On arrival to emergency department noted to be hypoxic with O2 saturations in the mid 70s on room air and had improvement of oxygenation requiring nonrebreather however given increased work of breathing placed on BiPAP therapy. O2 saturations remained in the high 80s. She complains of nonproductive cough shortness of breath however, states this is her baseline. Family reports 2 falls in the last 12 months. Pt is still confused by improved from prior days. At baseline she is AOx4. Pt [facility] a [facility] of [facility] where she [facility] on Tues[facility]. She has a HH Aid 7d/wk for 3 hours/day.    PT Comments    Pt is still intermittently confused and mildly agitated throughout session. However she agrees to participate with therapy and is able to complete all bed exercises as instructed. Pt with balance impairment during sit to stand transfers and attempted ambulation. Unable to ambulate at this time. Pt will need SNF placement at discharge in order to return to full function at home. Pt will benefit from skilled PT services to address deficits in strength, balance, and mobility in order to return to full function at home.    Follow Up Recommendations  SNF     Equipment Recommendations  None recommended by PT    Recommendations for Other Services       Precautions / Restrictions Precautions Precautions: Fall Restrictions Weight Bearing Restrictions: No    Mobility  Bed Mobility Overal bed mobility: Needs Assistance Bed Mobility: Supine to Sit;Sit to Supine     Supine to sit: Supervision Sit to supine: Supervision   General bed mobility comments: Pt demonstrates  reasonable strength with bed mobility with use of bed rails and HOB elevated. Requires trendelenberg position to scoot up toward [facility] at end of session.  Transfers Overall transfer level: Needs assistance Equipment used: Rolling walker (2 wheeled) Transfers: Sit to/from Stand Sit to Stand: Mod assist         General transfer comment: Pt with decreased LE strength and balance with sit to stand transfers. Cues for proper hand placement and extended time required to come to standing. Once in standing she requires continual minA+1 support for balance. Pt able to perform standing marches with minA+1 but with attempts at forward stepping she loses balance and falls back onto bed. Unable to ambulate at this time  Ambulation/Gait             General Gait Details: Unable   Stairs            Wheelchair Mobility    Modified Rankin (Stroke Patients Only)       Balance Overall balance assessment: Needs assistance Sitting-balance support: No upper extremity supported Sitting balance-Leahy Scale: Fair     Standing balance support: Bilateral upper extremity supported Standing balance-Leahy Scale: Poor                      Cognition Arousal/Alertness: Awake/alert Behavior During Therapy: Agitated Overall Cognitive Status: Impaired/Different from baseline Area of Impairment: Orientation Orientation Level: Disoriented to;Situation (AOx3, disoriented to situation)   Memory: Decreased short-term memory              Exercises General Exercises -  Lower Extremity Ankle Circles/Pumps: Strengthening;Both;15 reps;Supine Quad Sets: Strengthening;Both;15 reps;Supine Long Arc Quad: Strengthening;Both;Seated;10 reps Heel Slides: Strengthening;Both;15 reps;Supine;Other (comment) (Additional x 10 in sitting with manual resistance) Hip ABduction/ADduction: Strengthening;Both;15 reps;Supine;Other (comment) (Additional x10 in sitting ) Straight Leg Raises: Strengthening;Both;15  reps;Supine Hip Flexion/Marching: Strengthening;Both;10 reps;Seated Heel Raises: Strengthening;Both;10 reps;Seated    General Comments        Pertinent Vitals/Pain Pain Assessment: No/denies pain Pain Intervention(s): Monitored during session    Home Living                      Prior Function            PT Goals (current goals can now be found in the care plan section) Acute Rehab PT Goals Patient Stated Goal: Unable to provide. Family would like pt to return home at discharge PT Goal Formulation: With family Time For Goal Achievement: 06/10/15 Potential to Achieve Goals: Fair Progress towards PT goals: Progressing toward goals    Frequency  Min 2X/week    PT Plan Current plan remains appropriate    Co-evaluation             End of Session Equipment Utilized During Treatment: Gait belt;Oxygen;Other (comment) (4 L/min) Activity Tolerance: Patient limited by lethargy;Treatment limited secondary to agitation Patient left: in bed;with call bell/phone within reach;with bed alarm set     Time: [id number] PT Time Calculation (min) (ACUTE ONLY): 26 min  Charges:  $Therapeutic Exercise: 8-22 mins $Therapeutic Activity: 8-22 mins                    G Codes:      Sharalyn InkJason D Huprich PT, DPT   Huprich,Jason 05/30/2015, 4:33 PM

## 2015-05-30 NOTE — Care Management Note (Addendum)
Case Management Note  Patient Details  Name: Candace GripDonna M Woods MRN: [id number] Date of Birth: [birth year]  Subjective/Objective:     Case Management is following for discharge planning. Remains confused, NA=125. Has foley cath. Weak. Admitted 05/22/14 with COPD, UTI, hyponatremia. Hx of seizures and polydipsia. Nephrology consult. Currently taking salt tablets, potassium tablets and magnesium replacement. No home oxygen. [facility]-PT recommending SNF . However, once her electrolyte issues are resolved Candace Woods may feel strong enough to return home. Has home health aids from [facility] 2 x weekly.  .                Action/Plan:   Expected Discharge Date:                  Expected Discharge Plan:     In-House Referral:     Discharge planning Services     Post Acute Care Choice:    Choice offered to:     DME Arranged:    DME Agency:     HH Arranged:    HH Agency:     Status of Service:     Medicare Important Message Given:  Yes Date Medicare IM Given:    Medicare IM give by:    Date Additional Medicare IM Given:    Additional Medicare Important Message give by:     If discussed at Long Length of Stay Meetings, dates discussed:    Additional Comments:  Johnross Nabozny A, RN 05/30/2015, 4:12 PM

## 2015-05-30 NOTE — Consult Note (Signed)
[facility] Consult   Reason for Consult:  Follow-up note for this 75 year old woman with a history of chronic mental health problems as well as acute delirium. Patient interviewed chart reviewed labs reviewed. Referring Physician:  Hour Patient Identification: Candace Woods MRN:  [id number] Principal Diagnosis: Acute delirium Diagnosis:   Patient Active Problem List   Diagnosis Date Noted  . Acute delirium [R41.0] 05/26/2015  . 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 Seizures ([place]) [R56.9] 05/26/2015  . Psychosis [F29] 05/26/2015  . Acute on chronic respiratory failure with hypoxia (HCC) [J96.21] 05/22/2015    Total Time spent with patient: 20 minutes  Subjective:   Candace Woods is a 75 y.o. female patient admitted with "I think I'm fine".  HPI:  Patient interviewed. Chart reviewed. Patient was awake in her room when we came by. Somewhat cooperative. Somewhat irritable but denied any psychiatric problems. Said that she thought her mood was fine. Denied hallucinations. She is a Candace bit confused. Although she knows she is in the hospital she still doesn't know how long she has been here and couldn't really describe why she was in the hospital. She tells me several times that she doesn't mess with psychiatrists although I know that she's had chronic mental health problems in the past.  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 Earlier in her hospitalization she was delirious. Responded initially with pulling out of the catatonia and now seems to be getting a Candace better with appropriate medicine.  Risk to Self: Is patient at risk for suicide?: No Risk to Others:   Prior Inpatient Therapy:   Prior Outpatient Therapy:    Past Medical History:  Past Medical History  Diagnosis Date  . COPD (chronic obstructive pulmonary disease) (Candace Woods)   . Hypertension   . Seizures (Candace Woods)   . Peripheral neuropathy (Candace Woods)   .  Torticollis Right  . Anxiety   . Depression   . Psychogenic polydipsia   . MRSA pneumonia (Candace Woods)     2014  . Hyperlipemia   . Asthma     Past Surgical History  Procedure Laterality Date  . Abdominal hysterectomy     Family History:  Family History  Problem Relation Age of Onset  . Hypertension Other    Family Psychiatric  History: None reported by her Social History:  History  Alcohol Use No     History  Drug Use No    Social History   Social History  . Marital Status: Widowed    Spouse Name: N/A  . Number of Children: N/A  . Years of Education: N/A   Social History Main Topics  . Smoking status: Current Every Day Smoker -- 2.00 packs/day for 55 years    Types: Cigarettes  . Smokeless tobacco: None  . Alcohol Use: No  . Drug Use: No  . Sexual Activity: Not Asked   Other Topics Concern  . None   Social History Narrative   Additional Social History:    Allergies:   Allergies  Allergen Reactions  . Aspirin   . Codeine   . Contrast Media [Iodinated Diagnostic Agents] Hives    Hives with contrast media injections  . Penicillins   . Sulfa Antibiotics     Labs:  Results for orders placed or performed during the hospital encounter of 05/22/15 (from the past 48 hour(s))  Basic metabolic panel     Status: Abnormal   Collection Time: 05/29/15  8:09 AM  Result  Value Ref Range   Sodium 126 (L) 135 - 145 mmol/L   Potassium 4.0 3.5 - 5.1 mmol/L   Chloride 87 (L) 101 - 111 mmol/L   CO2 31 22 - 32 mmol/L   Glucose, Bld 183 (H) 65 - 99 mg/dL   BUN 10 6 - 20 mg/dL   Creatinine, Ser 0.47 0.44 - 1.00 mg/dL   Calcium 8.6 (L) 8.9 - 10.3 mg/dL   GFR calc non Af Amer >60 >60 mL/min   GFR calc Af Amer >60 >60 mL/min    Comment: (NOTE) The eGFR has been calculated using the CKD EPI equation. This calculation has not been validated in all clinical situations. eGFR's persistently <60 mL/min signify possible Chronic Kidney Disease.    Anion gap 8 5 - 15  Magnesium      Status: Abnormal   Collection Time: 05/29/15  8:09 AM  Result Value Ref Range   Magnesium 1.4 (L) 1.7 - 2.4 mg/dL  Basic metabolic panel     Status: Abnormal   Collection Time: 05/30/15  4:12 AM  Result Value Ref Range   Sodium 125 (L) 135 - 145 mmol/L   Potassium 4.4 3.5 - 5.1 mmol/L   Chloride 87 (L) 101 - 111 mmol/L   CO2 33 (H) 22 - 32 mmol/L   Glucose, Bld 144 (H) 65 - 99 mg/dL   BUN 10 6 - 20 mg/dL   Creatinine, Ser 0.50 0.44 - 1.00 mg/dL   Calcium 8.2 (L) 8.9 - 10.3 mg/dL   GFR calc non Af Amer >60 >60 mL/min   GFR calc Af Amer >60 >60 mL/min    Comment: (NOTE) The eGFR has been calculated using the CKD EPI equation. This calculation has not been validated in all clinical situations. eGFR's persistently <60 mL/min signify possible Chronic Kidney Disease.    Anion gap 5 5 - 15  Magnesium     Status: Abnormal   Collection Time: 05/30/15  4:12 AM  Result Value Ref Range   Magnesium 1.6 (L) 1.7 - 2.4 mg/dL    Current Facility-Administered Medications  Medication Dose Route Frequency Provider Last Rate Last Dose  . acetaminophen (TYLENOL) tablet 650 mg  650 mg Oral Q6H PRN Lytle Butte, MD   650 mg at 05/30/15 0751  . albuterol (PROVENTIL) (2.5 MG/3ML) 0.083% nebulizer solution 2.5 mg  2.5 mg Nebulization Q3H PRN Wilhelmina Mcardle, MD      . antiseptic oral rinse (CPC / CETYLPYRIDINIUM CHLORIDE 0.05%) solution 7 mL  7 mL Mouth Rinse BID Wilhelmina Mcardle, MD   7 mL at 05/30/15 0941  . budesonide (PULMICORT) nebulizer solution 0.25 mg  0.25 mg Nebulization BID Wilhelmina Mcardle, MD   0.25 mg at 05/30/15 0849  . enoxaparin (LOVENOX) injection 40 mg  40 mg Subcutaneous Q24H Wilhelmina Mcardle, MD   40 mg at 05/30/15 9373  . feeding supplement (ENSURE ENLIVE) (ENSURE ENLIVE) liquid 237 mL  237 mL Oral BID BM Harmeet Singh, MD   237 mL at 05/30/15 1445  . haloperidol lactate (HALDOL) injection 1-2 mg  1-2 mg Intravenous Q3H PRN Wilhelmina Mcardle, MD      . hydrALAZINE (APRESOLINE)  injection 10-40 mg  10-40 mg Intravenous Q4H PRN Wilhelmina Mcardle, MD   40 mg at 05/26/15 1216  . ipratropium-albuterol (DUONEB) 0.5-2.5 (3) MG/3ML nebulizer solution 3 mL  3 mL Nebulization Q6H Wilhelmina Mcardle, MD   3 mL at 05/30/15 1344  . LORazepam (ATIVAN) injection  1 mg  1 mg Intravenous BID Henreitta Leber, MD      . metoprolol (LOPRESSOR) injection 2.5-5 mg  2.5-5 mg Intravenous Q3H PRN Wilhelmina Mcardle, MD   5 mg at 05/26/15 0165  . metoprolol tartrate (LOPRESSOR) tablet 25 mg  25 mg Oral BID Henreitta Leber, MD   25 mg at 05/29/15 2116  . OLANZapine zydis (ZYPREXA) disintegrating tablet 5 mg  5 mg Oral QHS Gonzella Lex, MD   5 mg at 05/29/15 2113  . ondansetron (ZOFRAN) tablet 4 mg  4 mg Oral Q6H PRN Lytle Butte, MD       Or  . ondansetron [facility]) injection 4 mg  4 mg Intravenous Q6H PRN Lytle Butte, MD      . phenytoin (DILANTIN) ER capsule 300 mg  300 mg Oral QHS Henreitta Leber, MD      . potassium chloride SA (K-DUR,KLOR-CON) CR tablet 20 mEq  20 mEq Oral BID Henreitta Leber, MD   20 mEq at 05/30/15 0937  . sodium chloride flush (NS) 0.9 % injection 10-40 mL  10-40 mL Intracatheter PRN Dustin Flock, MD   10 mL at 05/28/15 0837  . sodium chloride flush (NS) 0.9 % injection 3 mL  3 mL Intravenous Q12H Lytle Butte, MD   3 mL at 05/30/15 0941  . sodium chloride tablet 1 g  1 g Oral BID WC Lavonia Dana, MD   1 g at 05/30/15 1703  . tamsulosin (FLOMAX) capsule 0.4 mg  0.4 mg Oral Daily Henreitta Leber, MD   0.4 mg at 05/30/15 5374    Musculoskeletal: Strength & Muscle Tone: decreased Gait & Station: unable to stand Patient leans: N/A  Psychiatric Specialty Exam: Review of Systems  Constitutional: Negative.   HENT: Negative.   Eyes: Negative.   Respiratory: Negative.   Cardiovascular: Negative.   Gastrointestinal: Negative.   Musculoskeletal: Positive for myalgias and falls.  Skin: Negative.   Neurological: Negative.   Psychiatric/Behavioral: Negative for depression,  suicidal ideas, hallucinations, memory loss and substance abuse. The patient is not nervous/anxious and does not have insomnia.     Blood pressure 129/76, pulse 87, temperature 98 F (36.7 C), temperature source Oral, resp. rate 20, height 5' 4"  (1.626 m), weight 71.668 kg (158 lb), SpO2 97 %.Body mass index is 27.11 kg/(m^2).  General Appearance: Disheveled  Eye Contact::  Minimal  Speech:  Slow  Volume:  Decreased  Mood:  Anxious  Affect:  Depressed  Thought Process:  Tangential  Orientation:  Full (Time, Place, and Person)  Thought Content:  Negative  Suicidal Thoughts:  No  Homicidal Thoughts:  No  Memory:  Immediate;   Fair Recent;   Fair Remote;   Fair  Judgement:  Fair  Insight:  Fair  Psychomotor Activity:  Decreased  Concentration:  Fair  Recall:  [employer] of Knowledge:Fair  Language: Fair  Akathisia:  No  Handed:  Right  AIMS (if indicated):     Assets:  Agricultural consultant Housing Social Support  ADL's:  Impaired  Cognition: Impaired,  Mild  Sleep:      Treatment Plan Summary: Daily contact with patient to assess and evaluate symptoms and progress in treatment, Medication management and Plan mentally she appears to be much better. Although she is slightly confused she is not really delirious. I do notice that her torticollis is back. I hope that is not a result of the Candace tiny dose of  olanzapine that she is getting. We will continue to keep an eye on that. Mood and affect seem to be much better. No change to medication for today.  Disposition: Supportive therapy provided about ongoing stressors.  Alethia Berthold, MD 05/30/2015 6:49 PM

## 2015-05-31 ENCOUNTER — Inpatient Hospital Stay: Payer: Medicare (Managed Care)

## 2015-05-31 LAB — BASIC METABOLIC PANEL
Anion gap: 9 (ref 5–15)
BUN: 9 mg/dL (ref 6–20)
CHLORIDE: 87 mmol/L — AB (ref 101–111)
CO2: 27 mmol/L (ref 22–32)
CREATININE: 0.5 mg/dL (ref 0.44–1.00)
Calcium: 8.3 mg/dL — ABNORMAL LOW (ref 8.9–10.3)
GFR calc Af Amer: >60 mL/min (ref >60)
GFR calc non Af Amer: >60 mL/min (ref >60)
Glucose, Bld: 94 mg/dL (ref 65–99)
POTASSIUM: 4.7 mmol/L (ref 3.5–5.1)
Sodium: 123 mmol/L — ABNORMAL LOW (ref 135–145)

## 2015-05-31 LAB — MAGNESIUM: MAGNESIUM: 1.8 mg/dL (ref 1.7–2.4)

## 2015-05-31 MED ORDER — METOPROLOL TARTRATE 25 MG PO TABS
25.0000 mg | ORAL_TABLET | Freq: Four times a day (QID) | ORAL | Status: DC
  Administered 2015-05-31 – 2015-06-07 (×22): 25 mg via ORAL
  Filled 2015-05-31 (×24): qty 1

## 2015-05-31 MED ORDER — METOPROLOL TARTRATE 25 MG PO TABS: 25.0000 mg | ORAL_TABLET | Freq: Two times a day (BID) | ORAL | Status: DC

## 2015-05-31 MED ORDER — SODIUM CHLORIDE 3 % IV SOLN
INTRAVENOUS | Status: DC
  Administered 2015-05-31: 30 mL/h via INTRAVENOUS
  Filled 2015-05-31 (×2): qty 500

## 2015-05-31 MED ORDER — ACETAMINOPHEN 325 MG PO TABS
325.0000 mg | ORAL_TABLET | Freq: Once | ORAL | Status: AC
  Administered 2015-05-31: 325 mg via ORAL

## 2015-05-31 MED ORDER — FUROSEMIDE 20 MG PO TABS
20.0000 mg | ORAL_TABLET | Freq: Every day | ORAL | Status: DC
  Administered 2015-05-31 – 2015-06-07 (×8): 20 mg via ORAL
  Filled 2015-05-31 (×8): qty 1

## 2015-05-31 NOTE — Progress Notes (Signed)
Patient is alert and oriented x 4 but forgetful. Denied any acute  pain  at this moment. Temp=103 and HR fluctuating from 129-146. Expiratory wheezes scattered through all lobes. Tylenol 650 mg PRN for fever administered and will recheck temperature in about an hour.  Dr. Sherryll BurgerShah notified with a new order for 2 sets of blood culture and Metoprolol 25 mg two times a day to control the HR. Will continue to monitor.

## 2015-05-31 NOTE — Plan of Care (Signed)
Problem: Safety: Goal: Ability to remain free from injury will improve Outcome: Progressing Fall precautions in place  Problem: Pain Managment: Goal: General experience of comfort will improve Outcome: Progressing Prn medications   

## 2015-05-31 NOTE — Clinical Social Work Note (Signed)
Patient is a [facility] and CSW has contacted patient's Child psychotherapistocial Worker [facility] April. CSW informed April what the PT recommendation was for STR. April stated that she is going to speak with their physician but they are leaning toward setting patient up at home with services because April states patient does not do well in a rehab setting. York SpanielMonica Krikor Willet MSW,LCSW [phone]

## 2015-05-31 NOTE — Consult Note (Signed)
[facility] Consult   Reason for Consult:  Follow-up note for this 75 year old woman with a history of chronic mental health problems as well as acute delirium. Patient interviewed chart reviewed labs reviewed. Referring Physician:  Hour Patient Identification: Candace Woods MRN:  [id number] Principal Diagnosis: Acute delirium Diagnosis:   Patient Active Problem List   Diagnosis Date Noted  . Acute delirium [R41.0] 05/26/2015  . 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 Seizures ([place]) [R56.9] 05/26/2015  . Psychosis [F29] 05/26/2015  . Acute on chronic respiratory failure with hypoxia (HCC) [J96.21] 05/22/2015    Total Time spent with patient: 20 minutes  Subjective:   Candace Woods is a 75 y.o. female patient admitted with "I think I'm fine".  HPI:  Patient interviewed. Chart reviewed. Patient was awake in her room when we came by. Somewhat cooperative. Somewhat irritable but denied any psychiatric problems. Said that she thought her mood was fine. Denied hallucinations. She is a little bit confused. Although she knows she is in the hospital she still doesn't know how long she has been here and couldn't really describe why she was in the hospital. She tells me several times that she doesn't mess with psychiatrists although I know that she's had chronic mental health problems in the past.  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 Earlier in her hospitalization she was delirious. Responded initially with pulling out of the catatonia and now seems to be getting a little better with appropriate medicine.  Risk to Self: Is patient at risk for suicide?: No Risk to Others:   Prior Inpatient Therapy:   Prior Outpatient Therapy:    Past Medical History:  Past Medical History  Diagnosis Date  . COPD (chronic obstructive pulmonary disease) ([place])   . Hypertension   . Seizures ([place])   . Peripheral neuropathy ([place])   .  Torticollis Right  . Anxiety   . Depression   . Psychogenic polydipsia   . MRSA pneumonia ([place])     2014  . Hyperlipemia   . Asthma     Past Surgical History  Procedure Laterality Date  . Abdominal hysterectomy     Family History:  Family History  Problem Relation Age of Onset  . Hypertension Other    Family Psychiatric  History: None reported by her Social History:  History  Alcohol Use No     History  Drug Use No    Social History   Social History  . Marital Status: Widowed    Spouse Name: N/A  . Number of Children: N/A  . Years of Education: N/A   Social History Main Topics  . Smoking status: Current Every Day Smoker -- 2.00 packs/day for 55 years    Types: Cigarettes  . Smokeless tobacco: None  . Alcohol Use: No  . Drug Use: No  . Sexual Activity: Not Asked   Other Topics Concern  . None   Social History Narrative   Additional Social History:    Allergies:   Allergies  Allergen Reactions  . Aspirin   . Codeine   . Contrast Media [Iodinated Diagnostic Agents] Hives    Hives with contrast media injections  . Penicillins   . Sulfa Antibiotics     Labs:  Results for orders placed or performed during the hospital encounter of 05/22/15 (from the past 48 hour(s))  Basic metabolic panel     Status: Abnormal   Collection Time: 05/30/15  4:12 AM  Result  Value Ref Range   Sodium 125 (L) 135 - 145 mmol/L   Potassium 4.4 3.5 - 5.1 mmol/L   Chloride 87 (L) 101 - 111 mmol/L   CO2 33 (H) 22 - 32 mmol/L   Glucose, Bld 144 (H) 65 - 99 mg/dL   BUN 10 6 - 20 mg/dL   Creatinine, Ser 0.50 0.44 - 1.00 mg/dL   Calcium 8.2 (L) 8.9 - 10.3 mg/dL   GFR calc non Af Amer >60 >60 mL/min   GFR calc Af Amer >60 >60 mL/min    Comment: (NOTE) The eGFR has been calculated using the CKD EPI equation. This calculation has not been validated in all clinical situations. eGFR's persistently <60 mL/min signify possible Chronic Kidney Disease.    Anion gap 5 5 - 15   Magnesium     Status: Abnormal   Collection Time: 05/30/15  4:12 AM  Result Value Ref Range   Magnesium 1.6 (L) 1.7 - 2.4 mg/dL  Basic metabolic panel     Status: Abnormal   Collection Time: 05/31/15  5:19 AM  Result Value Ref Range   Sodium 123 (L) 135 - 145 mmol/L   Potassium 4.7 3.5 - 5.1 mmol/L   Chloride 87 (L) 101 - 111 mmol/L   CO2 27 22 - 32 mmol/L   Glucose, Bld 94 65 - 99 mg/dL   BUN 9 6 - 20 mg/dL   Creatinine, Ser 0.50 0.44 - 1.00 mg/dL   Calcium 8.3 (L) 8.9 - 10.3 mg/dL   GFR calc non Af Amer >60 >60 mL/min   GFR calc Af Amer >60 >60 mL/min    Comment: (NOTE) The eGFR has been calculated using the CKD EPI equation. This calculation has not been validated in all clinical situations. eGFR's persistently <60 mL/min signify possible Chronic Kidney Disease.    Anion gap 9 5 - 15  Magnesium     Status: None   Collection Time: 05/31/15  5:19 AM  Result Value Ref Range   Magnesium 1.8 1.7 - 2.4 mg/dL    Current Facility-Administered Medications  Medication Dose Route Frequency Provider Last Rate Last Dose  . acetaminophen (TYLENOL) tablet 650 mg  650 mg Oral Q6H PRN Lytle Butte, MD   650 mg at 05/31/15 0507  . albuterol (PROVENTIL) (2.5 MG/3ML) 0.083% nebulizer solution 2.5 mg  2.5 mg Nebulization Q3H PRN Wilhelmina Mcardle, MD      . antiseptic oral rinse (CPC / CETYLPYRIDINIUM CHLORIDE 0.05%) solution 7 mL  7 mL Mouth Rinse BID Wilhelmina Mcardle, MD   7 mL at 05/31/15 0929  . budesonide (PULMICORT) nebulizer solution 0.25 mg  0.25 mg Nebulization BID Wilhelmina Mcardle, MD   0.25 mg at 05/31/15 0742  . enoxaparin (LOVENOX) injection 40 mg  40 mg Subcutaneous Q24H Wilhelmina Mcardle, MD   40 mg at 05/31/15 0929  . feeding supplement (ENSURE ENLIVE) (ENSURE ENLIVE) liquid 237 mL  237 mL Oral BID BM Harmeet Singh, MD   237 mL at 05/31/15 0930  . furosemide (LASIX) tablet 20 mg  20 mg Oral Daily Lavonia Dana, MD   20 mg at 05/31/15 1112  . haloperidol lactate (HALDOL) injection  1-2 mg  1-2 mg Intravenous Q3H PRN Wilhelmina Mcardle, MD      . hydrALAZINE (APRESOLINE) injection 10-40 mg  10-40 mg Intravenous Q4H PRN Wilhelmina Mcardle, MD   40 mg at 05/26/15 1216  . ipratropium-albuterol (DUONEB) 0.5-2.5 (3) MG/3ML nebulizer solution 3 mL  3 mL Nebulization Q6H Wilhelmina Mcardle, MD   3 mL at 05/31/15 1440  . LORazepam (ATIVAN) injection 1 mg  1 mg Intravenous BID Henreitta Leber, MD   1 mg at 05/31/15 0929  . metoprolol (LOPRESSOR) injection 2.5-5 mg  2.5-5 mg Intravenous Q3H PRN Wilhelmina Mcardle, MD   5 mg at 05/26/15 0350  . metoprolol tartrate (LOPRESSOR) tablet 25 mg  25 mg Oral BID Henreitta Leber, MD   25 mg at 05/31/15 0929  . OLANZapine zydis (ZYPREXA) disintegrating tablet 5 mg  5 mg Oral QHS Gonzella Lex, MD   5 mg at 05/30/15 2221  . ondansetron (ZOFRAN) tablet 4 mg  4 mg Oral Q6H PRN Lytle Butte, MD       Or  . ondansetron [facility]) injection 4 mg  4 mg Intravenous Q6H PRN Lytle Butte, MD   4 mg at 05/31/15 1604  . phenytoin (DILANTIN) ER capsule 300 mg  300 mg Oral QHS Henreitta Leber, MD   300 mg at 05/30/15 2221  . potassium chloride SA (K-DUR,KLOR-CON) CR tablet 20 mEq  20 mEq Oral BID Henreitta Leber, MD   20 mEq at 05/31/15 0929  . sodium chloride flush (NS) 0.9 % injection 10-40 mL  10-40 mL Intracatheter PRN Dustin Flock, MD   10 mL at 05/28/15 0837  . sodium chloride flush (NS) 0.9 % injection 3 mL  3 mL Intravenous Q12H Lytle Butte, MD   3 mL at 05/31/15 0827  . sodium chloride tablet 1 g  1 g Oral BID WC Lavonia Dana, MD   1 g at 05/31/15 0733  . tamsulosin (FLOMAX) capsule 0.4 mg  0.4 mg Oral Daily Henreitta Leber, MD   0.4 mg at 05/31/15 0938    Musculoskeletal: Strength & Muscle Tone: decreased Gait & Station: unable to stand Patient leans: N/A  Psychiatric Specialty Exam: Review of Systems  Constitutional: Negative.   HENT: Negative.   Eyes: Negative.   Respiratory: Negative.   Cardiovascular: Negative.   Gastrointestinal: Negative.    Musculoskeletal: Positive for myalgias and falls.  Skin: Negative.   Neurological: Negative.   Psychiatric/Behavioral: Negative for depression, suicidal ideas, hallucinations, memory loss and substance abuse. The patient is not nervous/anxious and does not have insomnia.     Blood pressure 146/70, pulse 95, temperature 98.8 F (37.1 C), temperature source Oral, resp. rate 22, height '5\' 4"'$  (1.626 m), weight 71.668 kg (158 lb), SpO2 92 %.Body mass index is 27.11 kg/(m^2).  General Appearance: Disheveled  Eye Contact::  Minimal  Speech:  Slow  Volume:  Decreased  Mood:  Anxious  Affect:  Depressed  Thought Process:  Tangential  Orientation:  Full (Time, Place, and Person)  Thought Content:  Negative  Suicidal Thoughts:  No  Homicidal Thoughts:  No  Memory:  Immediate;   Fair Recent;   Fair Remote;   Fair  Judgement:  Fair  Insight:  Fair  Psychomotor Activity:  Decreased  Concentration:  Fair  Recall:  [employer] of Knowledge:Fair  Language: Fair  Akathisia:  No  Handed:  Right  AIMS (if indicated):     Assets:  Agricultural consultant Housing Social Support  ADL's:  Impaired  Cognition: Impaired,  Mild  Sleep:      Treatment Plan Summary: Daily contact with patient to assess and evaluate symptoms and progress in treatment, Medication management and Plan Patient seems pretty much unchanged from yesterday. Not obviously  psychotic but poor insight and a little bit irritable. Still seems pretty physically sick. No change to medication for today. Continue daily follow-up.  Disposition: Supportive therapy provided about ongoing stressors.  Alethia Berthold, MD 05/31/2015 4:56 PM

## 2015-05-31 NOTE — Progress Notes (Signed)
[employer] [facility] [place] [facility]   PATIENT NAME: Candace Woods    MR#:  [id number]  DATE OF BIRTH:  07-Nov-1940  SUBJECTIVE:   Sodium still 123 this a.m. despite fluid restriction and salt tablets. Nephrology recommends initiate furosemide.  Patient still has periods of tachycardia which resolves spontaneously. Mental status has improved. Shortness of breath and respiratory distress improved, minimal cough with clear sputum production  REVIEW OF SYSTEMS:    Review of Systems  Unable to perform ROS Constitutional: Negative for fever, chills and weight loss.  HENT: Negative for congestion.   Eyes: Negative for blurred vision and double vision.  Respiratory: Positive for cough, sputum production and wheezing. Negative for shortness of breath.   Cardiovascular: Negative for chest pain, palpitations, orthopnea, leg swelling and PND.  Gastrointestinal: Negative for nausea, vomiting, abdominal pain, diarrhea, constipation and blood in stool.  Genitourinary: Negative for dysuria, urgency, frequency and hematuria.  Musculoskeletal: Negative for falls.  Neurological: Negative for dizziness, tremors, focal weakness and headaches.  Endo/Heme/Allergies: Does not bruise/bleed easily.  Psychiatric/Behavioral: Negative for depression. The patient does not have insomnia.     Nutrition: Regular Tolerating Diet: Yes Tolerating PT: Evaluation noted   DRUG ALLERGIES:   Allergies  Allergen Reactions  . Aspirin   . Codeine   . Contrast Media [Iodinated Diagnostic Agents] Hives    Hives with contrast media injections  . Penicillins   . Sulfa Antibiotics     VITALS:  Blood pressure 119/55, pulse 99, temperature 98.8 F (37.1 C), temperature source Oral, resp. rate 17, height  (1.626 m), weight 71.668 kg (158 lb), SpO2 93 %.  PHYSICAL EXAMINATION:   Physical Exam  GENERAL:  75 y.o.-year-old patient lying in the bed in no acute distress.  EYES: Pupils equal, round,  reactive to light and accommodation. No scleral icterus. Extraocular muscles intact.  HEENT: Head atraumatic, normocephalic. Oropharynx and nasopharynx clear.  NECK:  Supple, no jugular venous distention. No thyroid enlargement, no tenderness.  LUNGS: Diffuse end expiratory wheezing, rhonchi bilaterally. No use of accessory muscles, no dullness to percussion.  CARDIOVASCULAR: S1, S2 normal. No murmurs, rubs, or gallops.  ABDOMEN: Soft, nontender, nondistended. Bowel sounds present. No organomegaly or mass.  EXTREMITIES: No cyanosis, clubbing or edema b/l.    NEUROLOGIC: Cranial nerves II through XII are intact. No focal Motor or sensory deficits b/l. Globally weak, positive torticollis PSYCHIATRIC: The patient is alert and oriented x 2.  SKIN: No obvious rash, lesion, or ulcer.    LABORATORY PANEL:   CBC  Recent Labs Lab 05/27/15 0611  WBC 10.9  HGB 14.9  HCT 44.8  PLT 183   ------------------------------------------------------------------------------------------------------------------  Chemistries   Recent Labs Lab 05/31/15 0519  NA 123*  K 4.7  CL 87*  CO2 27  GLUCOSE 94  BUN 9  CREATININE 0.50  CALCIUM 8.3*  MG 1.8   ------------------------------------------------------------------------------------------------------------------  Cardiac Enzymes No results for input(s): TROPONINI in the last 168 hours. ------------------------------------------------------------------------------------------------------------------  RADIOLOGY:  No results found.   ASSESSMENT AND PLAN:   75 year old female with past medical history of COPD, hypertension, history of seizures, peripheral neuropathy, anxiety/depression, psychogenic polydipsia, hyperlipidemia presented to the hospital due to acute on chronic respiratory failure with COPD exacerbation.  1. COPD Exacerbation - wheezing, bronchospasm improved.  -Was on IV steroids but taken off due to agitation. -Continue duo  nebs, Pulmicort nebs. Improving, better clinically.      2. AMS/Encephalopathy - likely due to UTI/Hyponatremia  - Finished treatment for  UTI and d/c Ceftriaxone. Sodium is 123 today , and mental status is normal, patient is able to answer questions appropriately. -Patient is less confused , improving.  Tapering off Ativan.  Cont. Zyprexa.   3. Hyponatremia-patient has history of psychogenic polydipsia. This is not likely the cause presently.  - started on salt tabs and also fluid restriction as per Nephrologist, today patient is initiated on furosemide. Follow Sodium in the morning   4. Urinary tract infection-secondary to Escherichia coli.  -Finished treatment with antibiotics.  5. History of seizures-continue Dilantin and will switch to Oral.   6. Hypokalemia/hypomagnesemia-potassium improved with supplementation. - Mg. Still low . Yesterday, the patient was given another dose of Mg. Sulfate . Yesterday, level has improved.   7. Urinary retention- Foley catheter was discontinued yesterday. Continue Flomax. Checking postvoid residual closely, however the patient denies any urinary symptoms.  PT eval noted and likely will need SNF when stable, hopefully tomorrow, provided sodium level improves with current therapy.   All the records are reviewed and case discussed with Care Management/Social Workerr. Management plans discussed with the patient, family and they are in agreement.  CODE STATUS: DO NOT RESUSCITATE  DVT Prophylaxis: Lovenox  TOTAL TIME TAKING CARE OF THIS PATIENT: 35 minutes.   POSSIBLE D/C IN 2-3 DAYS, DEPENDING ON CLINICAL CONDITION.   Katharina CaperVAICKUTE,Melanye Hiraldo M.D on 05/31/2015 at 12:37 PM  Between 7am to 6pm - Pager - [phone]  After 6pm go to [email or web address] - password [email or web address] [facility]  [facility]  Office  [phone]  CC: Primary care physician; Bobbye MortonSHARON A REILLY, MD

## 2015-05-31 NOTE — Progress Notes (Signed)
Zofran 4mg iv given for complaints of nausea, will monitor. 

## 2015-05-31 NOTE — Progress Notes (Signed)
Temp=102.8 on rechecked. HR=119, Dr. Anne HahnWillis notified with a new order for Tylenol 325 mg x 1 dose, U/A and urine culture as well as CXR. DR. Anne HahnWillis wanted us to try Ibuprofen but patient stated she doesn't take Ibuprofen. Will continue to monitor.

## 2015-05-31 NOTE — Progress Notes (Signed)
[facility]  ROUNDING NOTE   Subjective:   Na 123  Objective:  Vital signs in last 24 hours:  Temp:  [98 F (36.7 C)-100.6 F (38.1 C)] 98.8 F (37.1 C) (04/18 0848) Pulse Rate:  [87-123] 99 (04/18 0848) Resp:  [16-20] 17 (04/18 0848) BP: (119-175)/(55-82) 119/55 mmHg (04/18 0848) SpO2:  [93 %-97 %] 93 % (04/18 0848)  Weight change:  Filed Weights   05/22/15 0810 05/22/15 1321 05/26/15 1428  Weight: 75.297 kg (166 lb) 82 kg (180 lb 12.4 oz) 71.668 kg (158 lb)    Intake/Output: I/O last 3 completed shifts: In: 2303 [P.O.:600; I.V.:3; Other:1600; IV Piggyback:100] Out: 750 [Urine:750]   Intake/Output this shift:  Total I/O In: 123 [P.O.:120; I.V.:3] Out: -   Physical Exam: General: No acute distress  Head: Normocephalic, atraumatic. Dry oral mucosal membranes  Eyes: Anicteric  Neck: +torticollis  Lungs:  Rales on the right  Heart: S1S2 no rubs, irregular tachycardic  Abdomen:  Soft, nontender, BS present   Extremities: no peripheral edema.  Neurologic: Nonfocal, moving all four extremities, Able to follow simple commands   Skin: No lesions  Psych mumbles    Basic Metabolic Panel:  Recent Labs Lab 05/27/15 0611 05/28/15 0658 05/29/15 0809 05/30/15 0412 05/31/15 0519  NA 130* 126* 126* 125* 123*  K 3.4* 2.8* 4.0 4.4 4.7  CL 89* 85* 87* 87* 87*  CO2 30 32 31 33* 27  GLUCOSE 128* 131* 183* 144* 94  BUN CREATININE 0.45 0.40* 0.47 0.50 0.50  CALCIUM 9.1 8.6* 8.6* 8.2* 8.3*  MG  --   --  1.4* 1.6* 1.8    Liver Function Tests: No results for input(s): AST, ALT, ALKPHOS, BILITOT, PROT, ALBUMIN in the last 168 hours. No results for input(s): LIPASE, AMYLASE in the last 168 hours. No results for input(s): AMMONIA in the last 168 hours.  CBC:  Recent Labs Lab 05/27/15 0611  WBC 10.9  HGB 14.9  HCT 44.8  MCV 90.3  PLT 183    Cardiac Enzymes: No results for input(s): CKTOTAL, CKMB, CKMBINDEX, TROPONINI in the last 168  hours.  BNP: Invalid input(s): POCBNP  CBG: No results for input(s): GLUCAP in the last 168 hours.  Microbiology: Results for orders placed or performed during the hospital encounter of 05/22/15  Rapid Influenza A&B Antigens ([facility] only)     Status: None   Collection Time: 05/22/15  8:44 AM  Result Value Ref Range Status   Influenza A ([facility]) NEGATIVE NEGATIVE Final   Influenza B ([facility]) NEGATIVE NEGATIVE Final  Blood culture (routine x 2)     Status: None   Collection Time: 05/22/15  8:44 AM  Result Value Ref Range Status   Specimen Description BLOOD LEFT HAND  Final   Special Requests BOTTLES DRAWN AEROBIC AND ANAEROBIC  1CC  Final   Culture NO GROWTH 8 DAYS  Final   Report Status 05/30/2015 FINAL  Final  Blood culture (routine x 2)     Status: None   Collection Time: 05/22/15  8:44 AM  Result Value Ref Range Status   Specimen Description BLOOD LEFT WRIST  Final   Special Requests BOTTLES DRAWN AEROBIC AND ANAEROBIC  1CC  Final   Culture NO GROWTH 8 DAYS  Final   Report Status 05/30/2015 FINAL  Final  Urine culture     Status: Abnormal   Collection Time: 05/22/15 11:31 AM  Result Value Ref Range Status   Specimen Description URINE,  RANDOM  Final   Special Requests NONE  Final   Culture >=100,000 COLONIES/mL ESCHERICHIA COLI (A)  Final   Report Status 05/24/2015 FINAL  Final   Organism ID, Bacteria ESCHERICHIA COLI (A)  Final      Susceptibility   Escherichia coli - MIC*    AMPICILLIN >=32 RESISTANT Resistant     CEFAZOLIN 8 SENSITIVE Sensitive     CEFTRIAXONE <=1 SENSITIVE Sensitive     CIPROFLOXACIN >=4 RESISTANT Resistant     GENTAMICIN <=1 SENSITIVE Sensitive     IMIPENEM <=0.25 SENSITIVE Sensitive     NITROFURANTOIN <=16 SENSITIVE Sensitive     TRIMETH/SULFA <=20 SENSITIVE Sensitive     AMPICILLIN/SULBACTAM 4 SENSITIVE Sensitive     PIP/TAZO <=4 SENSITIVE Sensitive     Extended ESBL NEGATIVE Sensitive     * >=100,000 COLONIES/mL ESCHERICHIA COLI  MRSA PCR  Screening     Status: None   Collection Time: 05/22/15  1:19 PM  Result Value Ref Range Status   MRSA by PCR NEGATIVE NEGATIVE Final    Comment:        The GeneXpert MRSA Assay (FDA approved for NASAL specimens only), is one component of a comprehensive MRSA colonization surveillance program. It is not intended to diagnose MRSA infection nor to guide or monitor treatment for MRSA infections.     Coagulation Studies: No results for input(s): LABPROT, INR in the last 72 hours.  Urinalysis: No results for input(s): COLORURINE, LABSPEC, PHURINE, GLUCOSEU, HGBUR, BILIRUBINUR, KETONESUR, PROTEINUR, UROBILINOGEN, NITRITE, LEUKOCYTESUR in the last 72 hours.  Invalid input(s): APPERANCEUR    Imaging: No results found.   Medications:   . sodium chloride (hypertonic) 30 mL/hr (05/31/15 0826)   . antiseptic oral rinse  7 mL Mouth Rinse BID  . budesonide (PULMICORT) nebulizer solution  0.25 mg Nebulization BID  . enoxaparin (LOVENOX) injection  40 mg Subcutaneous Q24H  . feeding supplement (ENSURE ENLIVE)  237 mL Oral BID BM  . furosemide  20 mg Oral Daily  . ipratropium-albuterol  3 mL Nebulization Q6H  . LORazepam  1 mg Intravenous BID  . metoprolol tartrate  25 mg Oral BID  . OLANZapine zydis  5 mg Oral QHS  . phenytoin  300 mg Oral QHS  . potassium chloride  20 mEq Oral BID  . sodium chloride flush  3 mL Intravenous Q12H  . sodium chloride  1 g Oral BID WC  . tamsulosin  0.4 mg Oral Daily   acetaminophen **OR** [DISCONTINUED] acetaminophen, albuterol, haloperidol lactate, hydrALAZINE, metoprolol, ondansetron **OR** ondansetron (ZOFRAN) IV, sodium chloride flush  Assessment/ Plan:  75 y.o. female with past medical history of COPD, hypertension, seizure disorder, peripheral neuropathy, anxiety, depression, history of psychogenic polydipsia who presented to [facility] with mechanical fall. Patient known to us from prior admissions as we saw her for  hyponatremia in 2014.  1.  Hyponatremia, with prior hx of psychogenic polydipsia: sodium 123.  - fluid restriction  - salt tabs - Start furosemide   2. Hypokalemia: now resolved. K 4.7 - PO potassium replacement - monitor closely  3.  Urinary Tract Infection: E Coli - ceftriaxone.   4. Acute exacerbation of COPD:  - supportive care.     LOS: 9 Verne Lanuza 4/18/201711:28 AM

## 2015-05-31 NOTE — Care Management Important Message (Signed)
Important Message  Patient Details  Name: Loraine GripDonna M Hohn MRN: [id number] Date of Birth: 30-Nov-1940   Medicare Important Message Given:  Yes    Olegario MessierKathy A Dru Laurel 05/31/2015, 10:32 AM

## 2015-06-01 LAB — CBC
HEMATOCRIT: 39.1 % (ref 35.0–47.0)
HEMOGLOBIN: 13 g/dL (ref 12.0–16.0)
MCH: 29.4 pg (ref 26.0–34.0)
MCHC: 33.3 g/dL (ref 32.0–36.0)
MCV: 88.1 fL (ref 80.0–100.0)
Platelets: 150 10*3/uL (ref 150–440)
RBC: 4.44 MIL/uL (ref 3.80–5.20)
RDW: 16.1 % — ABNORMAL HIGH (ref 11.5–14.5)
WBC: 5.7 10*3/uL (ref 3.6–11.0)

## 2015-06-01 LAB — RENAL FUNCTION PANEL
ANION GAP: 9 (ref 5–15)
Albumin: 2.8 g/dL — ABNORMAL LOW (ref 3.5–5.0)
BUN: 15 mg/dL (ref 6–20)
CHLORIDE: 87 mmol/L — AB (ref 101–111)
CO2: 29 mmol/L (ref 22–32)
Calcium: 8.4 mg/dL — ABNORMAL LOW (ref 8.9–10.3)
Creatinine, Ser: 0.75 mg/dL (ref 0.44–1.00)
Glucose, Bld: 95 mg/dL (ref 65–99)
POTASSIUM: 4.9 mmol/L (ref 3.5–5.1)
Phosphorus: 4.4 mg/dL (ref 2.5–4.6)
Sodium: 125 mmol/L — ABNORMAL LOW (ref 135–145)

## 2015-06-01 LAB — URINALYSIS COMPLETE WITH MICROSCOPIC (ARMC ONLY)
BACTERIA UA: NONE SEEN
Bilirubin Urine: NEGATIVE
Glucose, UA: NEGATIVE mg/dL
Ketones, ur: NEGATIVE mg/dL
Leukocytes, UA: NEGATIVE
Nitrite: NEGATIVE
PH: 7 (ref 5.0–8.0)
PROTEIN: NEGATIVE mg/dL
SPECIFIC GRAVITY, URINE: 1.009 (ref 1.005–1.030)

## 2015-06-01 MED ORDER — DEXTROSE 5 % IV SOLN
2.0000 g | Freq: Two times a day (BID) | INTRAVENOUS | Status: DC
  Administered 2015-06-01 – 2015-06-04 (×8): 2 g via INTRAVENOUS
  Filled 2015-06-01 (×9): qty 2

## 2015-06-01 MED ORDER — POTASSIUM CHLORIDE CRYS ER 20 MEQ PO TBCR
20.0000 meq | EXTENDED_RELEASE_TABLET | Freq: Every day | ORAL | Status: DC
  Administered 2015-06-02 – 2015-06-04 (×3): 20 meq via ORAL
  Filled 2015-06-01 (×3): qty 1

## 2015-06-01 NOTE — Consult Note (Addendum)
[facility] Consult   Reason for Consult:  Follow-up note for this 75 year old woman with a history of chronic mental health problems as well as acute delirium. Patient interviewed chart reviewed labs reviewed. Referring Physician:  Hour Patient Identification: Candace Woods MRN:  [id number] Principal Diagnosis: Acute delirium Diagnosis:   Patient Active Problem List   Diagnosis Date Noted  . Acute delirium [R41.0] 05/26/2015  . 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 Seizures ([place]) [R56.9] 05/26/2015  . Psychosis [F29] 05/26/2015  . Acute on chronic respiratory failure with hypoxia (HCC) [J96.21] 05/22/2015    Total Time spent with patient: 20 minutes  Subjective:   Candace Woods is a 75 y.o. female patient admitted with "I think I'm fine".   Updated note as of Wednesday the 19th. Patient interviewed. Chart reviewed. I also spoke with a representative of [facility] program who has been working with the patient outside the hospital to help her maintain independence. On interview today the patient was awake and alert and oriented to her basic situation although she seemed to have little understanding of her medical situation. She was able to tell me that she had been living independently at home and was able to describe her daily routine and the help she needed in an appropriate way. She indicates that her goal is to get back to that level of independent functioning. She knows that she needs to be able to get up out of bed and improve her strength to do that. Patient denied feeling depressed did not show signs of psychosis and denied any suicidal ideation.  HPI:  Patient interviewed. Chart reviewed. Patient was awake in her room when we came by. Somewhat cooperative. Somewhat irritable but denied any psychiatric problems. Said that she thought her mood was fine. Denied hallucinations. She is a little bit confused. Although she knows she is in the hospital she still  doesn't know how long she has been here and couldn't really describe why she was in the hospital. She tells me several times that she doesn't mess with psychiatrists although I know that she's had chronic mental health problems in the past.  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 Earlier in her hospitalization she was delirious. Responded initially with pulling out of the catatonia and now seems to be getting a little better with appropriate medicine.  Risk to Self: Is patient at risk for suicide?: No Risk to Others:   Prior Inpatient Therapy:   Prior Outpatient Therapy:    Past Medical History:  Past Medical History  Diagnosis Date  . COPD (chronic obstructive pulmonary disease) ([place])   . Hypertension   . Seizures ([place])   . Peripheral neuropathy ([place])   . Torticollis Right  . Anxiety   . Depression   . Psychogenic polydipsia   . MRSA pneumonia ([place])     2014  . Hyperlipemia   . Asthma     Past Surgical History  Procedure Laterality Date  . Abdominal hysterectomy     Family History:  Family History  Problem Relation Age of Onset  . Hypertension Other    Family Psychiatric  History: None reported by her Social History:  History  Alcohol Use No     History  Drug Use No    Social History   Social History  . Marital Status: Widowed    Spouse Name: N/A  . Number of Children: N/A  . Years of Education: N/A   Social History  Main Topics  . Smoking status: Current Every Day Smoker -- 2.00 packs/day for 55 years    Types: Cigarettes  . Smokeless tobacco: None  . Alcohol Use: No  . Drug Use: No  . Sexual Activity: Not Asked   Other Topics Concern  . None   Social History Narrative   Additional Social History:    Allergies:   Allergies  Allergen Reactions  . Aspirin   . Codeine   . Contrast Media [Iodinated Diagnostic Agents] Hives    Hives with contrast media injections  . Penicillins   . Sulfa  Antibiotics     Labs:  Results for orders placed or performed during the hospital encounter of 05/22/15 (from the past 48 hour(s))  Basic metabolic panel     Status: Abnormal   Collection Time: 05/31/15  5:19 AM  Result Value Ref Range   Sodium 123 (L) 135 - 145 mmol/L   Potassium 4.7 3.5 - 5.1 mmol/L   Chloride 87 (L) 101 - 111 mmol/L   CO2 27 22 - 32 mmol/L   Glucose, Bld 94 65 - 99 mg/dL   BUN 9 6 - 20 mg/dL   Creatinine, Ser 0.50 0.44 - 1.00 mg/dL   Calcium 8.3 (L) 8.9 - 10.3 mg/dL   GFR calc non Af Amer >60 >60 mL/min   GFR calc Af Amer >60 >60 mL/min    Comment: (NOTE) The eGFR has been calculated using the CKD EPI equation. This calculation has not been validated in all clinical situations. eGFR's persistently <60 mL/min signify possible Chronic Kidney Disease.    Anion gap 9 5 - 15  Magnesium     Status: None   Collection Time: 05/31/15  5:19 AM  Result Value Ref Range   Magnesium 1.8 1.7 - 2.4 mg/dL  CULTURE, BLOOD (ROUTINE X 2) w Reflex to PCR ID Panel     Status: None (Preliminary result)   Collection Time: 05/31/15  9:08 PM  Result Value Ref Range   Specimen Description BLOOD RIGHT WRIST    Special Requests BOTTLES DRAWN AEROBIC AND ANAEROBIC 5ML    Culture NO GROWTH < 12 HOURS    Report Status PENDING   CULTURE, BLOOD (ROUTINE X 2) w Reflex to PCR ID Panel     Status: None (Preliminary result)   Collection Time: 05/31/15  9:10 PM  Result Value Ref Range   Specimen Description BLOOD RIGHT HAND    Special Requests BOTTLES DRAWN AEROBIC AND ANAEROBIC 5ML    Culture NO GROWTH < 12 HOURS    Report Status PENDING   Urinalysis complete, with microscopic ([facility] only)     Status: Abnormal   Collection Time: 06/01/15 12:59 AM  Result Value Ref Range   Color, Urine YELLOW (A) YELLOW   APPearance CLEAR (A) CLEAR   Glucose, UA NEGATIVE NEGATIVE mg/dL   Bilirubin Urine NEGATIVE NEGATIVE   Ketones, ur NEGATIVE NEGATIVE mg/dL   Specific Gravity, Urine 1.009 1.005 -  1.030   Hgb urine dipstick 2+ (A) NEGATIVE   pH 7.0 5.0 - 8.0   Protein, ur NEGATIVE NEGATIVE mg/dL   Nitrite NEGATIVE NEGATIVE   Leukocytes, UA NEGATIVE NEGATIVE   RBC / HPF 6-30 0 - 5 RBC/hpf   WBC, UA 0-5 0 - 5 WBC/hpf   Bacteria, UA NONE SEEN NONE SEEN   Squamous Epithelial / LPF 0-5 (A) NONE SEEN   Mucous PRESENT    Hyaline Casts, UA PRESENT   Renal function panel  Status: Abnormal   Collection Time: 06/01/15  5:14 AM  Result Value Ref Range   Sodium 125 (L) 135 - 145 mmol/L   Potassium 4.9 3.5 - 5.1 mmol/L   Chloride 87 (L) 101 - 111 mmol/L   CO2 29 22 - 32 mmol/L   Glucose, Bld 95 65 - 99 mg/dL   BUN 15 6 - 20 mg/dL   Creatinine, Ser 0.75 0.44 - 1.00 mg/dL   Calcium 8.4 (L) 8.9 - 10.3 mg/dL   Phosphorus 4.4 2.5 - 4.6 mg/dL   Albumin 2.8 (L) 3.5 - 5.0 g/dL   GFR calc non Af Amer >60 >60 mL/min   GFR calc Af Amer >60 >60 mL/min    Comment: (NOTE) The eGFR has been calculated using the CKD EPI equation. This calculation has not been validated in all clinical situations. eGFR's persistently <60 mL/min signify possible Chronic Kidney Disease.    Anion gap 9 5 - 15  CBC     Status: Abnormal   Collection Time: 06/01/15  5:14 AM  Result Value Ref Range   WBC 5.7 3.6 - 11.0 K/uL   RBC 4.44 3.80 - 5.20 MIL/uL   Hemoglobin 13.0 12.0 - 16.0 g/dL   HCT 39.1 35.0 - 47.0 %   MCV 88.1 80.0 - 100.0 fL   MCH 29.4 26.0 - 34.0 pg   MCHC 33.3 32.0 - 36.0 g/dL   RDW 16.1 (H) 11.5 - 14.5 %   Platelets 150 150 - 440 K/uL    Current Facility-Administered Medications  Medication Dose Route Frequency Provider Last Rate Last Dose  . acetaminophen (TYLENOL) tablet 650 mg  650 mg Oral Q6H PRN Lytle Butte, MD   650 mg at 06/01/15 3474  . albuterol (PROVENTIL) (2.5 MG/3ML) 0.083% nebulizer solution 2.5 mg  2.5 mg Nebulization Q3H PRN Wilhelmina Mcardle, MD      . antiseptic oral rinse (CPC / CETYLPYRIDINIUM CHLORIDE 0.05%) solution 7 mL  7 mL Mouth Rinse BID Wilhelmina Mcardle, MD   7 mL at  06/01/15 1000  . budesonide (PULMICORT) nebulizer solution 0.25 mg  0.25 mg Nebulization BID Wilhelmina Mcardle, MD   0.25 mg at 06/01/15 0754  . ceFEPIme (MAXIPIME) 2 g in dextrose 5 % 50 mL IVPB  2 g Intravenous Q12H Lance Coon, MD   2 g at 06/01/15 1310  . enoxaparin (LOVENOX) injection 40 mg  40 mg Subcutaneous Q24H Wilhelmina Mcardle, MD   40 mg at 06/01/15 0839  . feeding supplement (ENSURE ENLIVE) (ENSURE ENLIVE) liquid 237 mL  237 mL Oral BID BM Harmeet Singh, MD   237 mL at 06/01/15 1310  . furosemide (LASIX) tablet 20 mg  20 mg Oral Daily Lavonia Dana, MD   20 mg at 06/01/15 0915  . haloperidol lactate (HALDOL) injection 1-2 mg  1-2 mg Intravenous Q3H PRN Wilhelmina Mcardle, MD      . hydrALAZINE (APRESOLINE) injection 10-40 mg  10-40 mg Intravenous Q4H PRN Wilhelmina Mcardle, MD   40 mg at 05/26/15 1216  . ipratropium-albuterol (DUONEB) 0.5-2.5 (3) MG/3ML nebulizer solution 3 mL  3 mL Nebulization Q6H Wilhelmina Mcardle, MD   3 mL at 06/01/15 1315  . LORazepam (ATIVAN) injection 1 mg  1 mg Intravenous BID Henreitta Leber, MD   1 mg at 06/01/15 0915  . metoprolol (LOPRESSOR) injection 2.5-5 mg  2.5-5 mg Intravenous Q3H PRN Wilhelmina Mcardle, MD   5 mg at 05/26/15 2595  . metoprolol  tartrate (LOPRESSOR) tablet 25 mg  25 mg Oral Q6H Max Sane, MD   25 mg at 06/01/15 7858  . OLANZapine zydis (ZYPREXA) disintegrating tablet 5 mg  5 mg Oral QHS Gonzella Lex, MD   5 mg at 05/31/15 2120  . ondansetron (ZOFRAN) tablet 4 mg  4 mg Oral Q6H PRN Lytle Butte, MD       Or  . ondansetron [facility]) injection 4 mg  4 mg Intravenous Q6H PRN Lytle Butte, MD   4 mg at 06/01/15 0943  . phenytoin (DILANTIN) ER capsule 300 mg  300 mg Oral QHS Henreitta Leber, MD   300 mg at 05/31/15 2120  . [START ON 06/02/2015] potassium chloride SA (K-DUR,KLOR-CON) CR tablet 20 mEq  20 mEq Oral Daily Vipul Shah, MD      . sodium chloride flush (NS) 0.9 % injection 10-40 mL  10-40 mL Intracatheter PRN Dustin Flock, MD   10 mL at  05/28/15 0837  . sodium chloride flush (NS) 0.9 % injection 3 mL  3 mL Intravenous Q12H Lytle Butte, MD   3 mL at 06/01/15 0916  . sodium chloride tablet 1 g  1 g Oral BID WC Lavonia Dana, MD   1 g at 06/01/15 1748  . tamsulosin (FLOMAX) capsule 0.4 mg  0.4 mg Oral Daily Henreitta Leber, MD   0.4 mg at 06/01/15 0915    Musculoskeletal: Strength & Muscle Tone: decreased Gait & Station: unable to stand Patient leans: N/A  Psychiatric Specialty Exam: Review of Systems  Constitutional: Negative.   HENT: Negative.   Eyes: Negative.   Respiratory: Negative.   Cardiovascular: Negative.   Gastrointestinal: Negative.   Musculoskeletal: Positive for myalgias and falls.  Skin: Negative.   Neurological: Negative.   Psychiatric/Behavioral: Negative for depression, suicidal ideas, hallucinations, memory loss and substance abuse. The patient is not nervous/anxious and does not have insomnia.     Blood pressure 107/66, pulse 99, temperature 98.7 F (37.1 C), temperature source Oral, resp. rate 18, height 5' 4"  (1.626 m), weight 71.668 kg (158 lb), SpO2 92 %.Body mass index is 27.11 kg/(m^2).  General Appearance: Disheveled  Eye Contact::  Minimal  Speech:  Slow  Volume:  Decreased  Mood:  Anxious  Affect:  Depressed  Thought Process:  Tangential  Orientation:  Full (Time, Place, and Person)  Thought Content:  Negative  Suicidal Thoughts:  No  Homicidal Thoughts:  No  Memory:  Immediate;   Fair Recent;   Fair Remote;   Fair  Judgement:  Fair  Insight:  Fair  Psychomotor Activity:  Decreased  Concentration:  Fair  Recall:  [employer] of Knowledge:Fair  Language: Fair  Akathisia:  No  Handed:  Right  AIMS (if indicated):     Assets:  Agricultural consultant Housing Social Support  ADL's:  Impaired  Cognition: Impaired,  Mild  Sleep:      Treatment Plan Summary: Daily contact with patient to assess and evaluate symptoms and progress in treatment,  Medication management and Plan Patient appeared more conversant than she had previously. Although she was still rather abrupt and told me near the end of the interview that she was tired of talking she did complete the interview and answer questions appropriately. Denies any suicidal thoughts. Did not make any obviously delusional statements. She has been receiving a low dose of olanzapine to help not only with sleep but with thought disorder and mood. All of that appears to  be improving as her medical condition improves. Encourage patient to focus on eating and drinking well and improving her strength. I will continue to follow-up daily. At this point it appears that the patient may be a good candidate for rehabilitation treatment with the possibility still present that she could return to her level of independent functioning although that remains to be seen.  Disposition: Supportive therapy provided about ongoing stressors.  Alethia Berthold, MD 06/01/2015 7:26 PM

## 2015-06-01 NOTE — Progress Notes (Addendum)
Nutrition Follow-up     INTERVENTION:  -Continue Ensure Enlive po BID, each supplement provides 350 kcal and 20 grams of protein and Magic cup Supplements  NUTRITION DIAGNOSIS:   Inadequate oral intake related to poor appetite as evidenced by meal completion < 50%. Continues but being addressed via supplements, regular diet  GOAL:   Patient will meet greater than or equal to 90% of their needs  MONITOR:   PO intake, Labs, Weight trends  REASON FOR ASSESSMENT:   Consult Poor PO  ASSESSMENT:     Pt remains hyponatremic,fluid restriction started 4/17 and salt tablets; noted pt remains confused at times; pt irritated on visit today. Very short with Clinical research associatewriter. Pt stated she vomited after eating oatmeal this AM and has not wanted to eat anything else. Pt did not want to order a dinner meal for tonight, pt stated she just wanted to get ready for bed (at 3:30 in the afternoon). Pt was agreeable to drink an Ensure  Diet Order:  Diet regular Room service appropriate?: Yes; Fluid consistency:: Thin; Fluid restriction:: 1200 mL Fluid   Energy Intake: recorded po intake 44% of meals on average; po intake improved on average from previous assessment  Skin:  Reviewed, no issues  Last BM:  last BM unknown   Labs: sodium 125, potassium wdl  Meds: lasix, sodium chloride tablets  Height:   Ht Readings from Last 1 Encounters:  05/22/15 5\' 4"  (1.626 m)    Weight:   Wt Readings from Last 1 Encounters:  05/26/15 158 lb (71.668 kg)    BMI:  Body mass index is 27.11 kg/(m^2).  Estimated Nutritional Needs:   Kcal:  1700-2050 kcals   Protein:  65-76 g   Fluid:  >1.5 L  EDUCATION NEEDS:   No education needs identified at this time  Romelle StarcherCate Allenmichael Mcpartlin MS, RD, LDN [phone] Pager  [phone] Weekend/On-Call Pager

## 2015-06-01 NOTE — Progress Notes (Signed)
[employer] [facility] [employer] [facility]   PATIENT NAME: Candace ChaletDonna Frid    MR#:  [id number]  DATE OF BIRTH:  [birth year]  SUBJECTIVE:  High fever to almost 103 last night. Pancultured, now on cefepime. Blood, urine cx negative so far. UA unremarkable. Chest xray showed infiltrates in both lung bases. Sodium still 125 this a.m. On fluid restriction , salt tablets,  furosemide.   Mental status has overall  Improved, denies pain,  discomfort. Shortness of breath and respiratory distress improved, minimal cough with clear sputum production.   REVIEW OF SYSTEMS:    Review of Systems  Unable to perform ROS Constitutional: Negative for fever, chills and weight loss.  HENT: Negative for congestion.   Eyes: Negative for blurred vision and double vision.  Respiratory: Positive for cough, sputum production and wheezing. Negative for shortness of breath.   Cardiovascular: Negative for chest pain, palpitations, orthopnea, leg swelling and PND.  Gastrointestinal: Negative for nausea, vomiting, abdominal pain, diarrhea, constipation and blood in stool.  Genitourinary: Negative for dysuria, urgency, frequency and hematuria.  Musculoskeletal: Negative for falls.  Neurological: Negative for dizziness, tremors, focal weakness and headaches.  Endo/Heme/Allergies: Does not bruise/bleed easily.  Psychiatric/Behavioral: Negative for depression. The patient does not have insomnia.     Nutrition: Regular Tolerating Diet: Yes Tolerating PT: Evaluation noted   DRUG ALLERGIES:   Allergies  Allergen Reactions  . Aspirin   . Codeine   . Contrast Media [Iodinated Diagnostic Agents] Hives    Hives with contrast media injections  . Penicillins   . Sulfa Antibiotics     VITALS:  Blood pressure 107/66, pulse 99, temperature 98.7 F (37.1 C), temperature source Oral, resp. rate 18, height 5\' 4"  (1.626 m), weight 71.668 kg (158 lb), SpO2 92 %.  PHYSICAL EXAMINATION:   Physical Exam  GENERAL:   75 y.o.-year-old patient lying in the bed in no acute distress.  EYES: Pupils equal, round, reactive to light and accommodation. No scleral icterus. Extraocular muscles intact.  HEENT: Head atraumatic, normocephalic. Oropharynx and nasopharynx clear.  NECK:  Supple, no jugular venous distention. No thyroid enlargement, no tenderness.  LUNGS: Diffuse end expiratory wheezing, rhonchi bilaterally. Using accessory muscles, but no dullness to percussion.  CARDIOVASCULAR: S1, S2 normal. No murmurs, rubs, or gallops.  ABDOMEN: Soft, nontender, nondistended. Bowel sounds present. No organomegaly or mass.  EXTREMITIES: No cyanosis, clubbing or edema b/l.    NEUROLOGIC: Cranial nerves II through XII are intact. No focal Motor or sensory deficits b/l. Globally weak, positive torticollis PSYCHIATRIC: The patient is alert and oriented x 2.  SKIN: No obvious rash, lesion, or ulcer.    LABORATORY PANEL:   CBC  Recent Labs Lab 06/01/15 0514  WBC 5.7  HGB 13.0  HCT 39.1  PLT 150   ------------------------------------------------------------------------------------------------------------------  Chemistries   Recent Labs Lab 05/31/15 0519 06/01/15 0514  NA 123* 125*  K 4.7 4.9  CL 87* 87*  CO2 27 29  GLUCOSE 94 95  BUN 9 15  CREATININE 0.50 0.75  CALCIUM 8.3* 8.4*  MG 1.8  --    ------------------------------------------------------------------------------------------------------------------  Cardiac Enzymes No results for input(s): TROPONINI in the last 168 hours. ------------------------------------------------------------------------------------------------------------------  RADIOLOGY:  Dg Chest 1 View  05/31/2015  CLINICAL DATA:  Cough. History of COPD, hypertension, MRSA pneumonia, and asthma. EXAM: CHEST 1 VIEW COMPARISON:  05/26/2015 FINDINGS: Borderline heart size with normal pulmonary vascularity. Hyperinflation may indicate emphysema. Infiltration or atelectasis in both  lung bases, similar to previous study. No blunting  of costophrenic angles. No pneumothorax. Calcification of the aorta. Mediastinal contours appear intact. A catheter is demonstrated in the right arm with tip over the brachial region. IMPRESSION: Persistent infiltration or atelectasis in both lung bases. Emphysematous changes. Electronically Signed   By: Burman Nieves M.D.   On: 05/31/2015 23:08     ASSESSMENT AND PLAN:   75 year old female with past medical history of COPD, hypertension, history of seizures, peripheral neuropathy, anxiety/depression, psychogenic polydipsia, hyperlipidemia presented to the hospital due to acute on chronic respiratory failure with COPD exacerbation.  1. COPD Exacerbation - wheezing, bronchospasm has not  Improved since yesterday and may have worsened.  -Was on IV steroids but taken off due to agitation. -Continue duo nebs, Pulmicort nebs. Get SLP evaluation to r/o aspirations.      2. AMS/Encephalopathy - likely due to UTI/Hyponatremia  - Finished treatment for UTI and d/c Ceftriaxone. Sodium is 125 today , and mental status is better, patient is able to answer questions appropriately. -Patient is less confused , improving.  Tapering off Ativan.  Cont. Zyprexa. Psychiatrist is following, no change of medications.  3. Hyponatremia-patient has history of psychogenic polydipsia. This is not likely the cause presently.  - started on salt tabs and also fluid restriction as per Nephrologist, also  on furosemide. Some better Na level today, follow Sodium in the morning   4. Urinary tract infection-secondary to Escherichia coli.  -Finished treatment with antibiotics. Rechecking cultures  5. History of seizures-continue Dilantin and will switch to Oral.   6. Hypokalemia/hypomagnesemia-potassium improved with supplementation.   7. Urinary retention- Foley catheter was discontinued yesterday. Continue Flomax. Checking postvoid residual closely, however the patient  denies any urinary symptoms.  PT eval noted , patient will likely need SNF placement when stable,  provided sodium level improves with current therapy.   All the records are reviewed and case discussed with Care Management/Social Workerr. Management plans discussed with the patient, family and they are in agreement.  CODE STATUS: DO NOT RESUSCITATE  DVT Prophylaxis: Lovenox  TOTAL TIME TAKING CARE OF THIS PATIENT: 35 minutes.   POSSIBLE D/C IN 3-4  DAYS, DEPENDING ON CLINICAL CONDITION.   Katharina Caper M.D on 06/01/2015 at 5:47 PM  Between 7am to 6pm - Pager - [phone]  After 6pm go to [email or web address] - password [email or web address] [facility]  [facility]  Office  [phone]  CC: Primary care physician; Bobbye Morton, MD

## 2015-06-01 NOTE — Progress Notes (Signed)
Zosyn changed to Cefepime 2 mg IV every 12 hours. U/A result was negative. Patient resting quietly at this time with her eyes closed, respirations even and unlabored.  Cefepime is infusing right now. Will continue to monitor.

## 2015-06-01 NOTE — Progress Notes (Signed)
In  and Out cath done per Dr. Anne HahnWillis order. CXR result show persistent infiltration or atelectasis in both lung bases. Dr. Anne HahnWillis notified with a new order for zosyn IV  To be run. Temp still is now 102, Dr. Anne HahnWillis is aware. Will continue to monitor.

## 2015-06-01 NOTE — Consult Note (Signed)
[facility] Consult   Reason for Consult:  Follow-up note for this 75 year old woman with a history of chronic mental health problems as well as acute delirium. Patient interviewed chart reviewed labs reviewed. Referring Physician:  Hour Patient Identification: Candace Woods MRN:  [id number] Principal Diagnosis: Acute delirium Diagnosis:   Patient Active Problem List   Diagnosis Date Noted  . Acute delirium [R41.0] 05/26/2015  . 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 Seizures ([place]) [R56.9] 05/26/2015  . Psychosis [F29] 05/26/2015  . Acute on chronic respiratory failure with hypoxia (HCC) [J96.21] 05/22/2015    Total Time spent with patient: 20 minutes  Subjective:   Candace Woods is a 75 y.o. female patient admitted with "I think I'm fine".  HPI:  Patient interviewed. Chart reviewed. Patient was awake in her room when we came by. Somewhat cooperative. Somewhat irritable but denied any psychiatric problems. Said that she thought her mood was fine. Denied hallucinations. She is a little bit confused. Although she knows she is in the hospital she still doesn't know how long she has been here and couldn't really describe why she was in the hospital. She tells me several times that she doesn't mess with psychiatrists although I know that she's had chronic mental health problems in the past.  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 Earlier in her hospitalization she was delirious. Responded initially with pulling out of the catatonia and now seems to be getting a little better with appropriate medicine.  Risk to Self: Is patient at risk for suicide?: No Risk to Others:   Prior Inpatient Therapy:   Prior Outpatient Therapy:    Past Medical History:  Past Medical History  Diagnosis Date  . COPD (chronic obstructive pulmonary disease) ([place])   . Hypertension   . Seizures ([place])   . Peripheral neuropathy ([place])   .  Torticollis Right  . Anxiety   . Depression   . Psychogenic polydipsia   . MRSA pneumonia ([place])     2014  . Hyperlipemia   . Asthma     Past Surgical History  Procedure Laterality Date  . Abdominal hysterectomy     Family History:  Family History  Problem Relation Age of Onset  . Hypertension Other    Family Psychiatric  History: None reported by her Social History:  History  Alcohol Use No     History  Drug Use No    Social History   Social History  . Marital Status: Widowed    Spouse Name: N/A  . Number of Children: N/A  . Years of Education: N/A   Social History Main Topics  . Smoking status: Current Every Day Smoker -- 2.00 packs/day for 55 years    Types: Cigarettes  . Smokeless tobacco: None  . Alcohol Use: No  . Drug Use: No  . Sexual Activity: Not Asked   Other Topics Concern  . None   Social History Narrative   Additional Social History:    Allergies:   Allergies  Allergen Reactions  . Aspirin   . Codeine   . Contrast Media [Iodinated Diagnostic Agents] Hives    Hives with contrast media injections  . Penicillins   . Sulfa Antibiotics     Labs:  Results for orders placed or performed during the hospital encounter of 05/22/15 (from the past 48 hour(s))  Basic metabolic panel     Status: Abnormal   Collection Time: 05/31/15  5:19 AM  Result  Value Ref Range   Sodium 123 (L) 135 - 145 mmol/L   Potassium 4.7 3.5 - 5.1 mmol/L   Chloride 87 (L) 101 - 111 mmol/L   CO2 27 22 - 32 mmol/L   Glucose, Bld 94 65 - 99 mg/dL   BUN 9 6 - 20 mg/dL   Creatinine, Ser 0.50 0.44 - 1.00 mg/dL   Calcium 8.3 (L) 8.9 - 10.3 mg/dL   GFR calc non Af Amer >60 >60 mL/min   GFR calc Af Amer >60 >60 mL/min    Comment: (NOTE) The eGFR has been calculated using the CKD EPI equation. This calculation has not been validated in all clinical situations. eGFR's persistently <60 mL/min signify possible Chronic Kidney Disease.    Anion gap 9 5 - 15  Magnesium      Status: None   Collection Time: 05/31/15  5:19 AM  Result Value Ref Range   Magnesium 1.8 1.7 - 2.4 mg/dL  CULTURE, BLOOD (ROUTINE X 2) w Reflex to PCR ID Panel     Status: None (Preliminary result)   Collection Time: 05/31/15  9:08 PM  Result Value Ref Range   Specimen Description BLOOD RIGHT WRIST    Special Requests BOTTLES DRAWN AEROBIC AND ANAEROBIC 5ML    Culture NO GROWTH < 12 HOURS    Report Status PENDING   CULTURE, BLOOD (ROUTINE X 2) w Reflex to PCR ID Panel     Status: None (Preliminary result)   Collection Time: 05/31/15  9:10 PM  Result Value Ref Range   Specimen Description BLOOD RIGHT HAND    Special Requests BOTTLES DRAWN AEROBIC AND ANAEROBIC 5ML    Culture NO GROWTH < 12 HOURS    Report Status PENDING   Urinalysis complete, with microscopic ([facility] only)     Status: Abnormal   Collection Time: 06/01/15 12:59 AM  Result Value Ref Range   Color, Urine YELLOW (A) YELLOW   APPearance CLEAR (A) CLEAR   Glucose, UA NEGATIVE NEGATIVE mg/dL   Bilirubin Urine NEGATIVE NEGATIVE   Ketones, ur NEGATIVE NEGATIVE mg/dL   Specific Gravity, Urine 1.009 1.005 - 1.030   Hgb urine dipstick 2+ (A) NEGATIVE   pH 7.0 5.0 - 8.0   Protein, ur NEGATIVE NEGATIVE mg/dL   Nitrite NEGATIVE NEGATIVE   Leukocytes, UA NEGATIVE NEGATIVE   RBC / HPF 6-30 0 - 5 RBC/hpf   WBC, UA 0-5 0 - 5 WBC/hpf   Bacteria, UA NONE SEEN NONE SEEN   Squamous Epithelial / LPF 0-5 (A) NONE SEEN   Mucous PRESENT    Hyaline Casts, UA PRESENT   Renal function panel     Status: Abnormal   Collection Time: 06/01/15  5:14 AM  Result Value Ref Range   Sodium 125 (L) 135 - 145 mmol/L   Potassium 4.9 3.5 - 5.1 mmol/L   Chloride 87 (L) 101 - 111 mmol/L   CO2 29 22 - 32 mmol/L   Glucose, Bld 95 65 - 99 mg/dL   BUN 15 6 - 20 mg/dL   Creatinine, Ser 0.75 0.44 - 1.00 mg/dL   Calcium 8.4 (L) 8.9 - 10.3 mg/dL   Phosphorus 4.4 2.5 - 4.6 mg/dL   Albumin 2.8 (L) 3.5 - 5.0 g/dL   GFR calc non Af Amer >60 >60 mL/min   GFR  calc Af Amer >60 >60 mL/min    Comment: (NOTE) The eGFR has been calculated using the CKD EPI equation. This calculation has not been validated in all clinical  situations. eGFR's persistently <60 mL/min signify possible Chronic Kidney Disease.    Anion gap 9 5 - 15  CBC     Status: Abnormal   Collection Time: 06/01/15  5:14 AM  Result Value Ref Range   WBC 5.7 3.6 - 11.0 K/uL   RBC 4.44 3.80 - 5.20 MIL/uL   Hemoglobin 13.0 12.0 - 16.0 g/dL   HCT 39.1 35.0 - 47.0 %   MCV 88.1 80.0 - 100.0 fL   MCH 29.4 26.0 - 34.0 pg   MCHC 33.3 32.0 - 36.0 g/dL   RDW 16.1 (H) 11.5 - 14.5 %   Platelets 150 150 - 440 K/uL    Current Facility-Administered Medications  Medication Dose Route Frequency Provider Last Rate Last Dose  . acetaminophen (TYLENOL) tablet 650 mg  650 mg Oral Q6H PRN Lytle Butte, MD   650 mg at 06/01/15 3500  . albuterol (PROVENTIL) (2.5 MG/3ML) 0.083% nebulizer solution 2.5 mg  2.5 mg Nebulization Q3H PRN Wilhelmina Mcardle, MD      . antiseptic oral rinse (CPC / CETYLPYRIDINIUM CHLORIDE 0.05%) solution 7 mL  7 mL Mouth Rinse BID Wilhelmina Mcardle, MD   7 mL at 06/01/15 1000  . budesonide (PULMICORT) nebulizer solution 0.25 mg  0.25 mg Nebulization BID Wilhelmina Mcardle, MD   0.25 mg at 06/01/15 0754  . ceFEPIme (MAXIPIME) 2 g in dextrose 5 % 50 mL IVPB  2 g Intravenous Q12H Lance Coon, MD   2 g at 06/01/15 1310  . enoxaparin (LOVENOX) injection 40 mg  40 mg Subcutaneous Q24H Wilhelmina Mcardle, MD   40 mg at 06/01/15 0839  . feeding supplement (ENSURE ENLIVE) (ENSURE ENLIVE) liquid 237 mL  237 mL Oral BID BM Harmeet Singh, MD   237 mL at 06/01/15 1310  . furosemide (LASIX) tablet 20 mg  20 mg Oral Daily Lavonia Dana, MD   20 mg at 06/01/15 0915  . haloperidol lactate (HALDOL) injection 1-2 mg  1-2 mg Intravenous Q3H PRN Wilhelmina Mcardle, MD      . hydrALAZINE (APRESOLINE) injection 10-40 mg  10-40 mg Intravenous Q4H PRN Wilhelmina Mcardle, MD   40 mg at 05/26/15 1216  .  ipratropium-albuterol (DUONEB) 0.5-2.5 (3) MG/3ML nebulizer solution 3 mL  3 mL Nebulization Q6H Wilhelmina Mcardle, MD   3 mL at 06/01/15 1315  . LORazepam (ATIVAN) injection 1 mg  1 mg Intravenous BID Henreitta Leber, MD   1 mg at 06/01/15 0915  . metoprolol (LOPRESSOR) injection 2.5-5 mg  2.5-5 mg Intravenous Q3H PRN Wilhelmina Mcardle, MD   5 mg at 05/26/15 9381  . metoprolol tartrate (LOPRESSOR) tablet 25 mg  25 mg Oral Q6H Max Sane, MD   25 mg at 06/01/15 8299  . OLANZapine zydis (ZYPREXA) disintegrating tablet 5 mg  5 mg Oral QHS Gonzella Lex, MD   5 mg at 05/31/15 2120  . ondansetron (ZOFRAN) tablet 4 mg  4 mg Oral Q6H PRN Lytle Butte, MD       Or  . ondansetron [facility]) injection 4 mg  4 mg Intravenous Q6H PRN Lytle Butte, MD   4 mg at 06/01/15 0943  . phenytoin (DILANTIN) ER capsule 300 mg  300 mg Oral QHS Henreitta Leber, MD   300 mg at 05/31/15 2120  . [START ON 06/02/2015] potassium chloride SA (K-DUR,KLOR-CON) CR tablet 20 mEq  20 mEq Oral Daily Max Sane, MD      .  sodium chloride flush (NS) 0.9 % injection 10-40 mL  10-40 mL Intracatheter PRN Dustin Flock, MD   10 mL at 05/28/15 0837  . sodium chloride flush (NS) 0.9 % injection 3 mL  3 mL Intravenous Q12H Lytle Butte, MD   3 mL at 06/01/15 0916  . sodium chloride tablet 1 g  1 g Oral BID WC Lavonia Dana, MD   1 g at 06/01/15 1748  . tamsulosin (FLOMAX) capsule 0.4 mg  0.4 mg Oral Daily Henreitta Leber, MD   0.4 mg at 06/01/15 0915    Musculoskeletal: Strength & Muscle Tone: decreased Gait & Station: unable to stand Patient leans: N/A  Psychiatric Specialty Exam: Review of Systems  Constitutional: Negative.   HENT: Negative.   Eyes: Negative.   Respiratory: Negative.   Cardiovascular: Negative.   Gastrointestinal: Negative.   Musculoskeletal: Positive for myalgias and falls.  Skin: Negative.   Neurological: Negative.   Psychiatric/Behavioral: Negative for depression, suicidal ideas, hallucinations, memory loss  and substance abuse. The patient is not nervous/anxious and does not have insomnia.     Blood pressure 107/66, pulse 99, temperature 98.7 F (37.1 C), temperature source Oral, resp. rate 18, height 5' 4"  (1.626 m), weight 71.668 kg (158 lb), SpO2 92 %.Body mass index is 27.11 kg/(m^2).  General Appearance: Disheveled  Eye Contact::  Minimal  Speech:  Slow  Volume:  Decreased  Mood:  Anxious  Affect:  Depressed  Thought Process:  Tangential  Orientation:  Full (Time, Place, and Person)  Thought Content:  Negative  Suicidal Thoughts:  No  Homicidal Thoughts:  No  Memory:  Immediate;   Fair Recent;   Fair Remote;   Fair  Judgement:  Fair  Insight:  Fair  Psychomotor Activity:  Decreased  Concentration:  Fair  Recall:  [employer] of Knowledge:Fair  Language: Fair  Akathisia:  No  Handed:  Right  AIMS (if indicated):     Assets:  Agricultural consultant Housing Social Support  ADL's:  Impaired  Cognition: Impaired,  Mild  Sleep:      Treatment Plan Summary: Daily contact with patient to assess and evaluate symptoms and progress in treatment, Medication management and Plan Patient seems pretty much unchanged from yesterday. Not obviously psychotic but poor insight and a little bit irritable. Still seems pretty physically sick. No change to medication for today. Continue daily follow-up.  Disposition: Supportive therapy provided about ongoing stressors.  Alethia Berthold, MD 06/01/2015 7:26 PM

## 2015-06-01 NOTE — Progress Notes (Signed)
PT Cancellation Note  Patient Details Name: Candace Woods MRN: [id number] DOB: [birth year]   Cancelled Treatment:    Reason Eval/Treat Not Completed: Patient declined, no reason specified. Treatment attempted. Pt refuses stating she cannot "do therapy" today, but without specific reasons as to why she cannot participate. Encouraged pt offering several options for participation; pt adamantly refuses. Re attempt treatment at a later time/date.    Kristeen MissHeidi Elizabeth Bishop, [place]PTA 06/01/2015, 12:56 PM

## 2015-06-02 LAB — SODIUM
SODIUM: 123 mmol/L — AB (ref 135–145)
SODIUM: 119 mmol/L — AB (ref 135–145)
SODIUM: 122 mmol/L — AB (ref 135–145)
Sodium: 124 mmol/L — ABNORMAL LOW (ref 135–145)

## 2015-06-02 LAB — URINE CULTURE: Culture: NO GROWTH

## 2015-06-02 LAB — INFLUENZA PANEL BY PCR (TYPE A & B)
H1N1 flu by pcr: NOT DETECTED
INFLAPCR: NEGATIVE
INFLBPCR: POSITIVE — AB

## 2015-06-02 MED ORDER — TOLVAPTAN 15 MG PO TABS
15.0000 mg | ORAL_TABLET | Freq: Once | ORAL | Status: AC
  Administered 2015-06-02: 15 mg via ORAL
  Filled 2015-06-02: qty 1

## 2015-06-02 MED ORDER — OSELTAMIVIR PHOSPHATE 75 MG PO CAPS
75.0000 mg | ORAL_CAPSULE | Freq: Two times a day (BID) | ORAL | Status: AC
  Administered 2015-06-02 – 2015-06-06 (×10): 75 mg via ORAL
  Filled 2015-06-02 (×10): qty 1

## 2015-06-02 NOTE — Progress Notes (Signed)
[facility]  ROUNDING NOTE   Subjective:   Na 123  Patient states she is starting to feel better.   Objective:  Vital signs in last 24 hours:  Temp:  [98.7 F (37.1 C)-102.8 F (39.3 C)] 99.1 F (37.3 C) (04/20 0920) Pulse Rate:  [95-117] 95 (04/20 0929) Resp:  [16-20] 20 (04/20 0929) BP: (107-173)/(49-88) 115/61 mmHg (04/20 0929) SpO2:  [90 %-96 %] 90 % (04/20 0929)  Weight change:  Filed Weights   05/22/15 0810 05/22/15 1321 05/26/15 1428  Weight: 75.297 kg (166 lb) 82 kg (180 lb 12.4 oz) 71.668 kg (158 lb)    Intake/Output: I/O last 3 completed shifts: In: 510 [P.O.:460; IV Piggyback:50] Out: 0    Intake/Output this shift:     Physical Exam: General: No acute distress  Head: Normocephalic, atraumatic. Dry oral mucosal membranes  Eyes: Anicteric  Neck: +torticollis  Lungs:  Rales on the right  Heart: S1S2 no rubs, irregular tachycardic  Abdomen:  Soft, nontender, BS present   Extremities: no peripheral edema.  Neurologic: Nonfocal, moving all four extremities, Able to follow commands   Skin: No lesions  Psych mumbles    Basic Metabolic Panel:  Recent Labs Lab 05/28/15 0658 05/29/15 0809 05/30/15 0412 05/31/15 0519 06/01/15 0514 06/02/15 0509  NA 126* 126* 125* 123* 125* 123*  K 2.8* 4.0 4.4 4.7 4.9  --   CL 85* 87* 87* 87* 87*  --   CO2 32 31 33* 27 29  --   GLUCOSE 131* 183* 144* 94 95  --   BUN --   CREATININE 0.40* 0.47 0.50 0.50 0.75  --   CALCIUM 8.6* 8.6* 8.2* 8.3* 8.4*  --   MG  --  1.4* 1.6* 1.8  --   --   PHOS  --   --   --   --  4.4  --     Liver Function Tests:  Recent Labs Lab 06/01/15 0514  ALBUMIN 2.8*   No results for input(s): LIPASE, AMYLASE in the last 168 hours. No results for input(s): AMMONIA in the last 168 hours.  CBC:  Recent Labs Lab 05/27/15 0611 06/01/15 0514  WBC 10.9 5.7  HGB 14.9 13.0  HCT 44.8 39.1  MCV 90.3 88.1  PLT 183 150    Cardiac Enzymes: No results for  input(s): CKTOTAL, CKMB, CKMBINDEX, TROPONINI in the last 168 hours.  BNP: Invalid input(s): POCBNP  CBG: No results for input(s): GLUCAP in the last 168 hours.  Microbiology: Results for orders placed or performed during the hospital encounter of 05/22/15  Rapid Influenza A&B Antigens ([facility] only)     Status: None   Collection Time: 05/22/15  8:44 AM  Result Value Ref Range Status   Influenza A ([facility]) NEGATIVE NEGATIVE Final   Influenza B ([facility]) NEGATIVE NEGATIVE Final  Blood culture (routine x 2)     Status: None   Collection Time: 05/22/15  8:44 AM  Result Value Ref Range Status   Specimen Description BLOOD LEFT HAND  Final   Special Requests BOTTLES DRAWN AEROBIC AND ANAEROBIC  1CC  Final   Culture NO GROWTH 8 DAYS  Final   Report Status 05/30/2015 FINAL  Final  Blood culture (routine x 2)     Status: None   Collection Time: 05/22/15  8:44 AM  Result Value Ref Range Status   Specimen Description BLOOD LEFT WRIST  Final   Special Requests BOTTLES DRAWN AEROBIC AND ANAEROBIC  1CC  Final   Culture NO GROWTH 8 DAYS  Final   Report Status 05/30/2015 FINAL  Final  Urine culture     Status: Abnormal   Collection Time: 05/22/15 11:31 AM  Result Value Ref Range Status   Specimen Description URINE, RANDOM  Final   Special Requests NONE  Final   Culture >=100,000 COLONIES/mL ESCHERICHIA COLI (A)  Final   Report Status 05/24/2015 FINAL  Final   Organism ID, Bacteria ESCHERICHIA COLI (A)  Final      Susceptibility   Escherichia coli - MIC*    AMPICILLIN >=32 RESISTANT Resistant     CEFAZOLIN 8 SENSITIVE Sensitive     CEFTRIAXONE <=1 SENSITIVE Sensitive     CIPROFLOXACIN >=4 RESISTANT Resistant     GENTAMICIN <=1 SENSITIVE Sensitive     IMIPENEM <=0.25 SENSITIVE Sensitive     NITROFURANTOIN <=16 SENSITIVE Sensitive     TRIMETH/SULFA <=20 SENSITIVE Sensitive     AMPICILLIN/SULBACTAM 4 SENSITIVE Sensitive     PIP/TAZO <=4 SENSITIVE Sensitive     Extended ESBL NEGATIVE Sensitive      * >=100,000 COLONIES/mL ESCHERICHIA COLI  MRSA PCR Screening     Status: None   Collection Time: 05/22/15  1:19 PM  Result Value Ref Range Status   MRSA by PCR NEGATIVE NEGATIVE Final    Comment:        The GeneXpert MRSA Assay (FDA approved for NASAL specimens only), is one component of a comprehensive MRSA colonization surveillance program. It is not intended to diagnose MRSA infection nor to guide or monitor treatment for MRSA infections.   CULTURE, BLOOD (ROUTINE X 2) w Reflex to PCR ID Panel     Status: None (Preliminary result)   Collection Time: 05/31/15  9:08 PM  Result Value Ref Range Status   Specimen Description BLOOD RIGHT WRIST  Final   Special Requests BOTTLES DRAWN AEROBIC AND ANAEROBIC 5ML  Final   Culture NO GROWTH < 12 HOURS  Final   Report Status PENDING  Incomplete  CULTURE, BLOOD (ROUTINE X 2) w Reflex to PCR ID Panel     Status: None (Preliminary result)   Collection Time: 05/31/15  9:10 PM  Result Value Ref Range Status   Specimen Description BLOOD RIGHT HAND  Final   Special Requests BOTTLES DRAWN AEROBIC AND ANAEROBIC 5ML  Final   Culture NO GROWTH < 12 HOURS  Final   Report Status PENDING  Incomplete    Coagulation Studies: No results for input(s): LABPROT, INR in the last 72 hours.  Urinalysis:  Recent Labs  06/01/15 0059  COLORURINE YELLOW*  LABSPEC 1.009  PHURINE 7.0  GLUCOSEU NEGATIVE  HGBUR 2+*  BILIRUBINUR NEGATIVE  KETONESUR NEGATIVE  PROTEINUR NEGATIVE  NITRITE NEGATIVE  LEUKOCYTESUR NEGATIVE      Imaging: Dg Chest 1 View  05/31/2015  CLINICAL DATA:  Cough. History of COPD, hypertension, MRSA pneumonia, and asthma. EXAM: CHEST 1 VIEW COMPARISON:  05/26/2015 FINDINGS: Borderline heart size with normal pulmonary vascularity. Hyperinflation may indicate emphysema. Infiltration or atelectasis in both lung bases, similar to previous study. No blunting of costophrenic angles. No pneumothorax. Calcification of the aorta.  Mediastinal contours appear intact. A catheter is demonstrated in the right arm with tip over the brachial region. IMPRESSION: Persistent infiltration or atelectasis in both lung bases. Emphysematous changes. Electronically Signed   By: Burman NievesWilliam  Stevens M.D.   On: 05/31/2015 23:08     Medications:     . antiseptic oral rinse  7 mL Mouth  Rinse BID  . budesonide (PULMICORT) nebulizer solution  0.25 mg Nebulization BID  . ceFEPime (MAXIPIME) IV  2 g Intravenous Q12H  . enoxaparin (LOVENOX) injection  40 mg Subcutaneous Q24H  . feeding supplement (ENSURE ENLIVE)  237 mL Oral BID BM  . furosemide  20 mg Oral Daily  . ipratropium-albuterol  3 mL Nebulization Q6H  . LORazepam  1 mg Intravenous BID  . metoprolol tartrate  25 mg Oral Q6H  . OLANZapine zydis  5 mg Oral QHS  . phenytoin  300 mg Oral QHS  . potassium chloride  20 mEq Oral Daily  . sodium chloride flush  3 mL Intravenous Q12H  . sodium chloride  1 g Oral BID WC  . tamsulosin  0.4 mg Oral Daily  . tolvaptan  15 mg Oral Once   acetaminophen **OR** [DISCONTINUED] acetaminophen, albuterol, haloperidol lactate, hydrALAZINE, metoprolol, ondansetron **OR** ondansetron (ZOFRAN) IV, sodium chloride flush  Assessment/ Plan:  75 y.o. female with past medical history of COPD, hypertension, seizure disorder, peripheral neuropathy, anxiety, depression, history of psychogenic polydipsia who presented to [facility] with mechanical fall. Patient known to [place] from prior admissions as we saw her for hyponatremia in 2014.  1.  Hyponatremia, with prior hx of psychogenic polydipsia: sodium 123.  - fluid restriction  - salt tabs - furosemide  - one dose of tolvaptan  2. Hypokalemia: now resolved. K 4.9 - PO potassium replacement - monitor closely  3.  Urinary Tract Infection: E Coli - cefepime.   4. Acute exacerbation of COPD:  - supportive care.     LOS: 11 Laynie Espy 4/20/201710:41 AM

## 2015-06-02 NOTE — Care Management Important Message (Signed)
Important Message  Patient Details  Name: Candace Woods MRN: [id number] Date of Birth: June 02, 1940   Medicare Important Message Given:  Yes    Olegario MessierKathy A Darreon Lutes 06/02/2015, 11:05 AM

## 2015-06-02 NOTE — Progress Notes (Signed)
Notified Dr. Winona LegatoVaickute of patient's positive PCR flu test. MD stated to start patient on tamiflu 75mg  bid. Will continue to monitor.

## 2015-06-02 NOTE — Clinical Social Work Note (Signed)
Clinical Social Work Assessment  Patient Details  Name: Candace Woods MRN: [id number] Date of Birth: [birth year]  Date of referral:  06/02/15               Reason for consult:  Facility Placement                Permission sought to share information with:  Chartered certified accountant granted to share information::  Yes, Verbal Permission Granted  Name::        Agency::     Relationship::     Contact Information:     Housing/Transportation Living arrangements for the past 2 months:  Single Family Home Source of Information:   ([facility]) Patient Interpreter Needed:  None Criminal Activity/Legal Involvement Pertinent to Current Situation/Hospitalization:  No - Comment as needed Significant Relationships:  Adult Children Lives with:  Self Do you feel safe going back to the place where you live?    Need for family participation in patient care:     Care giving concerns:  Patient lives alone.   Social Worker assessment / plan:  [facility] contacted CSW this afternoon to say that they met with patient's daughter this morning and patient and daughter are now agreeable to STR. [facility] has requested patient be placed at [facility]. [facility] states that they can take patient.   Employment status:  Retired Nurse, adult PT Recommendations:  [place] / Referral to community resources:  [place]  Patient/Family's Response to care:  Patient rather grumpy.  Patient/Family's Understanding of and Emotional Response to Diagnosis, Current Treatment, and Prognosis:  Patient not very insightful about condition.  Emotional Assessment Appearance:  Appears stated age Attitude/Demeanor/Rapport:   (grumpy) Affect (typically observed):  Blunt Orientation:  Oriented to Self, Oriented to Situation, Oriented to Place Alcohol / Substance use:  Not Applicable Psych involvement (Current and /or in the community):  Yes  (Comment)  Discharge Needs  Concerns to be addressed:  Care Coordination Readmission within the last 30 days:  No Current discharge risk:  None Barriers to Discharge:  No Barriers Identified   Shela Leff, LCSW 06/02/2015, 3:54 PM

## 2015-06-02 NOTE — Progress Notes (Signed)
[facility] - [place] at [facility]   PATIENT NAME: Candace Woods    MR#:  [id number]  DATE OF BIRTH:  May 31, 1940  SUBJECTIVE:  High fever to almost 103F yesterday, 06/01/15. Pancultured, no growth, continue cefepime, get influenza test. Blood, urine cx negative so far. UA unremarkable. Chest xray showed infiltrates in both lung bases, similar to prior. Sodium still 119 this a.m. On fluid restriction , salt tablets,  Furosemide, no improvement.   Mental status has overall  Improved, but remains slow and difficult to communicate with, withdrawn. Shortness of breath and respiratory distress improved, minimal cough with clear sputum production. Speech therapist evaluated patient and did not feel that she aspirates liquids. Patient complained of difficulty swallowing big potassium pill.   REVIEW OF SYSTEMS:    Review of Systems  Unable to perform ROS Constitutional: Negative for fever, chills and weight loss.  HENT: Negative for congestion.   Eyes: Negative for blurred vision and double vision.  Respiratory: Positive for cough, sputum production and wheezing. Negative for shortness of breath.   Cardiovascular: Negative for chest pain, palpitations, orthopnea, leg swelling and PND.  Gastrointestinal: Negative for nausea, vomiting, abdominal pain, diarrhea, constipation and blood in stool.  Genitourinary: Negative for dysuria, urgency, frequency and hematuria.  Musculoskeletal: Negative for falls.  Neurological: Negative for dizziness, tremors, focal weakness and headaches.  Endo/Heme/Allergies: Does not bruise/bleed easily.  Psychiatric/Behavioral: Negative for depression. The patient does not have insomnia.     Nutrition: Regular Tolerating Diet: Yes Tolerating PT: Evaluation noted   DRUG ALLERGIES:   Allergies  Allergen Reactions  . Aspirin   . Codeine   . Contrast Media [Iodinated Diagnostic Agents] Hives    Hives with contrast media injections  . Penicillins   .  Sulfa Antibiotics     VITALS:  Blood pressure 116/62, pulse 83, temperature 98.3 F (36.8 C), temperature source Oral, resp. rate 18, height 5\' 4"  (1.626 m), weight 71.668 kg (158 lb), SpO2 90 %.  PHYSICAL EXAMINATION:   Physical Exam  GENERAL:  75 y.o.-year-old patient lying in the bed in no acute distress.  EYES: Pupils equal, round, reactive to light and accommodation. No scleral icterus. Extraocular muscles intact.  HEENT: Head atraumatic, normocephalic. Oropharynx and nasopharynx clear.  NECK:  Supple, no jugular venous distention. No thyroid enlargement, no tenderness.  LUNGS: Diffuse end expiratory wheezing, rhonchi bilaterally. Using accessory muscles, but no dullness to percussion.  CARDIOVASCULAR: S1, S2 normal. No murmurs, rubs, or gallops.  ABDOMEN: Soft, nontender, nondistended. Bowel sounds present. No organomegaly or mass.  EXTREMITIES: No cyanosis, clubbing or edema b/l.    NEUROLOGIC: Cranial nerves II through XII are intact. No focal Motor or sensory deficits b/l. Globally weak, positive torticollis PSYCHIATRIC: The patient is alert and oriented x 2.  SKIN: No obvious rash, lesion, or ulcer.    LABORATORY PANEL:   CBC  Recent Labs Lab 06/01/15 0514  WBC 5.7  HGB 13.0  HCT 39.1  PLT 150   ------------------------------------------------------------------------------------------------------------------  Chemistries   Recent Labs Lab 05/31/15 0519 06/01/15 0514  06/02/15 1050  NA 123* 125*  < > 119*  K 4.7 4.9  --   --   CL 87* 87*  --   --   CO2 27 29  --   --   GLUCOSE 94 95  --   --   BUN 9 15  --   --   CREATININE 0.50 0.75  --   --   CALCIUM 8.3* 8.4*  --   --  MG 1.8  --   --   --   < > = values in this interval not displayed. ------------------------------------------------------------------------------------------------------------------  Cardiac Enzymes No results for input(s): TROPONINI in the last 168  hours. ------------------------------------------------------------------------------------------------------------------  RADIOLOGY:  Dg Chest 1 View  05/31/2015  CLINICAL DATA:  Cough. History of COPD, hypertension, MRSA pneumonia, and asthma. EXAM: CHEST 1 VIEW COMPARISON:  05/26/2015 FINDINGS: Borderline heart size with normal pulmonary vascularity. Hyperinflation may indicate emphysema. Infiltration or atelectasis in both lung bases, similar to previous study. No blunting of costophrenic angles. No pneumothorax. Calcification of the aorta. Mediastinal contours appear intact. A catheter is demonstrated in the right arm with tip over the brachial region. IMPRESSION: Persistent infiltration or atelectasis in both lung bases. Emphysematous changes. Electronically Signed   By: Burman Nieves M.D.   On: 05/31/2015 23:08     ASSESSMENT AND PLAN:   75 year old female with past medical history of COPD, hypertension, history of seizures, peripheral neuropathy, anxiety/depression, psychogenic polydipsia, hyperlipidemia presented to the hospital due to acute on chronic respiratory failure with COPD exacerbation.  1. COPD Exacerbation - wheezing, bronchospasm has been stable since yesterday. Speech therapist evaluated patient and did not feel that she aspirates liquids. Patient will be still seen during lunch and evaluated about swallowing solids.  Appreciate speech therapist involvement, advice.  -Was on IV steroids but taken off due to agitation. -Continue duo nebs, Pulmicort nebs. .      2. AMS/Encephalopathy - likely due to UTI/Hyponatremia  - Finished treatment for UTI and d/c Ceftriaxone. Sodium is 119 today , mental status is stable but patient is relatively slow to respond, somewhat withdrawn,  is able to answer questions appropriately.  Tapering off Ativan.  Cont. Zyprexa. Psychiatrist is following, no change of medications.  3. Hyponatremia-patient has history of psychogenic polydipsia. This  is not likely the cause presently.  - Continue salt tabs , luid restriction , urosemide. We will give a dose of talvaptan,   per nephrologist, follow Sodium in the morning   4. Urinary tract infection-secondary to Escherichia coli.  -Finished treatment with antibiotics. Rechecking cultures, negative so far  5. History of seizures-continue Dilantin Orally, get level in the morning  6. Hypokalemia/hypomagnesemia-potassium improved with supplementation.  7. Urinary retention- Foley catheter was discontinued . Continue Flomax. Checking postvoid residual closely, however the patient denies any urinary symptoms.  PT eval noted , patient will likely need SNF placement when stable,  provided sodium level improves with current therapy, hopefully by tomorrow  All the records are reviewed and case discussed with Care Management/Social Workerr. Management plans discussed with the patient, family and they are in agreement.  CODE STATUS: DO NOT RESUSCITATE  DVT Prophylaxis: Lovenox  TOTAL TIME TAKING CARE OF THIS PATIENT: 35 minutes. Discussed with Dr. Wynelle Link  POSSIBLE D/C IN 3-4  DAYS, DEPENDING ON CLINICAL CONDITION.   Katharina Caper M.D on 06/02/2015 at 11:59 AM  Between 7am to 6pm - Pager - [phone]  After 6pm go to [email or web address] - password [email or web address] [facility]  [facility]  Office  [phone]  CC: Primary care physician; Bobbye Morton, MD

## 2015-06-02 NOTE — Progress Notes (Signed)
Pixis does not contain tolvaptan dose. Pharmacy has been called for dose. Patient's first sodium redraw came back at 119 which is lower than it was this AM. Will give medication as soon as it arrives to the floor. Sodium will be rechecked in less than 6 hours.

## 2015-06-02 NOTE — Consult Note (Signed)
[place] [facility] Consult   Reason for Consult:  Follow-up note for this 75 year old woman with a history of chronic mental health problems as well as acute delirium. Patient interviewed chart reviewed labs reviewed. Referring Physician:  Hour Patient Identification: Candace Woods MRN:  [id number] Principal Diagnosis: Acute delirium Diagnosis:   Patient Active Problem List   Diagnosis Date Noted  . Acute delirium [R41.0] 05/26/2015  . 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 Seizures ([place]) [R56.9] 05/26/2015  . Psychosis [F29] 05/26/2015  . Acute on chronic respiratory failure with hypoxia (HCC) [J96.21] 05/22/2015    Total Time spent with patient: 20 minutes  Subjective:   Candace Woods is a 75 y.o. female patient admitted with "I think I'm fine".    Update Thursday the 20th. Patient has now been diagnosed with the flu. She looks more sick today. She was interactive but at times seemed a little more slow and confused. Nevertheless she was alert and oriented to questioning. Says her mood is feeling down but attributes that to her sickness. Denies suicidal ideation doesn't show signs of hallucinations. Cooperative with treatment.  HPI:  Patient interviewed. Chart reviewed. Patient was awake in her room when we came by. Somewhat cooperative. Somewhat irritable but denied any psychiatric problems. Said that she thought her mood was fine. Denied hallucinations. She is a little bit confused. Although she knows she is in the hospital she still doesn't know how long she has been here and couldn't really describe why she was in the hospital. She tells me several times that she doesn't mess with psychiatrists although I know that she's had chronic mental health problems in the past.  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 Earlier in her hospitalization she was delirious. Responded initially with pulling out of the catatonia and now  seems to be getting a little better with appropriate medicine.  Risk to Self: Is patient at risk for suicide?: No Risk to Others:   Prior Inpatient Therapy:   Prior Outpatient Therapy:    Past Medical History:  Past Medical History  Diagnosis Date  . COPD (chronic obstructive pulmonary disease) ([place])   . Hypertension   . Seizures ([place])   . Peripheral neuropathy ([place])   . Torticollis Right  . Anxiety   . Depression   . Psychogenic polydipsia   . MRSA pneumonia ([place])     2014  . Hyperlipemia   . Asthma     Past Surgical History  Procedure Laterality Date  . Abdominal hysterectomy     Family History:  Family History  Problem Relation Age of Onset  . Hypertension Other    Family Psychiatric  History: None reported by her Social History:  History  Alcohol Use No     History  Drug Use No    Social History   Social History  . Marital Status: Widowed    Spouse Name: N/A  . Number of Children: N/A  . Years of Education: N/A   Social History Main Topics  . Smoking status: Current Every Day Smoker -- 2.00 packs/day for 55 years    Types: Cigarettes  . Smokeless tobacco: None  . Alcohol Use: No  . Drug Use: No  . Sexual Activity: Not Asked   Other Topics Concern  . None   Social History Narrative   Additional Social History:    Allergies:   Allergies  Allergen Reactions  . Aspirin   . Codeine   . Contrast  Media [Iodinated Diagnostic Agents] Hives    Hives with contrast media injections  . Penicillins   . Sulfa Antibiotics     Labs:  Results for orders placed or performed during the hospital encounter of 05/22/15 (from the past 48 hour(s))  CULTURE, BLOOD (ROUTINE X 2) w Reflex to PCR ID Panel     Status: None (Preliminary result)   Collection Time: 05/31/15  9:08 PM  Result Value Ref Range   Specimen Description BLOOD RIGHT WRIST    Special Requests BOTTLES DRAWN AEROBIC AND ANAEROBIC 5ML    Culture NO GROWTH 2 DAYS    Report Status PENDING    CULTURE, BLOOD (ROUTINE X 2) w Reflex to PCR ID Panel     Status: None (Preliminary result)   Collection Time: 05/31/15  9:10 PM  Result Value Ref Range   Specimen Description BLOOD RIGHT HAND    Special Requests BOTTLES DRAWN AEROBIC AND ANAEROBIC 5ML    Culture NO GROWTH 2 DAYS    Report Status PENDING   Urinalysis complete, with microscopic ([facility] only)     Status: Abnormal   Collection Time: 06/01/15 12:59 AM  Result Value Ref Range   Color, Urine YELLOW (A) YELLOW   APPearance CLEAR (A) CLEAR   Glucose, UA NEGATIVE NEGATIVE mg/dL   Bilirubin Urine NEGATIVE NEGATIVE   Ketones, ur NEGATIVE NEGATIVE mg/dL   Specific Gravity, Urine 1.009 1.005 - 1.030   Hgb urine dipstick 2+ (A) NEGATIVE   pH 7.0 5.0 - 8.0   Protein, ur NEGATIVE NEGATIVE mg/dL   Nitrite NEGATIVE NEGATIVE   Leukocytes, UA NEGATIVE NEGATIVE   RBC / HPF 6-30 0 - 5 RBC/hpf   WBC, UA 0-5 0 - 5 WBC/hpf   Bacteria, UA NONE SEEN NONE SEEN   Squamous Epithelial / LPF 0-5 (A) NONE SEEN   Mucous PRESENT    Hyaline Casts, UA PRESENT   Urine culture     Status: None   Collection Time: 06/01/15 12:59 AM  Result Value Ref Range   Specimen Description URINE, RANDOM    Special Requests NONE    Culture NO GROWTH 1 DAY    Report Status 06/02/2015 FINAL   Renal function panel     Status: Abnormal   Collection Time: 06/01/15  5:14 AM  Result Value Ref Range   Sodium 125 (L) 135 - 145 mmol/L   Potassium 4.9 3.5 - 5.1 mmol/L   Chloride 87 (L) 101 - 111 mmol/L   CO2 29 22 - 32 mmol/L   Glucose, Bld 95 65 - 99 mg/dL   BUN 15 6 - 20 mg/dL   Creatinine, Ser 0.75 0.44 - 1.00 mg/dL   Calcium 8.4 (L) 8.9 - 10.3 mg/dL   Phosphorus 4.4 2.5 - 4.6 mg/dL   Albumin 2.8 (L) 3.5 - 5.0 g/dL   GFR calc non Af Amer >60 >60 mL/min   GFR calc Af Amer >60 >60 mL/min    Comment: (NOTE) The eGFR has been calculated using the CKD EPI equation. This calculation has not been validated in all clinical situations. eGFR's persistently <60 mL/min  signify possible Chronic Kidney Disease.    Anion gap 9 5 - 15  CBC     Status: Abnormal   Collection Time: 06/01/15  5:14 AM  Result Value Ref Range   WBC 5.7 3.6 - 11.0 K/uL   RBC 4.44 3.80 - 5.20 MIL/uL   Hemoglobin 13.0 12.0 - 16.0 g/dL   HCT 39.1 35.0 - 47.0 %  MCV 88.1 80.0 - 100.0 fL   MCH 29.4 26.0 - 34.0 pg   MCHC 33.3 32.0 - 36.0 g/dL   RDW 16.1 (H) 11.5 - 14.5 %   Platelets 150 150 - 440 K/uL  Sodium     Status: Abnormal   Collection Time: 06/02/15  5:09 AM  Result Value Ref Range   Sodium 123 (L) 135 - 145 mmol/L  serum sodium     Status: Abnormal   Collection Time: 06/02/15 10:50 AM  Result Value Ref Range   Sodium 119 (LL) 135 - 145 mmol/L    Comment: CRITICAL RESULT CALLED TO, READ BACK BY AND VERIFIED WITH LEAH LEE ON 06/02/15 AT 1119 BY [facility]   Influenza panel by PCR (type A & B, H1N1)     Status: Abnormal   Collection Time: 06/02/15  1:17 PM  Result Value Ref Range   Influenza A By PCR NEGATIVE NEGATIVE   Influenza B By PCR POSITIVE (A) NEGATIVE   H1N1 flu by pcr NOT DETECTED NOT DETECTED    Comment:        The Xpert Flu assay (FDA approved for nasal aspirates or washes and nasopharyngeal swab specimens), is intended as an aid in the diagnosis of influenza and should not be used as a sole basis for treatment.   serum sodium     Status: Abnormal   Collection Time: 06/02/15  4:48 PM  Result Value Ref Range   Sodium 122 (L) 135 - 145 mmol/L    Current Facility-Administered Medications  Medication Dose Route Frequency Provider Last Rate Last Dose  . acetaminophen (TYLENOL) tablet 650 mg  650 mg Oral Q6H PRN Lytle Butte, MD   650 mg at 06/02/15 0934  . albuterol (PROVENTIL) (2.5 MG/3ML) 0.083% nebulizer solution 2.5 mg  2.5 mg Nebulization Q3H PRN Wilhelmina Mcardle, MD      . antiseptic oral rinse (CPC / CETYLPYRIDINIUM CHLORIDE 0.05%) solution 7 mL  7 mL Mouth Rinse BID Wilhelmina Mcardle, MD   7 mL at 06/02/15 0936  . budesonide (PULMICORT) nebulizer  solution 0.25 mg  0.25 mg Nebulization BID Wilhelmina Mcardle, MD   0.25 mg at 06/02/15 0732  . ceFEPIme (MAXIPIME) 2 g in dextrose 5 % 50 mL IVPB  2 g Intravenous Q12H Lance Coon, MD   2 g at 06/02/15 1342  . enoxaparin (LOVENOX) injection 40 mg  40 mg Subcutaneous Q24H Wilhelmina Mcardle, MD   40 mg at 06/02/15 0935  . feeding supplement (ENSURE ENLIVE) (ENSURE ENLIVE) liquid 237 mL  237 mL Oral BID BM Harmeet Singh, MD   237 mL at 06/02/15 1400  . furosemide (LASIX) tablet 20 mg  20 mg Oral Daily Lavonia Dana, MD   20 mg at 06/02/15 0935  . haloperidol lactate (HALDOL) injection 1-2 mg  1-2 mg Intravenous Q3H PRN Wilhelmina Mcardle, MD      . hydrALAZINE (APRESOLINE) injection 10-40 mg  10-40 mg Intravenous Q4H PRN Wilhelmina Mcardle, MD   40 mg at 05/26/15 1216  . ipratropium-albuterol (DUONEB) 0.5-2.5 (3) MG/3ML nebulizer solution 3 mL  3 mL Nebulization Q6H Wilhelmina Mcardle, MD   3 mL at 06/02/15 1328  . LORazepam (ATIVAN) injection 1 mg  1 mg Intravenous BID Henreitta Leber, MD   1 mg at 06/02/15 0935  . metoprolol (LOPRESSOR) injection 2.5-5 mg  2.5-5 mg Intravenous Q3H PRN Wilhelmina Mcardle, MD   5 mg at 05/26/15 1194  . metoprolol  tartrate (LOPRESSOR) tablet 25 mg  25 mg Oral Q6H Vipul Shah, MD   25 mg at 06/02/15 1548  . OLANZapine zydis (ZYPREXA) disintegrating tablet 5 mg  5 mg Oral QHS Gonzella Lex, MD   5 mg at 06/01/15 2226  . ondansetron (ZOFRAN) tablet 4 mg  4 mg Oral Q6H PRN Lytle Butte, MD       Or  . ondansetron [facility]) injection 4 mg  4 mg Intravenous Q6H PRN Lytle Butte, MD   4 mg at 06/01/15 0943  . oseltamivir (TAMIFLU) capsule 75 mg  75 mg Oral BID Theodoro Grist, MD   75 mg at 06/02/15 1548  . phenytoin (DILANTIN) ER capsule 300 mg  300 mg Oral QHS Henreitta Leber, MD   300 mg at 06/01/15 2059  . potassium chloride SA (K-DUR,KLOR-CON) CR tablet 20 mEq  20 mEq Oral Daily Max Sane, MD   20 mEq at 06/02/15 0935  . sodium chloride flush (NS) 0.9 % injection 10-40 mL  10-40 mL  Intracatheter PRN Dustin Flock, MD   10 mL at 05/28/15 0837  . sodium chloride flush (NS) 0.9 % injection 3 mL  3 mL Intravenous Q12H Lytle Butte, MD   3 mL at 06/02/15 0935  . sodium chloride tablet 1 g  1 g Oral BID WC Lavonia Dana, MD   1 g at 06/02/15 1736  . tamsulosin (FLOMAX) capsule 0.4 mg  0.4 mg Oral Daily Henreitta Leber, MD   0.4 mg at 06/02/15 0935    Musculoskeletal: Strength & Muscle Tone: decreased Gait & Station: unable to stand Patient leans: N/A  Psychiatric Specialty Exam: Review of Systems  Constitutional: Negative.   HENT: Negative.   Eyes: Negative.   Respiratory: Negative.   Cardiovascular: Negative.   Gastrointestinal: Negative.   Musculoskeletal: Positive for myalgias and falls.  Skin: Negative.   Neurological: Negative.   Psychiatric/Behavioral: Negative for depression, suicidal ideas, hallucinations, memory loss and substance abuse. The patient is not nervous/anxious and does not have insomnia.     Blood pressure 136/72, pulse 106, temperature 100.2 F (37.9 C), temperature source Oral, resp. rate 24, height 5' 4"  (1.626 m), weight 71.668 kg (158 lb), SpO2 91 %.Body mass index is 27.11 kg/(m^2).  General Appearance: Disheveled  Eye Contact::  Minimal  Speech:  Slow  Volume:  Decreased  Mood:  Anxious  Affect:  Depressed  Thought Process:  Tangential  Orientation:  Full (Time, Place, and Person)  Thought Content:  Negative  Suicidal Thoughts:  No  Homicidal Thoughts:  No  Memory:  Immediate;   Fair Recent;   Fair Remote;   Fair  Judgement:  Fair  Insight:  Fair  Psychomotor Activity:  Decreased  Concentration:  Fair  Recall:  [employer] of Knowledge:Fair  Language: Fair  Akathisia:  No  Handed:  Right  AIMS (if indicated):     Assets:  Agricultural consultant Housing Social Support  ADL's:  Impaired  Cognition: Impaired,  Mild  Sleep:      Treatment Plan Summary: Daily contact with patient to assess  and evaluate symptoms and progress in treatment, Medication management and Plan Psychiatrically she appears to be fairly stable but her sickness is impairing her communication and functioning. No change to medicine today. Continue antipsychotic. Continue to monitor as needed. Patient is still too sick for to be clear how much function she will ultimately regain.  Disposition: Supportive therapy provided about ongoing stressors.  Alethia Berthold, MD 06/02/2015 8:25 PM

## 2015-06-02 NOTE — Progress Notes (Signed)
PT Cancellation Note  Patient Details Name: Candace Woods MRN: [id number] DOB: 1940-09-23   Cancelled Treatment:    Reason Eval/Treat Not Completed: Patient declined, no reason specified. Treatment attempted this morning; pt refuses. Pt states, "I don't like being pestered about it. I don't think there is much sense to it." This is second consecutive refusal, re attempt PT tomorrow; if pt continues to refuse, may need to consider completing current PT order for lack of willful participation.    Kristeen MissHeidi Elizabeth Bishop, [place]PTA 06/02/2015, 12:12 PM

## 2015-06-02 NOTE — [place] FL2 (Signed)
[place] MEDICAID FL2 LEVEL OF CARE SCREENING TOOL     IDENTIFICATION  Patient Name: Candace Woods Birthdate: September 11, 1940 Sex: female Admission Date (Current Location): 05/22/2015  [facility] and [place] Number:  Chiropodist and Address:  [facility], [address], [place], [place] [address]      Provider Number: [id number]  Attending Physician Name and Address:  Katharina Caper, MD  Relative Name and Phone Number:       Current Level of Care: Hospital Recommended Level of Care: Skilled Nursing Facility Prior Approval Number:    Date Approved/Denied:   PASRR Number:    Discharge Plan: SNF    Current Diagnoses: Patient Active Problem List   Diagnosis Date Noted  . Acute delirium 05/26/2015  . 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 Seizures (HCC) 05/26/2015  . Psychosis 05/26/2015  . Acute on chronic respiratory failure with hypoxia (HCC) 05/22/2015    Orientation RESPIRATION BLADDER Height & Weight     Self, Place  Normal Incontinent Weight: 158 lb (71.668 kg) Height:   (162.6 cm)  BEHAVIORAL SYMPTOMS/MOOD NEUROLOGICAL BOWEL NUTRITION STATUS   (none)  (none) Continent Diet (regular)  AMBULATORY STATUS COMMUNICATION OF NEEDS Skin   Extensive Assist Verbally Normal                       Personal Care Assistance Level of Assistance  Bathing, Dressing Bathing Assistance: Limited assistance   Dressing Assistance: Limited assistance     Functional Limitations Info  Sight          SPECIAL CARE FACTORS FREQUENCY  PT (By licensed PT)                    Contractures Contractures Info: Not present    Additional Factors Info  Code Status, Allergies Code Status Info: DNR Allergies Info: asa, codeine, sulfa, contrast           Current Medications (06/02/2015):  This is the current hospital active medication list Current Facility-Administered Medications  Medication Dose Route Frequency Provider Last Rate  Last Dose  . acetaminophen (TYLENOL) tablet 650 mg  650 mg Oral Q6H PRN Wyatt Haste, MD   650 mg at 06/02/15 0934  . albuterol (PROVENTIL) (2.5 MG/3ML) 0.083% nebulizer solution 2.5 mg  2.5 mg Nebulization Q3H PRN Merwyn Katos, MD      . antiseptic oral rinse (CPC / CETYLPYRIDINIUM CHLORIDE 0.05%) solution 7 mL  7 mL Mouth Rinse BID Merwyn Katos, MD   7 mL at 06/02/15 0936  . budesonide (PULMICORT) nebulizer solution 0.25 mg  0.25 mg Nebulization BID Merwyn Katos, MD   0.25 mg at 06/02/15 0732  . ceFEPIme (MAXIPIME) 2 g in dextrose 5 % 50 mL IVPB  2 g Intravenous Q12H Oralia Manis, MD   2 g at 06/02/15 1342  . enoxaparin (LOVENOX) injection 40 mg  40 mg Subcutaneous Q24H Merwyn Katos, MD   40 mg at 06/02/15 0935  . feeding supplement (ENSURE ENLIVE) (ENSURE ENLIVE) liquid 237 mL  237 mL Oral BID BM Harmeet Singh, MD   237 mL at 06/02/15 1400  . furosemide (LASIX) tablet 20 mg  20 mg Oral Daily Lamont Dowdy, MD   20 mg at 06/02/15 0935  . haloperidol lactate (HALDOL) injection 1-2 mg  1-2 mg Intravenous Q3H PRN Merwyn Katos, MD      . hydrALAZINE (APRESOLINE) injection 10-40 mg  10-40 mg Intravenous Q4H PRN  Merwyn Katosavid B Simonds, MD   40 mg at 05/26/15 1216  . ipratropium-albuterol (DUONEB) 0.5-2.5 (3) MG/3ML nebulizer solution 3 mL  3 mL Nebulization Q6H Merwyn Katosavid B Simonds, MD   3 mL at 06/02/15 1328  . LORazepam (ATIVAN) injection 1 mg  1 mg Intravenous BID Houston SirenVivek J Sainani, MD   1 mg at 06/02/15 0935  . metoprolol (LOPRESSOR) injection 2.5-5 mg  2.5-5 mg Intravenous Q3H PRN Merwyn Katosavid B Simonds, MD   5 mg at 05/26/15 [id number]  . metoprolol tartrate (LOPRESSOR) tablet 25 mg  25 mg Oral Q6H Vipul Shah, MD   25 mg at 06/02/15 0935  . OLANZapine zydis (ZYPREXA) disintegrating tablet 5 mg  5 mg Oral QHS Audery AmelJohn T Clapacs, MD   5 mg at 06/01/15 2226  . ondansetron (ZOFRAN) tablet 4 mg  4 mg Oral Q6H PRN Wyatt Hasteavid K Hower, MD       Or  . ondansetron [facility](ZOFRAN) injection 4 mg  4 mg Intravenous Q6H PRN Wyatt Hasteavid K  Hower, MD   4 mg at 06/01/15 0943  . oseltamivir (TAMIFLU) capsule 75 mg  75 mg Oral BID Katharina Caperima Vaickute, MD      . phenytoin (DILANTIN) ER capsule 300 mg  300 mg Oral QHS Houston SirenVivek J Sainani, MD   300 mg at 06/01/15 2059  . potassium chloride SA (K-DUR,KLOR-CON) CR tablet 20 mEq  20 mEq Oral Daily Delfino LovettVipul Shah, MD   20 mEq at 06/02/15 0935  . sodium chloride flush (NS) 0.9 % injection 10-40 mL  10-40 mL Intracatheter PRN Auburn BilberryShreyang Patel, MD   10 mL at 05/28/15 0837  . sodium chloride flush (NS) 0.9 % injection 3 mL  3 mL Intravenous Q12H Wyatt Hasteavid K Hower, MD   3 mL at 06/02/15 0935  . sodium chloride tablet 1 g  1 g Oral BID WC Lamont DowdySarath Kolluru, MD   1 g at 06/02/15 0935  . tamsulosin (FLOMAX) capsule 0.4 mg  0.4 mg Oral Daily Houston SirenVivek J Sainani, MD   0.4 mg at 06/02/15 0935     Discharge Medications: Please see discharge summary for a list of discharge medications.  Relevant Imaging Results:  Relevant Lab Results:   Additional Information SS: [id number]  York SpanielMonica Selisa Tensley, LCSW

## 2015-06-02 NOTE — Evaluation (Signed)
Clinical/Bedside Swallow Evaluation Patient Details  Name: Candace Woods MRN: [id number] Date of Birth: Jun 05, 1940  Today's Date: 06/02/2015 Time: SLP Start Time (ACUTE ONLY): 1000 SLP Stop Time (ACUTE ONLY): 1100 SLP Time Calculation (min) (ACUTE ONLY): 60 min  Past Medical History:  Past Medical History  Diagnosis Date  . COPD (chronic obstructive pulmonary disease) (HCC)   . Hypertension   . Seizures (HCC)   . Peripheral neuropathy (HCC)   . Torticollis Right  . Anxiety   . Depression   . Psychogenic polydipsia   . MRSA pneumonia (HCC)     2014  . Hyperlipemia   . Asthma    Past Surgical History:  Past Surgical History  Procedure Laterality Date  . Abdominal hysterectomy     HPI:  Pt is a 75 y.o. female with a known history of COPD non-oxygen requiring, FTT, protein-calorie malnutrition, chronic mental health issues, seizures, HTN, peripheral neuropathy presenting the hospital after mechanical fall at home unable to rise off floor secondary to weakness. On arrival to emergency department noted to be hypoxic with O2 saturations in the mid 70s on room air and had improvement of oxygenation requiring nonrebreather however given increased work of breathing placed on BiPAP therapy. O2 saturations remained in the high 80s. She complained of nonproductive cough shortness of breath, however, stated this is her baseline. No elevation of temp at admission per chart vitals. During this admission, pt has experienced delirium suspect related to systemic steroids; she does have a baseline h/o chronic mental health problems. During this admission, MDs are noting improvement in Acute on chronic hypercarbic respiratory failure, but pt continues to have Hyponatremia-patient has history of psychogenic polydipsia and hypokalemia which MD is addressing. Pt has been eating a regular diet per MD order since admission w/ no s/s of dysphagia noted by staff/pt. Pt does have c/o difficulty swallowing the large  potassium tablets; NSG agreed.   Assessment / Plan / Recommendation Clinical Impression  Pt appears to present w/ adequate oropharyngeal phase swallow function w/ no immediate, overt s/s of aspiration noted w/ po trials given - though only a few d/t pt's decline of wanting po's initially. Pt consumed po trials w/ no overt coughing or throat clearing noted; no change in respiratory status or wet vocal quality appreciated post trials. Pt does have low Sodium and is receiving salt tablet txs, on multiple medications d/t declined respiratory status. Also reported is pt having n/v (per Dietician) which can increase risk for aspiration of regurgitated material/Reflux. Altogether, pt's declined medical status appears to be heavily impacting her desire to eat/drink; Dietician is following. Pt was able to id cereal w/ milk and juice to SLP which will be ordered now for pt. Discussed aspiration precautions w/ pt and need to sit upright supported for any po intake. Recommended po meds and/or any lage tablets be taken w/ Applesauce or in a liquid/powder form for easier swallowing. Pt agreed.      Aspiration Risk   (reduced-mild (regurgitation; positioning; lengthy illness))    Diet Recommendation  regular/mech soft; thin liquids; aspiration precautions; reflux precautions; tray setup and positioning upright supported for any po's  Medication Administration: Whole meds with puree (or in liquid/powder form)    Other  Recommendations Recommended Consults:  (Diteician following) Oral Care Recommendations: Oral care BID;Staff/trained caregiver to provide oral care   Follow up Recommendations  None (TBD)    Frequency and Duration min 2x/week  1 week       Prognosis Prognosis for  Safe Diet Advancement: Good Barriers to Reach Goals:  (respiratory status)      Swallow Study   General Date of Onset: 05/22/15 HPI: Pt is a 75 y.o. female with a known history of COPD non-oxygen requiring, FTT, protein-calorie  malnutrition, chronic mental health issues, seizures, HTN, peripheral neuropathy presenting the hospital after mechanical fall at home unable to rise off floor secondary to weakness. On arrival to emergency department noted to be hypoxic with O2 saturations in the mid 70s on room air and had improvement of oxygenation requiring nonrebreather however given increased work of breathing placed on BiPAP therapy. O2 saturations remained in the high 80s. She complained of nonproductive cough shortness of breath, however, stated this is her baseline. No elevation of temp at admission per chart vitals. During this admission, pt has experienced delirium suspect related to systemic steroids; she does have a baseline h/o chronic mental health problems. During this admission, MDs are noting improvement in Acute on chronic hypercarbic respiratory failure, but pt continues to have Hyponatremia-patient has history of psychogenic polydipsia and hypokalemia which MD is addressing. Pt has been eating a regular diet per MD order since admission w/ no s/s of dysphagia noted by staff/pt. Pt does have c/o difficulty swallowing the large potassium tablets; NSG agreed. Type of Study: Bedside Swallow Evaluation Previous Swallow Assessment: none indicated Diet Prior to this Study: Regular;Thin liquids Temperature Spikes Noted: Yes (102;  wbc not elevated) Respiratory Status: Nasal cannula (4 liters) History of Recent Intubation: No Behavior/Cognition: Alert;Cooperative;Distractible;Requires cueing (required min. encouragement to participate) Oral Cavity Assessment: Within Functional Limits Oral Care Completed by SLP: Recent completion by staff Oral Cavity - Dentition: Dentures, top;Dentures, bottom Vision: Functional for self-feeding Self-Feeding Abilities: Able to feed self;Needs set up Patient Positioning: Upright in bed Baseline Vocal Quality: Normal Volitional Cough: Strong;Congested Volitional Swallow: Able to elicit     Oral/Motor/Sensory Function Overall Oral Motor/Sensory Function: Within functional limits   Ice Chips Ice chips: Not tested   Thin Liquid Thin Liquid: Within functional limits Presentation: Self Fed;Straw (~9-10 swallows)    Nectar Thick Nectar Thick Liquid: Not tested   Honey Thick Honey Thick Liquid: Not tested   Puree Puree: Not tested   Solid   GO   Solid: Within functional limits Presentation: Self Fed;Spoon (3 trials) Other Comments: pt is not able to find food that she wants to eat; she insists that the food is "greasy".        Jerilynn Som, MS, CCC-SLP  Candace Woods 06/02/2015,3:43 PM

## 2015-06-02 NOTE — Progress Notes (Signed)
Date: 06/02/2015,   MRN# [id number] Candace Woods Risk 03/11/1940 Code Status:     Code Status Orders        Start     Ordered   05/22/15 1448  Do not attempt resuscitation (DNR)   Continuous    Question Answer Comment  In the event of cardiac or respiratory ARREST Do not call a "code blue"   In the event of cardiac or respiratory ARREST Do not perform Intubation, CPR, defibrillation or ACLS   In the event of cardiac or respiratory ARREST Use medication by any route, position, wound care, and other measures to relive pain and suffering. May use oxygen, suction and manual treatment of airway obstruction as needed for comfort.      05/22/15 1447    Code Status History    Date Active Date Inactive Code Status Order ID Comments User Context   05/22/2015 11:15 AM 05/22/2015  2:47 PM Full Code [id number]  Wyatt Hasteavid K Hower, MD ED      CC: sob,, copd exacerbation  HPI:   PMHX:   Past Medical History  Diagnosis Date  . COPD (chronic obstructive pulmonary disease) (HCC)   . Hypertension   . Seizures (HCC)   . Peripheral neuropathy (HCC)   . Torticollis Right  . Anxiety   . Depression   . Psychogenic polydipsia   . MRSA pneumonia (HCC)     2014  . Hyperlipemia   . Asthma    Surgical Hx:  Past Surgical History  Procedure Laterality Date  . Abdominal hysterectomy     Family Hx:  Family History  Problem Relation Age of Onset  . Hypertension Other    Social Hx:   Social History  Substance Use Topics  . Smoking status: Current Every Day Smoker -- 2.00 packs/day for 55 years    Types: Cigarettes  . Smokeless tobacco: None  . Alcohol Use: No   Medication:    Home Medication:  No current outpatient prescriptions on file.  Current Medication: @CURMEDTAB @   Allergies:  Aspirin; Codeine; Contrast media; Penicillins; and Sulfa antibiotics  Review of Systems: Gen:  Denies  fever, sweats, chills HEENT: Denies blurred vision, double vision, ear pain, eye pain, hearing loss, nose  bleeds, sore throat Cvc:  No dizziness, chest pain or heaviness Resp: drowsy but voices no pleurisy, less sob and wheezing   Gi: Denies swallowing difficulty, stomach pain, nausea or vomiting, diarrhea, constipation, bowel incontinence Gu:  Denies bladder incontinence, burning urine Ext:   No Joint pain, stiffness or swelling Skin: No skin rash, easy bruising or bleeding or hives Endoc:  No polyuria, polydipsia , polyphagia or weight change Psych: No depression, insomnia or hallucinations  Other:  All other systems negative  Physical Examination:   VS: BP 116/62 mmHg  Pulse 83  Temp(Src) 98.3 F (36.8 C) (Oral)  Resp 18  Ht 5\' 4"  (1.626 Woods)  Wt 158 lb (71.668 kg)  BMI 27.11 kg/m2  SpO2 92%  General Appearance: Awaken from sleep, easily falls asleep Neuro/psych: without focal findings, mental status, speech normal, cranial nerves 2-12 intact, reflexes normal and symmetric, sensation grossly normal  HEENT: PERRLA, EOM intact, no ptosis, no other lesions noticed Pulmonary:.No wheezing, No rales    Cardiovascular:  Normal S1,S2.  No Woods/r/g.      Abdomen:Benign, Soft, non-tender, No masses, hepatosplenomegaly, No lymphadenopathy Endoc: No evident thyromegaly, no signs of acromegaly or Cushing features Skin:   warm, no rashes, no ecchymosis  Extremities: normal, no cyanosis, clubbing,  no edema, warm with normal capillary refill.    Labs results:   Recent Labs     05/31/15  0519  06/01/15  0514  HGB   --   13.0  HCT   --   39.1  MCV   --   88.1  WBC   --   5.7  BUN  9  15  CREATININE  0.50  0.75  GLUCOSE  94  95  CALCIUM  8.3*  8.4*  ,   Rad results:  CLINICAL DATA: Cough. History of COPD, hypertension, MRSA pneumonia, and asthma.  EXAM: CHEST 1 VIEW  COMPARISON: 05/26/2015  FINDINGS: Borderline heart size with normal pulmonary vascularity. Hyperinflation may indicate emphysema. Infiltration or atelectasis in both lung bases, similar to previous study. No  blunting of costophrenic angles. No pneumothorax. Calcification of the aorta. Mediastinal contours appear intact. A catheter is demonstrated in the right arm with tip over the brachial region.  IMPRESSION: Persistent infiltration or atelectasis in both lung bases. Emphysematous changes.   Electronically Signed  By: Burman Nieves Woods.D.  On: 05/31/2015 23:08   Assessment and Plan: Pt seen full note to follow, agree with present tx   I have personally obtained a history, examined the patient, evaluated laboratory and imaging results, formulated the assessment and plan and placed orders.  The Patient requires high complexity decision making for assessment and support, frequent evaluation and titration of therapies, application of advanced monitoring technologies and extensive interpretation of multiple databases.   Manilla Strieter,Woods.D. Pulmonary & Critical care Medicine [facility]

## 2015-06-03 LAB — SODIUM
SODIUM: 125 mmol/L — AB (ref 135–145)
SODIUM: 124 mmol/L — AB (ref 135–145)
Sodium: 125 mmol/L — ABNORMAL LOW (ref 135–145)
Sodium: 123 mmol/L — ABNORMAL LOW (ref 135–145)

## 2015-06-03 LAB — PHENYTOIN LEVEL, TOTAL: Phenytoin Lvl: 5.5 ug/mL — ABNORMAL LOW (ref 10.0–20.0)

## 2015-06-03 MED ORDER — PHENYTOIN SODIUM EXTENDED 100 MG PO CAPS
300.0000 mg | ORAL_CAPSULE | Freq: Once | ORAL | Status: AC
  Administered 2015-06-03: 300 mg via ORAL

## 2015-06-03 MED ORDER — TOLVAPTAN 15 MG PO TABS
15.0000 mg | ORAL_TABLET | Freq: Every day | ORAL | Status: DC
  Administered 2015-06-03 – 2015-06-04 (×2): 15 mg via ORAL
  Filled 2015-06-03 (×2): qty 1

## 2015-06-03 NOTE — Progress Notes (Signed)
[facility]  ROUNDING NOTE   Subjective:   Influenza B positive.   Na 125 - tolvaptan 4/20 and 4/21  Many containers of fluid at bedside.   Objective:  Vital signs in last 24 hours:  Temp:  [98.3 F (36.8 C)-100.2 F (37.9 C)] 98.7 F (37.1 C) (04/21 0437) Pulse Rate:  [83-106] 98 (04/21 0903) Resp:  [18-24] 18 (04/21 0437) BP: (105-136)/(51-72) 105/51 mmHg (04/21 0903) SpO2:  [67 %-93 %] 90 % (04/21 0446)  Weight change:  Filed Weights   05/22/15 0810 05/22/15 1321 05/26/15 1428  Weight: 75.297 kg (166 lb) 82 kg (180 lb 12.4 oz) 71.668 kg (158 lb)    Intake/Output: I/O last 3 completed shifts: In: 100 [P.O.:100] Out: 0    Intake/Output this shift:  Total I/O In: 120 [P.O.:120] Out: -   Physical Exam: General: No acute distress  Head: Normocephalic, atraumatic. Dry oral mucosal membranes  Eyes: Anicteric  Neck: +torticollis  Lungs:  Rales on the right  Heart: S1S2 no rubs, irregular   Abdomen:  Soft, nontender, BS present   Extremities: no peripheral edema.  Neurologic: Nonfocal, moving all four extremities, Able to follow commands   Skin: No lesions       Basic Metabolic Panel:  Recent Labs Lab 05/28/15 0658 05/29/15 0809 05/30/15 0412 05/31/15 0519 06/01/15 0514 06/02/15 0509 06/02/15 1050 06/02/15 1648 06/02/15 2222 06/03/15 0504  NA 126* 126* 125* 123* 125* 123* 119* 122* 124* 125*  K 2.8* 4.0 4.4 4.7 4.9  --   --   --   --   --   CL 85* 87* 87* 87* 87*  --   --   --   --   --   CO2 32 31 33* 27 29  --   --   --   --   --   GLUCOSE 131* 183* 144* 94 95  --   --   --   --   --   BUN --   --   --   --   --   CREATININE 0.40* 0.47 0.50 0.50 0.75  --   --   --   --   --   CALCIUM 8.6* 8.6* 8.2* 8.3* 8.4*  --   --   --   --   --   MG  --  1.4* 1.6* 1.8  --   --   --   --   --   --   PHOS  --   --   --   --  4.4  --   --   --   --   --     Liver Function Tests:  Recent Labs Lab 06/01/15 0514  ALBUMIN 2.8*    No results for input(s): LIPASE, AMYLASE in the last 168 hours. No results for input(s): AMMONIA in the last 168 hours.  CBC:  Recent Labs Lab 06/01/15 0514  WBC 5.7  HGB 13.0  HCT 39.1  MCV 88.1  PLT 150    Cardiac Enzymes: No results for input(s): CKTOTAL, CKMB, CKMBINDEX, TROPONINI in the last 168 hours.  BNP: Invalid input(s): POCBNP  CBG: No results for input(s): GLUCAP in the last 168 hours.  Microbiology: Results for orders placed or performed during the hospital encounter of 05/22/15  Rapid Influenza A&B Antigens [facility] only)     Status: None   Collection Time: 05/22/15  8:44 AM  Result Value Ref Range Status  Influenza A ([facility]) NEGATIVE NEGATIVE Final   Influenza B ([facility]) NEGATIVE NEGATIVE Final  Blood culture (routine x 2)     Status: None   Collection Time: 05/22/15  8:44 AM  Result Value Ref Range Status   Specimen Description BLOOD LEFT HAND  Final   Special Requests BOTTLES DRAWN AEROBIC AND ANAEROBIC  1CC  Final   Culture NO GROWTH 8 DAYS  Final   Report Status 05/30/2015 FINAL  Final  Blood culture (routine x 2)     Status: None   Collection Time: 05/22/15  8:44 AM  Result Value Ref Range Status   Specimen Description BLOOD LEFT WRIST  Final   Special Requests BOTTLES DRAWN AEROBIC AND ANAEROBIC  1CC  Final   Culture NO GROWTH 8 DAYS  Final   Report Status 05/30/2015 FINAL  Final  Urine culture     Status: Abnormal   Collection Time: 05/22/15 11:31 AM  Result Value Ref Range Status   Specimen Description URINE, RANDOM  Final   Special Requests NONE  Final   Culture >=100,000 COLONIES/mL ESCHERICHIA COLI (A)  Final   Report Status 05/24/2015 FINAL  Final   Organism ID, Bacteria ESCHERICHIA COLI (A)  Final      Susceptibility   Escherichia coli - MIC*    AMPICILLIN >=32 RESISTANT Resistant     CEFAZOLIN 8 SENSITIVE Sensitive     CEFTRIAXONE <=1 SENSITIVE Sensitive     CIPROFLOXACIN >=4 RESISTANT Resistant     GENTAMICIN <=1 SENSITIVE  Sensitive     IMIPENEM <=0.25 SENSITIVE Sensitive     NITROFURANTOIN <=16 SENSITIVE Sensitive     TRIMETH/SULFA <=20 SENSITIVE Sensitive     AMPICILLIN/SULBACTAM 4 SENSITIVE Sensitive     PIP/TAZO <=4 SENSITIVE Sensitive     Extended ESBL NEGATIVE Sensitive     * >=100,000 COLONIES/mL ESCHERICHIA COLI  MRSA PCR Screening     Status: None   Collection Time: 05/22/15  1:19 PM  Result Value Ref Range Status   MRSA by PCR NEGATIVE NEGATIVE Final    Comment:        The GeneXpert MRSA Assay (FDA approved for NASAL specimens only), is one component of a comprehensive MRSA colonization surveillance program. It is not intended to diagnose MRSA infection nor to guide or monitor treatment for MRSA infections.   CULTURE, BLOOD (ROUTINE X 2) w Reflex to PCR ID Panel     Status: None (Preliminary result)   Collection Time: 05/31/15  9:08 PM  Result Value Ref Range Status   Specimen Description BLOOD RIGHT WRIST  Final   Special Requests BOTTLES DRAWN AEROBIC AND ANAEROBIC 5ML  Final   Culture NO GROWTH 3 DAYS  Final   Report Status PENDING  Incomplete  CULTURE, BLOOD (ROUTINE X 2) w Reflex to PCR ID Panel     Status: None (Preliminary result)   Collection Time: 05/31/15  9:10 PM  Result Value Ref Range Status   Specimen Description BLOOD RIGHT HAND  Final   Special Requests BOTTLES DRAWN AEROBIC AND ANAEROBIC 5ML  Final   Culture NO GROWTH 3 DAYS  Final   Report Status PENDING  Incomplete  Urine culture     Status: None   Collection Time: 06/01/15 12:59 AM  Result Value Ref Range Status   Specimen Description URINE, RANDOM  Final   Special Requests NONE  Final   Culture NO GROWTH 1 DAY  Final   Report Status 06/02/2015 FINAL  Final    Coagulation Studies:  No results for input(s): LABPROT, INR in the last 72 hours.  Urinalysis:  Recent Labs  06/01/15 0059  COLORURINE YELLOW*  LABSPEC 1.009  PHURINE 7.0  GLUCOSEU NEGATIVE  HGBUR 2+*  BILIRUBINUR NEGATIVE  KETONESUR  NEGATIVE  PROTEINUR NEGATIVE  NITRITE NEGATIVE  LEUKOCYTESUR NEGATIVE      Imaging: No results found.   Medications:     . antiseptic oral rinse  7 mL Mouth Rinse BID  . budesonide (PULMICORT) nebulizer solution  0.25 mg Nebulization BID  . ceFEPime (MAXIPIME) IV  2 g Intravenous Q12H  . enoxaparin (LOVENOX) injection  40 mg Subcutaneous Q24H  . feeding supplement (ENSURE ENLIVE)  237 mL Oral BID BM  . furosemide  20 mg Oral Daily  . ipratropium-albuterol  3 mL Nebulization Q6H  . LORazepam  1 mg Intravenous BID  . metoprolol tartrate  25 mg Oral Q6H  . OLANZapine zydis  5 mg Oral QHS  . oseltamivir  75 mg Oral BID  . phenytoin  300 mg Oral QHS  . potassium chloride  20 mEq Oral Daily  . sodium chloride flush  3 mL Intravenous Q12H  . sodium chloride  1 g Oral BID WC  . tamsulosin  0.4 mg Oral Daily  . tolvaptan  15 mg Oral Daily   acetaminophen **OR** [DISCONTINUED] acetaminophen, albuterol, haloperidol lactate, hydrALAZINE, metoprolol, ondansetron **OR** ondansetron (ZOFRAN) IV, sodium chloride flush  Assessment/ Plan:  75 y.o. female with past medical history of COPD, hypertension, seizure disorder, peripheral neuropathy, anxiety, depression, history of psychogenic polydipsia who presented to [facility] with mechanical fall. Patient known to [place] from prior admissions as we saw her for hyponatremia in 2014.  1.  Hyponatremia, with prior hx of psychogenic polydipsia: sodium 125.  - fluid restriction: education provided to patient again today. Discussed with nursing staff.  - salt tabs - furosemide  - tolvaptan given today and yesterday.   2. Hypokalemia: now resolved. K 4.9 - PO potassium replacement - monitor closely  3.  Urinary Tract Infection: E Coli - cefepime.   4. Acute exacerbation of COPD: with positive influenza B.  - supportive care.     LOS: 12 Candace Woods 4/21/201710:06 AM

## 2015-06-03 NOTE — Plan of Care (Signed)
Problem: SLP Dysphagia Goals Goal: Misc Dysphagia Goal Pt will safely tolerate po diet of least restrictive consistency w/ no overt s/s of aspiration noted by Staff/pt/family x3 sessions.    

## 2015-06-03 NOTE — Clinical Social Work Note (Signed)
Patient has received her pasrr and has a bed at [facility]. CSW will facilitate discharge when time. York SpanielMonica Evagelia Knack MSW,LCSW [phone]

## 2015-06-03 NOTE — Care Management (Signed)
Sp[facility] with Diamantina ProvidenceKita Brooks, [facility]. Updated on patient condition and discharge disposition.

## 2015-06-03 NOTE — Care Management Note (Addendum)
Case Management Note  Patient Details  Name: Candace Woods MRN: [id number] Date of Birth: 1940-08-23  Subjective/Objective:    CSW working on SNF placement to [facility]. Will assist as needed. Has tested positive for the Flu. NA 125. Continues on NA replacement. It is anticipated that patient will be ready for discharge on Monday.                Action/Plan: [facility].   Expected Discharge Date:                  Expected Discharge Plan:  Skilled Nursing Facility ([facility])  In-House Referral:     Discharge planning Services  CM Consult  Post Acute Care Choice:    Choice offered to:     DME Arranged:    DME Agency:     HH Arranged:    HH Agency:   ([facility])  Status of Service:  Completed, signed off  Medicare Important Message Given:  Yes Date Medicare IM Given:    Medicare IM give by:    Date Additional Medicare IM Given:    Additional Medicare Important Message give by:     If discussed at Long Length of Stay Meetings, dates discussed:    Additional Comments:  Marily MemosLisa M Aalliyah Kilker, RN 06/03/2015, 9:52 AM

## 2015-06-03 NOTE — Progress Notes (Signed)
Found pt with O2 off. O2 sat 67%. Encouragaed pt to wear O2 and not to removed from nose. Pt states that she understands. Placed pt on nebulizer treatment and replaced O2 after nebulizer treatment. O2 sats remained in the mid 90's after treatment.

## 2015-06-03 NOTE — Progress Notes (Signed)
Educated pt again about the importance of leaving O2 in her nose and not in her bed. Pt states that she doesn't know how to leave it in her nose. The bed needs it not her.

## 2015-06-03 NOTE — Progress Notes (Signed)
Physical Therapy Treatment Patient Details Name: Candace GripDonna M Hallums MRN: [id number] DOB: September 07, 1940 Today's Date: 06/03/2015    History of Present Illness Candace Woods is a 75 y.o. female with a known history of COPD non-oxygen requiring presenting the hospital after mechanical fall at home unable to rise off floor secondary to weakness. On arrival to emergency department noted to be hypoxic with O2 saturations in the mid 70s on room air and had improvement of oxygenation requiring nonrebreather however given increased work of breathing placed on BiPAP therapy. O2 saturations remained in the high 80s. She complains of nonproductive cough shortness of breath however, states this is her baseline. Family reports 2 falls in the last 12 months. Pt is still confused by improved from prior days. At baseline she is AOx4. Pt is a member of [facility] where she goes on Tuesdays. She has a HH Aid 7d/wk for 3 hours/day.    PT Comments    Pt agrees to session this morning.  O2 sats 90 at rest with 2 lpm 02.  Participated in 5 reps of supine tx with rest breaks given while encouraging proper breathing.  Mouth breaths at times.  Sats 87%.  Session limited by general fatigue and decreased 02 sats with minimal activity.     Follow Up Recommendations  SNF     Equipment Recommendations       Recommendations for Other Services       Precautions / Restrictions Restrictions Weight Bearing Restrictions: No    Mobility  Bed Mobility                  Transfers                    Ambulation/Gait                 Stairs            Wheelchair Mobility    Modified Rankin (Stroke Patients Only)       Balance                                    Cognition Arousal/Alertness: Lethargic Behavior During Therapy: WFL for tasks assessed/performed                        Exercises      General Comments        Pertinent Vitals/Pain Pain Assessment:  No/denies pain    Home Living                      Prior Function            PT Goals (current goals can now be found in the care plan section)      Frequency  Min 2X/week    PT Plan Current plan remains appropriate    Co-evaluation             End of Session Equipment Utilized During Treatment: Oxygen Activity Tolerance: Patient limited by lethargy;Patient limited by fatigue       Time: 1100-1110 PT Time Calculation (min) (ACUTE ONLY): 10 min  Charges:  $Therapeutic Exercise: 8-22 mins                    G Codes:      Danielle DessSarah Danyle Boening, PTA 06/03/2015, 11:16 AM

## 2015-06-03 NOTE — Progress Notes (Signed)
[employer] - [place] at [facility]   PATIENT NAME: Candace ChaletDonna Woods    MR#:  [id number]  DATE OF BIRTH:  07/17/1940  SUBJECTIVE:  High fever to almost 103F yesterday, 06/01/15. Pancultured, no growth, continue cefepime, get influenza test. Blood, urine cx negative so far. UA unremarkable. Chest xray showed infiltrates in both lung bases, similar to prior. Sodium still 119 this a.m. On fluid restriction , salt tablets,  Furosemide, no improvement.   Mental status has overall  Improved, but remains slow and difficult to communicate with, withdrawn. Shortness of breath and respiratory distress improved, minimal cough with clear sputum production. Speech therapist evaluated patient and did not feel that she aspirates liquids. Patient complained of difficulty swallowing big potassium pill. Sodium 125. Today, receiving one more dose of tolvaptan. Influenza test was positive, initiated on Tamiflu last night, fever to 100.2 last night  REVIEW OF SYSTEMS:    Review of Systems  Unable to perform ROS Constitutional: Negative for fever, chills and weight loss.  HENT: Negative for congestion.   Eyes: Negative for blurred vision and double vision.  Respiratory: Positive for cough, sputum production and wheezing. Negative for shortness of breath.   Cardiovascular: Negative for chest pain, palpitations, orthopnea, leg swelling and PND.  Gastrointestinal: Negative for nausea, vomiting, abdominal pain, diarrhea, constipation and blood in stool.  Genitourinary: Negative for dysuria, urgency, frequency and hematuria.  Musculoskeletal: Negative for falls.  Neurological: Negative for dizziness, tremors, focal weakness and headaches.  Endo/Heme/Allergies: Does not bruise/bleed easily.  Psychiatric/Behavioral: Negative for depression. The patient does not have insomnia.     Nutrition: Regular Tolerating Diet: Yes Tolerating PT: Evaluation noted   DRUG ALLERGIES:   Allergies  Allergen Reactions   . Aspirin   . Codeine   . Contrast Media [Iodinated Diagnostic Agents] Hives    Hives with contrast media injections  . Penicillins   . Sulfa Antibiotics     VITALS:  Blood pressure 95/55, pulse 99, temperature 98.6 F (37 C), temperature source Oral, resp. rate 21, height 5\' 4"  (1.626 m), weight 71.668 kg (158 lb), SpO2 90 %.  PHYSICAL EXAMINATION:   Physical Exam  GENERAL:  75 y.o.-year-old patient lying in the bed in no acute distress.  EYES: Pupils equal, round, reactive to light and accommodation. No scleral icterus. Extraocular muscles intact.  HEENT: Head atraumatic, normocephalic. Oropharynx and nasopharynx clear.  NECK:  Supple, no jugular venous distention. No thyroid enlargement, no tenderness.  LUNGS: less  expiratory wheezing, or air entrance, few diffuse rhonchi bilaterally. Not using accessory muscles, no dullness to percussion. Relatively comfortable CARDIOVASCULAR: S1, S2 normal. No murmurs, rubs, or gallops.  ABDOMEN: Soft, nontender, nondistended. Bowel sounds present. No organomegaly or mass.  EXTREMITIES: No cyanosis, clubbing or edema b/l.    NEUROLOGIC: Cranial nerves II through XII are intact. No focal Motor or sensory deficits b/l. Globally weak, positive torticollis PSYCHIATRIC: The patient is alert and oriented x 2.  SKIN: No obvious rash, lesion, or ulcer.    LABORATORY PANEL:   CBC  Recent Labs Lab 06/01/15 0514  WBC 5.7  HGB 13.0  HCT 39.1  PLT 150   ------------------------------------------------------------------------------------------------------------------  Chemistries   Recent Labs Lab 05/31/15 0519 06/01/15 0514  06/03/15 1041  NA 123* 125*  < > 125*  K 4.7 4.9  --   --   CL 87* 87*  --   --   CO2 27 29  --   --   GLUCOSE 94 95  --   --  BUN 9 15  --   --   CREATININE 0.50 0.75  --   --   CALCIUM 8.3* 8.4*  --   --   MG 1.8  --   --   --   < > = values in this interval not  displayed. ------------------------------------------------------------------------------------------------------------------  Cardiac Enzymes No results for input(s): TROPONINI in the last 168 hours. ------------------------------------------------------------------------------------------------------------------  RADIOLOGY:  No results found.   ASSESSMENT AND PLAN:   75 year old female with past medical history of COPD, hypertension, history of seizures, peripheral neuropathy, anxiety/depression, psychogenic polydipsia, hyperlipidemia presented to the hospital due to acute on chronic respiratory failure with COPD exacerbation.  1. COPD Exacerbation - wheezing, bronchospasm has improved. Speech therapist evaluated patient and did not feel that she aspirates liquids.  Appreciate speech therapist involvement, advice.  -Was on IV steroids but taken off due to agitation. -Continue duo nebs, Pulmicort nebs. Clinically better.      2. AMS/Encephalopathy - likely due to UTI/Hyponatremia  - Finished treatment for UTI and d/c Ceftriaxone. Sodium is 125 today after 2 doses of Tolvaptan on the  20th and 21st of April 2017 , mental status is stable , some better overall, able to engage in conversation.  Tapering off Ativan.  Cont. Zyprexa. Psychiatrist is following, no change of medications.  3. Hyponatremia-patient has history of psychogenic polydipsia. This is not likely the cause presently.  - Continue salt tabs , luid restriction , furosemide. Status post 2 doses of talvaptan, yesterday and today,   per nephrologist recommendations , follow Sodium in the morning , Improving, significant fluid loss over the past few days, overall net negative of approximately 7.5 L. . Blood pressure is low in the morning today, hold metoprolol as needed  4. Urinary tract infection-secondary to Escherichia coli.  -Finished treatment with antibiotics. Rechecking cultures, negative so far  5. History of  seizures-continue Dilantin Orally,  level is subtherapeutic, given additional dose of Dilantin, rechecking Dilantin level tomorrow morning   6. Hypokalemia/hypomagnesemia-potassium improved with supplementation.  7. Urinary retention- Foley catheter was discontinued . Continue Flomax. Checking postvoid residual closely, however the patient denies any urinary symptoms.  8. Influenza, continue Tamiflu, following patient's fever curve, seems to be improving overall.   PT eval noted , patient will likely need SNF placement when stable,  provided sodium level improves with current therapy, hopefully early next week  All the records are reviewed and case discussed with Care Management/Social Workerr. Management plans discussed with the patient, family and they are in agreement.  CODE STATUS: DO NOT RESUSCITATE  DVT Prophylaxis: Lovenox  TOTAL TIME TAKING CARE OF THIS PATIENT: 35 minutes.   POSSIBLE D/C IN 3-4  DAYS, DEPENDING ON CLINICAL CONDITION.   Katharina Caper M.D on 06/03/2015 at 12:48 PM  Between 7am to 6pm - Pager - [phone]  After 6pm go to [email or web address] - password [email or web address] [facility]  [facility]  Office  [phone]  CC: Primary care physician; Bobbye Morton, MD

## 2015-06-04 LAB — PHENYTOIN LEVEL, TOTAL: Phenytoin Lvl: 7.8 ug/mL — ABNORMAL LOW (ref 10.0–20.0)

## 2015-06-04 LAB — CREATININE, SERUM
Creatinine, Ser: 0.66 mg/dL (ref 0.44–1.00)
GFR calc Af Amer: >60 mL/min (ref >60)
GFR calc non Af Amer: >60 mL/min (ref >60)

## 2015-06-04 LAB — SODIUM
SODIUM: 125 mmol/L — AB (ref 135–145)
Sodium: 126 mmol/L — ABNORMAL LOW (ref 135–145)

## 2015-06-04 MED ORDER — LORAZEPAM 2 MG/ML IJ SOLN
1.0000 mg | Freq: Every day | INTRAMUSCULAR | Status: DC
  Administered 2015-06-05 – 2015-06-06 (×2): 1 mg via INTRAVENOUS
  Filled 2015-06-04 (×2): qty 1

## 2015-06-04 MED ORDER — IPRATROPIUM-ALBUTEROL 0.5-2.5 (3) MG/3ML IN SOLN
3.0000 mL | Freq: Three times a day (TID) | RESPIRATORY_TRACT | Status: DC
  Administered 2015-06-04 – 2015-06-07 (×7): 3 mL via RESPIRATORY_TRACT
  Filled 2015-06-04 (×8): qty 3

## 2015-06-04 NOTE — Progress Notes (Signed)
[facility]  ROUNDING NOTE   Subjective:   Na 125  Objective:  Vital signs in last 24 hours:  Temp:  [98.6 F (37 C)-98.7 F (37.1 C)] 98.7 F (37.1 C) (04/22 0900) Pulse Rate:  [96-112] 112 (04/22 0900) Resp:  [20-21] 20 (04/21 2056) BP: (95-135)/(55-100) 123/64 mmHg (04/22 0900) SpO2:  [90 %-93 %] 90 % (04/22 0900)  Weight change:  Filed Weights   05/22/15 0810 05/22/15 1321 05/26/15 1428  Weight: 75.297 kg (166 lb) 82 kg (180 lb 12.4 oz) 71.668 kg (158 lb)    Intake/Output: I/O last 3 completed shifts: In: 120 [P.O.:120] Out: -    Intake/Output this shift:     Physical Exam: General: No acute distress  Head: Normocephalic, atraumatic. Dry oral mucosal membranes  Eyes: Anicteric  Neck: +torticollis  Lungs:  Rales on the right  Heart: S1S2 no rubs, irregular   Abdomen:  Soft, nontender, BS present   Extremities: no peripheral edema.  Neurologic: Nonfocal, moving all four extremities, Able to follow commands   Skin: No lesions       Basic Metabolic Panel:  Recent Labs Lab 05/29/15 0809 05/30/15 0412 05/31/15 0519 06/01/15 0514  06/03/15 0504 06/03/15 1041 06/03/15 1730 06/03/15 2235 06/04/15 0443  NA 126* 125* 123* 125*  < > 125* 125* 123* 124* 125*  K 4.0 4.4 4.7 4.9  --   --   --   --   --   --   CL 87* 87* 87* 87*  --   --   --   --   --   --   CO2 31 33* 27 29  --   --   --   --   --   --   GLUCOSE 183* 144* 94 95  --   --   --   --   --   --   BUN --   --   --   --   --   --   CREATININE 0.47 0.50 0.50 0.75  --   --   --   --   --  0.66  CALCIUM 8.6* 8.2* 8.3* 8.4*  --   --   --   --   --   --   MG 1.4* 1.6* 1.8  --   --   --   --   --   --   --   PHOS  --   --   --  4.4  --   --   --   --   --   --   < > = values in this interval not displayed.  Liver Function Tests:  Recent Labs Lab 06/01/15 0514  ALBUMIN 2.8*   No results for input(s): LIPASE, AMYLASE in the last 168 hours. No results for input(s):  AMMONIA in the last 168 hours.  CBC:  Recent Labs Lab 06/01/15 0514  WBC 5.7  HGB 13.0  HCT 39.1  MCV 88.1  PLT 150    Cardiac Enzymes: No results for input(s): CKTOTAL, CKMB, CKMBINDEX, TROPONINI in the last 168 hours.  BNP: Invalid input(s): POCBNP  CBG: No results for input(s): GLUCAP in the last 168 hours.  Microbiology: Results for orders placed or performed during the hospital encounter of 05/22/15  Rapid Influenza A&B Antigens [facility] only)     Status: None   Collection Time: 05/22/15  8:44 AM  Result Value Ref Range Status   Influenza  A ([facility]) NEGATIVE NEGATIVE Final   Influenza B ([facility]) NEGATIVE NEGATIVE Final  Blood culture (routine x 2)     Status: None   Collection Time: 05/22/15  8:44 AM  Result Value Ref Range Status   Specimen Description BLOOD LEFT HAND  Final   Special Requests BOTTLES DRAWN AEROBIC AND ANAEROBIC  1CC  Final   Culture NO GROWTH 8 DAYS  Final   Report Status 05/30/2015 FINAL  Final  Blood culture (routine x 2)     Status: None   Collection Time: 05/22/15  8:44 AM  Result Value Ref Range Status   Specimen Description BLOOD LEFT WRIST  Final   Special Requests BOTTLES DRAWN AEROBIC AND ANAEROBIC  1CC  Final   Culture NO GROWTH 8 DAYS  Final   Report Status 05/30/2015 FINAL  Final  Urine culture     Status: Abnormal   Collection Time: 05/22/15 11:31 AM  Result Value Ref Range Status   Specimen Description URINE, RANDOM  Final   Special Requests NONE  Final   Culture >=100,000 COLONIES/mL ESCHERICHIA COLI (A)  Final   Report Status 05/24/2015 FINAL  Final   Organism ID, Bacteria ESCHERICHIA COLI (A)  Final      Susceptibility   Escherichia coli - MIC*    AMPICILLIN >=32 RESISTANT Resistant     CEFAZOLIN 8 SENSITIVE Sensitive     CEFTRIAXONE <=1 SENSITIVE Sensitive     CIPROFLOXACIN >=4 RESISTANT Resistant     GENTAMICIN <=1 SENSITIVE Sensitive     IMIPENEM <=0.25 SENSITIVE Sensitive     NITROFURANTOIN <=16 SENSITIVE Sensitive      TRIMETH/SULFA <=20 SENSITIVE Sensitive     AMPICILLIN/SULBACTAM 4 SENSITIVE Sensitive     PIP/TAZO <=4 SENSITIVE Sensitive     Extended ESBL NEGATIVE Sensitive     * >=100,000 COLONIES/mL ESCHERICHIA COLI  MRSA PCR Screening     Status: None   Collection Time: 05/22/15  1:19 PM  Result Value Ref Range Status   MRSA by PCR NEGATIVE NEGATIVE Final    Comment:        The GeneXpert MRSA Assay (FDA approved for NASAL specimens only), is one component of a comprehensive MRSA colonization surveillance program. It is not intended to diagnose MRSA infection nor to guide or monitor treatment for MRSA infections.   CULTURE, BLOOD (ROUTINE X 2) w Reflex to PCR ID Panel     Status: None (Preliminary result)   Collection Time: 05/31/15  9:08 PM  Result Value Ref Range Status   Specimen Description BLOOD RIGHT WRIST  Final   Special Requests BOTTLES DRAWN AEROBIC AND ANAEROBIC  Final   Culture NO GROWTH 3 DAYS  Final   Report Status PENDING  Incomplete  CULTURE, BLOOD (ROUTINE X 2) w Reflex to PCR ID Panel     Status: None (Preliminary result)   Collection Time: 05/31/15  9:10 PM  Result Value Ref Range Status   Specimen Description BLOOD RIGHT HAND  Final   Special Requests BOTTLES DRAWN AEROBIC AND ANAEROBIC  Final   Culture NO GROWTH 3 DAYS  Final   Report Status PENDING  Incomplete  Urine culture     Status: None   Collection Time: 06/01/15 12:59 AM  Result Value Ref Range Status   Specimen Description URINE, RANDOM  Final   Special Requests NONE  Final   Culture NO GROWTH 1 DAY  Final   Report Status 06/02/2015 FINAL  Final    Coagulation Studies: No  results for input(s): LABPROT, INR in the last 72 hours.  Urinalysis: No results for input(s): COLORURINE, LABSPEC, PHURINE, GLUCOSEU, HGBUR, BILIRUBINUR, KETONESUR, PROTEINUR, UROBILINOGEN, NITRITE, LEUKOCYTESUR in the last 72 hours.  Invalid input(s): APPERANCEUR    Imaging: No results found.   Medications:      . antiseptic oral rinse  7 mL Mouth Rinse BID  . budesonide (PULMICORT) nebulizer solution  0.25 mg Nebulization BID  . ceFEPime (MAXIPIME) IV  2 g Intravenous Q12H  . enoxaparin (LOVENOX) injection  40 mg Subcutaneous Q24H  . feeding supplement (ENSURE ENLIVE)  237 mL Oral BID BM  . furosemide  20 mg Oral Daily  . ipratropium-albuterol  3 mL Nebulization Q6H  . LORazepam  1 mg Intravenous BID  . metoprolol tartrate  25 mg Oral Q6H  . OLANZapine zydis  5 mg Oral QHS  . oseltamivir  75 mg Oral BID  . phenytoin  300 mg Oral QHS  . potassium chloride  20 mEq Oral Daily  . sodium chloride flush  3 mL Intravenous Q12H  . sodium chloride  1 g Oral BID WC  . tamsulosin  0.4 mg Oral Daily   acetaminophen **OR** [DISCONTINUED] acetaminophen, albuterol, haloperidol lactate, hydrALAZINE, metoprolol, ondansetron **OR** ondansetron (ZOFRAN) IV, sodium chloride flush  Assessment/ Plan:  75 y.o. female with past medical history of COPD, hypertension, seizure disorder, peripheral neuropathy, anxiety, depression, history of psychogenic polydipsia who [facility]. Patient known to us from prior admissions as we saw her for hyponatremia in 2014.  1.  Hyponatremia, with prior hx of psychogenic polydipsia: sodium 125.  - fluid restriction: education provided to patient again today. Discussed with nursing staff.  - salt tabs - furosemide  - No improvement with tolvaptan, will discontinue  2. Hypokalemia: now resolved. K 4.9 - PO potassium replacement - monitor closely  3.  Urinary Tract Infection: E Coli - cefepime.   4. Acute exacerbation of COPD: with positive influenza B.  - supportive care.     LOS: 13 Candace Woods 4/22/201711:32 AM

## 2015-06-04 NOTE — Progress Notes (Signed)
Pt continues to wheeze but has been refusing neb treatments. Pt stated that she will call if needed but will leave nebs scheduled at this time.

## 2015-06-04 NOTE — Progress Notes (Signed)
Speech Language Pathology Dysphagia Treatment Patient Details Name: Candace Woods MRN: [id number] DOB: [birth year] Today's Date: 06/04/2015 Time: [id number] SLP Time Calculation (min) (ACUTE ONLY): 22 min  Assessment / Plan / Recommendation Clinical Impression   pt presents with no overt ssx aspiration with regular with thin diet. Pt appears to have some confusion about place and purpose. Pt with poor po intake. Morning tray still in room during session pt only intake of yogurt and juice. SLP provided education on nutritional requirements and aspiration precautions. Pt able to giver verbal understanding of slp education.     Diet Recommendation    Regular with thin, cut tough meats.    SLP Plan Continue with current plan of care   Pertinent Vitals/Pain No pain reported   Swallowing Goals     General Behavior/Cognition: Alert;Cooperative;Confused Patient Positioning: Upright in bed Oral care provided: N/A HPI: Pt is a 75 y.o. female with a known history of COPD non-oxygen requiring, FTT, protein-calorie malnutrition, chronic mental health issues, seizures, HTN, peripheral neuropathy presenting the hospital after mechanical fall at home unable to rise off floor secondary to weakness. On arrival to emergency department noted to be hypoxic with O2 saturations in the mid 70s on room air and had improvement of oxygenation requiring nonrebreather however given increased work of breathing placed on BiPAP therapy. O2 saturations remained in the high 80s. She complained of nonproductive cough shortness of breath, however, stated this is her baseline. No elevation of temp at admission per chart vitals. During this admission, pt has experienced delirium suspect related to systemic steroids; she does have a baseline h/o chronic mental health problems. During this admission, MDs are noting improvement in Acute on chronic hypercarbic respiratory failure, but pt continues to have Hyponatremia-patient has  history of psychogenic polydipsia and hypokalemia which MD is addressing. Pt has been eating a regular diet per MD order since admission w/ no s/s of dysphagia noted by staff/pt. Pt does have c/o difficulty swallowing the large potassium tablets; NSG agreed.  Oral Cavity - Oral Hygiene     Dysphagia Treatment Family/Caregiver Educated: pt Treatment Methods: Skilled observation;Patient/caregiver education;Upgraded PO texture trial Patient observed directly with PO's: Yes Type of PO's observed: Regular;Thin liquids Feeding: Needs assist Liquids provided via: Teaspoon;Cup;Straw Oral Phase Signs & Symptoms: Prolonged mastication Pharyngeal Phase Signs & Symptoms: Suspected delayed swallow initiation   GO     Candace PelStacie Harris Woods 06/04/2015, 11:14 AM

## 2015-06-04 NOTE — Progress Notes (Signed)
[employer] - [place] at [facility]   PATIENT NAME: Candace Woods    MR#:  [id number]  DATE OF BIRTH:  01-17-1941  SUBJECTIVE:   Afebrile overnight. Respiratory status improving. Sodium still 125. No other acute events overnight.  REVIEW OF SYSTEMS:    Review of Systems  Constitutional: Negative for fever, chills and weight loss.  HENT: Negative for congestion.   Eyes: Negative for blurred vision and double vision.  Respiratory: Positive for cough, sputum production and wheezing. Negative for shortness of breath.   Cardiovascular: Negative for chest pain, palpitations, orthopnea, leg swelling and PND.  Gastrointestinal: Negative for nausea, vomiting, abdominal pain, diarrhea, constipation and blood in stool.  Genitourinary: Negative for dysuria, urgency, frequency and hematuria.  Musculoskeletal: Negative for falls.  Neurological: Negative for dizziness, tremors, focal weakness and headaches.  Endo/Heme/Allergies: Does not bruise/bleed easily.  Psychiatric/Behavioral: Negative for depression. The patient does not have insomnia.     Nutrition: Regular Tolerating Diet: Yes Tolerating PT: Evaluation noted   DRUG ALLERGIES:   Allergies  Allergen Reactions  . Aspirin   . Codeine   . Contrast Media [Iodinated Diagnostic Agents] Hives    Hives with contrast media injections  . Penicillins   . Sulfa Antibiotics     VITALS:  Blood pressure 123/64, pulse 112, temperature 98.7 F (37.1 C), temperature source Oral, resp. rate 20, height  (1.626 m), weight 71.668 kg (158 lb), SpO2 90 %.  PHYSICAL EXAMINATION:   Physical Exam  GENERAL:  75 y.o.-year-old patient lying in the bed in no acute distress.  EYES: Pupils equal, round, reactive to light and accommodation. No scleral icterus. Extraocular muscles intact.  HEENT: Head atraumatic, normocephalic. Oropharynx and nasopharynx clear.  NECK:  Supple, no jugular venous distention. No thyroid enlargement, no  tenderness.  LUNGS: less  End-expiratory wheezing b/l, no rales, few scattered rhonchi. Not using accessory muscles, no dullness to percussion. Relatively comfortable CARDIOVASCULAR: S1, S2 normal. No murmurs, rubs, or gallops.  ABDOMEN: Soft, nontender, nondistended. Bowel sounds present. No organomegaly or mass.  EXTREMITIES: No cyanosis, clubbing or edema b/l.    NEUROLOGIC: Cranial nerves II through XII are intact. No focal Motor or sensory deficits b/l. Globally weak, positive torticollis PSYCHIATRIC: The patient is alert and oriented x 2.  SKIN: No obvious rash, lesion, or ulcer.    LABORATORY PANEL:   CBC  Recent Labs Lab 06/01/15 0514  WBC 5.7  HGB 13.0  HCT 39.1  PLT 150   ------------------------------------------------------------------------------------------------------------------  Chemistries   Recent Labs Lab 05/31/15 0519 06/01/15 0514  06/04/15 0443 06/04/15 1055  NA 123* 125*  < > 125* 126*  K 4.7 4.9  --   --   --   CL 87* 87*  --   --   --   CO2 27 29  --   --   --   GLUCOSE 94 95  --   --   --   BUN 9 15  --   --   --   CREATININE 0.50 0.75  --  0.66  --   CALCIUM 8.3* 8.4*  --   --   --   MG 1.8  --   --   --   --   < > = values in this interval not displayed. ------------------------------------------------------------------------------------------------------------------  Cardiac Enzymes No results for input(s): TROPONINI in the last 168 hours. ------------------------------------------------------------------------------------------------------------------  RADIOLOGY:  No results found.   ASSESSMENT AND PLAN:   75 year old female with past medical  history of COPD, hypertension, history of seizures, peripheral neuropathy, anxiety/depression, psychogenic polydipsia, hyperlipidemia presented to the hospital due to acute on chronic respiratory failure with COPD exacerbation.  1. COPD Exacerbation - wheezing, bronchospasm Much improved.   -Appreciate speech eval and no evidence of aspiration. -Was on IV steroids but taken off due to agitation. -Continue duo nebs, Pulmicort nebs. Clinically better.      2. AMS/Encephalopathy - likely due to UTI/Hyponatremia  -Finished treatment for the UTI. Sodium is 125 today after 3 doses of Tolvaptan and now d/c by Nephro.  - mental status improving.  Cont. To taper Ativan. Cont. Zyprexa.  Psych following  3. Hyponatremia-patient has a history of psychogenic polydipsia. Sodium 125 despite getting Tolvaptan.  -appreciate nephrology input and continue fluid restriction, salt tabs. -Follow sodium.  4. Urinary tract infection-secondary to Escherichia coli/resolved and treated  5. History of seizures-continue Dilantin Orally,  level is subtherapeutic, given additional dose of Dilantin,  - level improved  6. Urinary retention- resolved and cont. Flomax  8. Influenza - continue Tamiflu,afebrile now - improving.   PT eval noted , patient will likely need SNF placement when stable,  provided sodium level improves with current therapy, hopefully early next week  All the records are reviewed and case discussed with Care Management/Social Workerr. Management plans discussed with the patient, family and they are in agreement.  CODE STATUS: DO NOT RESUSCITATE  DVT Prophylaxis: Lovenox  TOTAL TIME TAKING CARE OF THIS PATIENT: 25 minutes.   POSSIBLE D/C IN 2-3  DAYS, DEPENDING ON CLINICAL CONDITION.   [place] SirenSAINANI,VIVEK J M.D on 06/04/2015 at 2:35 PM  Between 7am to 6pm - Pager - [phone]  After 6pm go to [email or web address] - password [email or web address] [facility]  [facility]  Office  [phone]  CC: Primary care physician; Bobbye MortonSHARON A REILLY, MD

## 2015-06-05 LAB — CULTURE, BLOOD (ROUTINE X 2)
CULTURE: NO GROWTH
CULTURE: NO GROWTH

## 2015-06-05 LAB — COMPREHENSIVE METABOLIC PANEL
ALBUMIN: 2.8 g/dL — AB (ref 3.5–5.0)
ALT: 89 U/L — AB (ref 14–54)
AST: 160 U/L — AB (ref 15–41)
Alkaline Phosphatase: 84 U/L (ref 38–126)
Anion gap: 9 (ref 5–15)
BUN: 15 mg/dL (ref 6–20)
CHLORIDE: 93 mmol/L — AB (ref 101–111)
CO2: 27 mmol/L (ref 22–32)
CREATININE: 0.6 mg/dL (ref 0.44–1.00)
Calcium: 8.3 mg/dL — ABNORMAL LOW (ref 8.9–10.3)
GFR calc Af Amer: >60 mL/min (ref >60)
GLUCOSE: 103 mg/dL — AB (ref 65–99)
Potassium: 5.1 mmol/L (ref 3.5–5.1)
Sodium: 129 mmol/L — ABNORMAL LOW (ref 135–145)
Total Bilirubin: 0.7 mg/dL (ref 0.3–1.2)
Total Protein: 6 g/dL — ABNORMAL LOW (ref 6.5–8.1)

## 2015-06-05 LAB — CBC
HCT: 37.6 % (ref 35.0–47.0)
Hemoglobin: 12.5 g/dL (ref 12.0–16.0)
MCH: 30 pg (ref 26.0–34.0)
MCHC: 33.3 g/dL (ref 32.0–36.0)
MCV: 90.1 fL (ref 80.0–100.0)
PLATELETS: 135 10*3/uL — AB (ref 150–440)
RBC: 4.17 MIL/uL (ref 3.80–5.20)
RDW: 16.5 % — ABNORMAL HIGH (ref 11.5–14.5)
WBC: 3.7 10*3/uL (ref 3.6–11.0)

## 2015-06-05 MED ORDER — OXYMETAZOLINE HCL 0.05 % NA SOLN
1.0000 | Freq: Two times a day (BID) | NASAL | Status: DC
  Administered 2015-06-05 – 2015-06-07 (×5): 1 via NASAL
  Filled 2015-06-05: qty 15

## 2015-06-05 NOTE — Care Management Important Message (Signed)
Important Message  Patient Details  Name: Loraine GripDonna M Freer MRN: [id number] Date of Birth: 1940/12/20   Medicare Important Message Given:  Yes    Kawana Hegel A, RN 06/05/2015, 2:14 PM

## 2015-06-05 NOTE — Progress Notes (Signed)
[facility]  ROUNDING NOTE   Subjective:   Na 129  With ice water and coffee at bedside.    Objective:  Vital signs in last 24 hours:  Temp:  [97.8 F (36.6 C)-99.3 F (37.4 C)] 97.8 F (36.6 C) (04/23 0900) Pulse Rate:  [84-105] 84 (04/23 0900) Resp:  [14-20] 16 (04/23 0900) BP: (115-122)/(49-53) 115/52 mmHg (04/23 0900) SpO2:  [90 %-91 %] 91 % (04/23 0900)  Weight change:  Filed Weights   05/22/15 0810 05/22/15 1321 05/26/15 1428  Weight: 75.297 kg (166 lb) 82 kg (180 lb 12.4 oz) 71.668 kg (158 lb)    Intake/Output: I/O last 3 completed shifts: In: 300 [P.O.:300] Out: 401 [Urine:400; Stool:1]   Intake/Output this shift:     Physical Exam: General: No acute distress  Head: Normocephalic, atraumatic. Dry oral mucosal membranes  Eyes: Anicteric  Neck: +torticollis  Lungs:  Rales on the right  Heart: S1S2 no rubs, regular  Abdomen:  Soft, nontender, BS present   Extremities: no peripheral edema.  Neurologic: Nonfocal, moving all four extremities, Able to follow commands   Skin: No lesions       Basic Metabolic Panel:  Recent Labs Lab 05/30/15 0412 05/31/15 0519 06/01/15 0514  06/03/15 1730 06/03/15 2235 06/04/15 0443 06/04/15 1055 06/05/15 0603  NA 125* 123* 125*  < > 123* 124* 125* 126* 129*  K 4.4 4.7 4.9  --   --   --   --   --  5.1  CL 87* 87* 87*  --   --   --   --   --  93*  CO2 33* 27 29  --   --   --   --   --  27  GLUCOSE 144* 94 95  --   --   --   --   --  103*  BUN 10 9 15   --   --   --   --   --  15  CREATININE 0.50 0.50 0.75  --   --   --  0.66  --  0.60  CALCIUM 8.2* 8.3* 8.4*  --   --   --   --   --  8.3*  MG 1.6* 1.8  --   --   --   --   --   --   --   PHOS  --   --  4.4  --   --   --   --   --   --   < > = values in this interval not displayed.  Liver Function Tests:  Recent Labs Lab 06/01/15 0514 06/05/15 0603  AST  --  160*  ALT  --  89*  ALKPHOS  --  84  BILITOT  --  0.7  PROT  --  6.0*  ALBUMIN 2.8*  2.8*   No results for input(s): LIPASE, AMYLASE in the last 168 hours. No results for input(s): AMMONIA in the last 168 hours.  CBC:  Recent Labs Lab 06/01/15 0514 06/05/15 0603  WBC 5.7 3.7  HGB 13.0 12.5  HCT 39.1 37.6  MCV 88.1 90.1  PLT 150 135*    Cardiac Enzymes: No results for input(s): CKTOTAL, CKMB, CKMBINDEX, TROPONINI in the last 168 hours.  BNP: Invalid input(s): POCBNP  CBG: No results for input(s): GLUCAP in the last 168 hours.  Microbiology: Results for orders placed or performed during the hospital encounter of 05/22/15  Rapid Influenza A&B Antigens ([facility] only)  Status: None   Collection Time: 05/22/15  8:44 AM  Result Value Ref Range Status   Influenza A ([facility]) NEGATIVE NEGATIVE Final   Influenza B ([facility]) NEGATIVE NEGATIVE Final  Blood culture (routine x 2)     Status: None   Collection Time: 05/22/15  8:44 AM  Result Value Ref Range Status   Specimen Description BLOOD LEFT HAND  Final   Special Requests BOTTLES DRAWN AEROBIC AND ANAEROBIC  1CC  Final   Culture NO GROWTH 8 DAYS  Final   Report Status 05/30/2015 FINAL  Final  Blood culture (routine x 2)     Status: None   Collection Time: 05/22/15  8:44 AM  Result Value Ref Range Status   Specimen Description BLOOD LEFT WRIST  Final   Special Requests BOTTLES DRAWN AEROBIC AND ANAEROBIC  1CC  Final   Culture NO GROWTH 8 DAYS  Final   Report Status 05/30/2015 FINAL  Final  Urine culture     Status: Abnormal   Collection Time: 05/22/15 11:31 AM  Result Value Ref Range Status   Specimen Description URINE, RANDOM  Final   Special Requests NONE  Final   Culture >=100,000 COLONIES/mL ESCHERICHIA COLI (A)  Final   Report Status 05/24/2015 FINAL  Final   Organism ID, Bacteria ESCHERICHIA COLI (A)  Final      Susceptibility   Escherichia coli - MIC*    AMPICILLIN >=32 RESISTANT Resistant     CEFAZOLIN 8 SENSITIVE Sensitive     CEFTRIAXONE <=1 SENSITIVE Sensitive     CIPROFLOXACIN >=4 RESISTANT  Resistant     GENTAMICIN <=1 SENSITIVE Sensitive     IMIPENEM <=0.25 SENSITIVE Sensitive     NITROFURANTOIN <=16 SENSITIVE Sensitive     TRIMETH/SULFA <=20 SENSITIVE Sensitive     AMPICILLIN/SULBACTAM 4 SENSITIVE Sensitive     PIP/TAZO <=4 SENSITIVE Sensitive     Extended ESBL NEGATIVE Sensitive     * >=100,000 COLONIES/mL ESCHERICHIA COLI  MRSA PCR Screening     Status: None   Collection Time: 05/22/15  1:19 PM  Result Value Ref Range Status   MRSA by PCR NEGATIVE NEGATIVE Final    Comment:        The GeneXpert MRSA Assay (FDA approved for NASAL specimens only), is one component of a comprehensive MRSA colonization surveillance program. It is not intended to diagnose MRSA infection nor to guide or monitor treatment for MRSA infections.   CULTURE, BLOOD (ROUTINE X 2) w Reflex to PCR ID Panel     Status: None   Collection Time: 05/31/15  9:08 PM  Result Value Ref Range Status   Specimen Description BLOOD RIGHT WRIST  Final   Special Requests BOTTLES DRAWN AEROBIC AND ANAEROBIC  Final   Culture NO GROWTH 5 DAYS  Final   Report Status 06/05/2015 FINAL  Final  CULTURE, BLOOD (ROUTINE X 2) w Reflex to PCR ID Panel     Status: None   Collection Time: 05/31/15  9:10 PM  Result Value Ref Range Status   Specimen Description BLOOD RIGHT HAND  Final   Special Requests BOTTLES DRAWN AEROBIC AND ANAEROBIC  Final   Culture NO GROWTH 5 DAYS  Final   Report Status 06/05/2015 FINAL  Final  Urine culture     Status: None   Collection Time: 06/01/15 12:59 AM  Result Value Ref Range Status   Specimen Description URINE, RANDOM  Final   Special Requests NONE  Final   Culture NO GROWTH 1  DAY  Final   Report Status 06/02/2015 FINAL  Final    Coagulation Studies: No results for input(s): LABPROT, INR in the last 72 hours.  Urinalysis: No results for input(s): COLORURINE, LABSPEC, PHURINE, GLUCOSEU, HGBUR, BILIRUBINUR, KETONESUR, PROTEINUR, UROBILINOGEN, NITRITE, LEUKOCYTESUR in the  last 72 hours.  Invalid input(s): APPERANCEUR    Imaging: No results found.   Medications:     . antiseptic oral rinse  7 mL Mouth Rinse BID  . budesonide (PULMICORT) nebulizer solution  0.25 mg Nebulization BID  . enoxaparin (LOVENOX) injection  40 mg Subcutaneous Q24H  . feeding supplement (ENSURE ENLIVE)  237 mL Oral BID BM  . furosemide  20 mg Oral Daily  . ipratropium-albuterol  3 mL Nebulization TID  . LORazepam  1 mg Intravenous Daily  . metoprolol tartrate  25 mg Oral Q6H  . OLANZapine zydis  5 mg Oral QHS  . oseltamivir  75 mg Oral BID  . phenytoin  300 mg Oral QHS  . potassium chloride  20 mEq Oral Daily  . sodium chloride flush  3 mL Intravenous Q12H  . sodium chloride  1 g Oral BID WC  . tamsulosin  0.4 mg Oral Daily   acetaminophen **OR** [DISCONTINUED] acetaminophen, albuterol, haloperidol lactate, hydrALAZINE, metoprolol, ondansetron **OR** ondansetron (ZOFRAN) IV, sodium chloride flush  Assessment/ Plan:  75 y.o. female with past medical history of COPD, hypertension, seizure disorder, peripheral neuropathy, anxiety, depression, history of psychogenic polydipsia who presented to [facility] with mechanical fall. Patient known to [place] from prior admissions as we saw her for hyponatremia in 2014.  1.  Hyponatremia, with prior hx of psychogenic polydipsia: sodium 125.  - fluid restriction: education provided to patient again today. Discussed with nursing staff.  - salt tabs - furosemide  - tolvaptan, was given on 4/21 and 4/22  2. Hypokalemia: now resolved. - discontinue potassium supplement  3.  Urinary Tract Infection: E Coli - cefepime course completed  4. Acute exacerbation of COPD: with positive influenza B.  - supportive care. Oseltamivir.     LOS: 14 Kipp Shank 4/23/20179:49 AM

## 2015-06-05 NOTE — Progress Notes (Signed)
[employer] - [place] at [facility]   PATIENT NAME: Candace Woods    MR#:  [id number]  DATE OF BIRTH:  1940-09-10  SUBJECTIVE:   Afebrile overnight. Respiratory status improving. Sodium 129 today. No other acute events overnight.  REVIEW OF SYSTEMS:    Review of Systems  Constitutional: Negative for fever, chills and weight loss.  HENT: Negative for congestion.   Eyes: Negative for blurred vision and double vision.  Respiratory: Positive for cough, sputum production and wheezing. Negative for shortness of breath.   Cardiovascular: Negative for chest pain, palpitations, orthopnea, leg swelling and PND.  Gastrointestinal: Negative for nausea, vomiting, abdominal pain, diarrhea, constipation and blood in stool.  Genitourinary: Negative for dysuria, urgency, frequency and hematuria.  Musculoskeletal: Negative for falls.  Neurological: Negative for dizziness, tremors, focal weakness and headaches.  Endo/Heme/Allergies: Does not bruise/bleed easily.  Psychiatric/Behavioral: Negative for depression. The patient does not have insomnia.     Nutrition: Regular Tolerating Diet: Yes Tolerating PT: Evaluation noted   DRUG ALLERGIES:   Allergies  Allergen Reactions  . Aspirin   . Codeine   . Contrast Media [Iodinated Diagnostic Agents] Hives    Hives with contrast media injections  . Penicillins   . Sulfa Antibiotics     VITALS:  Blood pressure 115/52, pulse 84, temperature 97.8 F (36.6 C), temperature source Oral, resp. rate 16, height 5\' 4"  (1.626 m), weight 71.668 kg (158 lb), SpO2 91 %.  PHYSICAL EXAMINATION:   Physical Exam  GENERAL:  75 y.o.-year-old patient lying in the bed in no acute distress.  EYES: Pupils equal, round, reactive to light and accommodation. No scleral icterus. Extraocular muscles intact.  HEENT: Head atraumatic, normocephalic. Oropharynx and nasopharynx clear.  NECK:  Supple, no jugular venous distention. No thyroid enlargement, no  tenderness.  LUNGS: minimal End-expiratory wheezing b/l, no rales, few scattered rhonchi. Not using accessory muscles, no dullness to percussion. CARDIOVASCULAR: S1, S2 normal. No murmurs, rubs, or gallops.  ABDOMEN: Soft, nontender, nondistended. Bowel sounds present. No organomegaly or mass.  EXTREMITIES: No cyanosis, clubbing or edema b/l.    NEUROLOGIC: Cranial nerves II through XII are intact. No focal Motor or sensory deficits b/l. Globally weak, positive torticollis PSYCHIATRIC: The patient is alert and oriented x 3.  SKIN: No obvious rash, lesion, or ulcer.    LABORATORY PANEL:   CBC  Recent Labs Lab 06/05/15 0603  WBC 3.7  HGB 12.5  HCT 37.6  PLT 135*   ------------------------------------------------------------------------------------------------------------------  Chemistries   Recent Labs Lab 05/31/15 0519  06/05/15 0603  NA 123*  < > 129*  K 4.7  < > 5.1  CL 87*  < > 93*  CO2 27  < > 27  GLUCOSE 94  < > 103*  BUN 9  < > 15  CREATININE 0.50  < > 0.60  CALCIUM 8.3*  < > 8.3*  MG 1.8  --   --   AST  --   --  160*  ALT  --   --  89*  ALKPHOS  --   --  84  BILITOT  --   --  0.7  < > = values in this interval not displayed. ------------------------------------------------------------------------------------------------------------------  Cardiac Enzymes No results for input(s): TROPONINI in the last 168 hours. ------------------------------------------------------------------------------------------------------------------  RADIOLOGY:  No results found.   ASSESSMENT AND PLAN:   75 year old female with past medical history of COPD, hypertension, history of seizures, peripheral neuropathy, anxiety/depression, psychogenic polydipsia, hyperlipidemia presented to the hospital due to  acute on chronic respiratory failure with COPD exacerbation.  1. COPD Exacerbation - Stable and much improved. -Appreciate speech eval and no evidence of  aspiration. -Continue duo nebs, Pulmicort nebs.      2. AMS/Encephalopathy - likely due to UTI/Hyponatremia  -Finished treatment for the UTI. Sodium is 129 today. - mental status improving.  Cont. To taper Ativan and will likely d/c tomorrow. Cont. Zyprexa.  Psych following  3. Hyponatremia-patient has a history of psychogenic polydipsia. Sodium 129 today -appreciate nephrology input and continue fluid salt tabs, lasix - s/p Tolvaptain on 4/20, 4/21. -Follow sodium which is improving.   4. Urinary tract infection-secondary to Escherichia coli/resolved and treated  5. History of seizures-continue Dilantin and level improved with bolus.   6. Urinary retention- resolved and cont. Flomax  8. Influenza - continue Tamiflu,afebrile now - improving.   PT eval noted  And likely d/c to SNF early next week.   All the records are reviewed and case discussed with Care Management/Social Workerr. Management plans discussed with the patient, family and they are in agreement.  CODE STATUS: DO NOT RESUSCITATE  DVT Prophylaxis: Lovenox  TOTAL TIME TAKING CARE OF THIS PATIENT: 25 minutes.   POSSIBLE D/C IN 1-2  DAYS, DEPENDING ON CLINICAL CONDITION.   Houston Siren M.D on 06/05/2015 at 12:32 PM  Between 7am to 6pm - Pager - [phone]  After 6pm go to [email or web address] - password [email or web address] [facility]  [place] [facility]  Office  [phone]  CC: Primary care physician; Bobbye Morton, MD

## 2015-06-06 LAB — BASIC METABOLIC PANEL
Anion gap: 8 (ref 5–15)
BUN: 13 mg/dL (ref 6–20)
CHLORIDE: 91 mmol/L — AB (ref 101–111)
CO2: 30 mmol/L (ref 22–32)
Calcium: 8.6 mg/dL — ABNORMAL LOW (ref 8.9–10.3)
Creatinine, Ser: 0.53 mg/dL (ref 0.44–1.00)
GFR calc Af Amer: >60 mL/min (ref >60)
GFR calc non Af Amer: >60 mL/min (ref >60)
GLUCOSE: 110 mg/dL — AB (ref 65–99)
POTASSIUM: 4.8 mmol/L (ref 3.5–5.1)
Sodium: 129 mmol/L — ABNORMAL LOW (ref 135–145)

## 2015-06-06 MED ORDER — MIRTAZAPINE 15 MG PO TBDP
15.0000 mg | ORAL_TABLET | Freq: Every day | ORAL | Status: DC
  Administered 2015-06-06: 15 mg via ORAL
  Filled 2015-06-06 (×2): qty 1

## 2015-06-06 MED ORDER — OLANZAPINE 5 MG PO TBDP
10.0000 mg | ORAL_TABLET | Freq: Every day | ORAL | Status: DC
  Administered 2015-06-06: 10 mg via ORAL
  Filled 2015-06-06: qty 2

## 2015-06-06 NOTE — Care Management Note (Signed)
Case Management Note  Patient Details  Name: Candace Woods MRN: [id number] Date of Birth: 03-19-1940  Subjective/Objective:     Discussed discharge planning with SW who reports that [name] by the physician.                Action/Plan:   Expected Discharge Date:                  Expected Discharge Plan:  Skilled Nursing Facility ([facility])  In-House Referral:     Discharge planning Services  CM Consult  Post Acute Care Choice:    Choice offered to:     DME Arranged:    DME Agency:     HH Arranged:    HH Agency:   ([facility])  Status of Service:  Completed, signed off  Medicare Important Message Given:  Yes Date Medicare IM Given:    Medicare IM give by:    Date Additional Medicare IM Given:    Additional Medicare Important Message give by:     If discussed at Long Length of Stay Meetings, dates discussed:    Additional Comments:  Arwin Bisceglia A, RN 06/06/2015, 11:30 AM

## 2015-06-06 NOTE — Progress Notes (Signed)
[facility] [employer] [facility]   PATIENT NAME: Candace Woods    MR#:  [id number]  DATE OF BIRTH:  April 04, 1940  SUBJECTIVE:   Sodium is 129. Mental status stable. Respiratory status is also stable. By mouth intake is fair.  REVIEW OF SYSTEMS:    Review of Systems  Constitutional: Negative for fever, chills and weight loss.  HENT: Negative for congestion.   Eyes: Negative for blurred vision and double vision.  Respiratory: Positive for cough, sputum production and wheezing. Negative for shortness of breath.   Cardiovascular: Negative for chest pain, palpitations, orthopnea, leg swelling and PND.  Gastrointestinal: Negative for nausea, vomiting, abdominal pain, diarrhea, constipation and blood in stool.  Genitourinary: Negative for dysuria, urgency, frequency and hematuria.  Musculoskeletal: Negative for falls.  Neurological: Negative for dizziness, tremors, focal weakness and headaches.  Endo/Heme/Allergies: Does not bruise/bleed easily.  Psychiatric/Behavioral: Negative for depression. The patient does not have insomnia.     Nutrition: Regular Tolerating Diet: Yes Tolerating PT: Evaluation noted   DRUG ALLERGIES:   Allergies  Allergen Reactions  . Aspirin   . Codeine   . Contrast Media [Iodinated Diagnostic Agents] Hives    Hives with contrast media injections  . Penicillins   . Sulfa Antibiotics     VITALS:  Blood pressure 101/46, pulse 90, temperature 98.6 F (37 C), temperature source Oral, resp. rate 18, height  (1.626 m), weight 71.668 kg (158 lb), SpO2 90 %.  PHYSICAL EXAMINATION:   Physical Exam  GENERAL:  75 y.o.-year-old patient lying in the bed in no acute distress.  EYES: Pupils equal, round, reactive to light and accommodation. No scleral icterus. Extraocular muscles intact.  HEENT: Head atraumatic, normocephalic. Oropharynx and nasopharynx clear.  NECK:  Supple, no jugular venous distention. No thyroid enlargement, no  tenderness.  LUNGS: minimal End-expiratory wheezing b/l, no rales, few scattered rhonchi. Not using accessory muscles, no dullness to percussion. CARDIOVASCULAR: S1, S2 normal. No murmurs, rubs, or gallops.  ABDOMEN: Soft, nontender, nondistended. Bowel sounds present. No organomegaly or mass.  EXTREMITIES: No cyanosis, clubbing or edema b/l.    NEUROLOGIC: Cranial nerves II through XII are intact. No focal Motor or sensory deficits b/l. Globally weak, positive torticollis PSYCHIATRIC: The patient is alert and oriented x 3.  SKIN: No obvious rash, lesion, or ulcer.    LABORATORY PANEL:   CBC  Recent Labs Lab 06/05/15 0603  WBC 3.7  HGB 12.5  HCT 37.6  PLT 135*   ------------------------------------------------------------------------------------------------------------------  Chemistries   Recent Labs Lab 05/31/15 0519  06/05/15 0603 06/06/15 0504  NA 123*  < > 129* 129*  K 4.7  < > 5.1 4.8  CL 87*  < > 93* 91*  CO2 27  < > 27 30  GLUCOSE 94  < > 103* 110*  BUN 9  < > 15 13  CREATININE 0.50  < > 0.60 0.53  CALCIUM 8.3*  < > 8.3* 8.6*  MG 1.8  --   --   --   AST  --   --  160*  --   ALT  --   --  89*  --   ALKPHOS  --   --  84  --   BILITOT  --   --  0.7  --   < > = values in this interval not displayed. ------------------------------------------------------------------------------------------------------------------  Cardiac Enzymes No results for input(s): TROPONINI in the last 168 hours. ------------------------------------------------------------------------------------------------------------------  RADIOLOGY:  No results found.   ASSESSMENT  AND PLAN:   75 year old female with past medical history of COPD, hypertension, history of seizures, peripheral neuropathy, anxiety/depression, psychogenic polydipsia, hyperlipidemia presented to the hospital due to acute on chronic respiratory failure with COPD exacerbation.  1. COPD Exacerbation - Stable and much  improved. -Appreciate speech eval and no evidence of aspiration. -Continue duo nebs, Pulmicort nebs.      2. AMS/Encephalopathy - likely due to UTI/Hyponatremia  -Finished treatment for the UTI. Sodium is 129 and stable. - mental status improving.  D/c Ativan. Cont. Zyprexa.  Psych following and appreciate input.   3. Hyponatremia-patient has a history of psychogenic polydipsia. Sodium 129 today -appreciate nephrology input and continue fluid restriction, salt tabs, lasix - s/p Tolvaptain on 4/20, 4/21.  4. Urinary tract infection-secondary to Escherichia coli/resolved and treated  5. History of seizures-continue Dilantin and level improved with bolus.   6. Urinary retention- resolved and cont. Flomax  8. Influenza - continue Tamiflu,afebrile now and stable.   D/c to SNF tomorrow.     All the records are reviewed and case discussed with Care Management/Social Workerr. Management plans discussed with the patient, family and they are in agreement.  CODE STATUS: DO NOT RESUSCITATE  DVT Prophylaxis: Lovenox  TOTAL TIME TAKING CARE OF THIS PATIENT: 25 minutes.   POSSIBLE D/C IN 1-2  DAYS, DEPENDING ON CLINICAL CONDITION.   Houston SirenSAINANI,Rosali Augello J M.D on 06/06/2015 at 3:01 PM  Between 7am to 6pm - Pager - [phone]  After 6pm go to [email or web address] - password [email or web address] [facility]  [facility]  Office  [phone]  CC: Primary care physician; Bobbye MortonSHARON A REILLY, MD

## 2015-06-06 NOTE — Progress Notes (Signed)
Physical Therapy Treatment Patient Details Name: Candace Woods MRN: [id number] DOB: 27-Jun-1940 Today's Date: 06/06/2015    History of Present Illness Candace Woods is a 75 y.o. female with a known history of COPD non-oxygen requiring presenting the hospital after mechanical fall at home unable to rise off floor secondary to weakness. On arrival to emergency department noted to be hypoxic with O2 saturations in the mid 70s on room air and had improvement of oxygenation requiring nonrebreather however given increased work of breathing placed on BiPAP therapy. O2 saturations remained in the high 80s. She complains of nonproductive cough shortness of breath however, states this is her baseline. Family reports 2 falls in the last 12 months. Pt is still confused by improved from prior days. At baseline she is AOx4. [facility] where she [facility] on T[facility]. She has a HH Aid 7d/wk for 3 hours/day.    PT Comments    Pt refuses any bed mobility, transfers, or attempts at ambulation despite repeated encouragement from therapist. She is disoriented to situation and conversation is intermittently confused. Pt does agree to bed exercises but keeps her eyes closed throughout majority of session. She demonstrates adequate LE strength for attempted transfers but is unwilling. Will continue to attempt to progress toward functional mobility in future sessions as confusion improves. Pt will benefit from skilled PT services to address deficits in strength, balance, and mobility in order to return to full function at home.    Follow Up Recommendations  SNF     Equipment Recommendations  None recommended by PT    Recommendations for Other Services       Precautions / Restrictions Precautions Precautions: Fall Restrictions Weight Bearing Restrictions: No    Mobility  Bed Mobility                  Transfers                    Ambulation/Gait                 Stairs            Wheelchair Mobility    Modified Rankin (Stroke Patients Only)       Balance                                    Cognition Arousal/Alertness: Lethargic Behavior During Therapy: Agitated Overall Cognitive Status: Impaired/Different from baseline Area of Impairment: Orientation Orientation Level: Disoriented to;Situation (AOx3, disoriented to situation, Partially oriented to place)   Memory: Decreased short-term memory              Exercises General Exercises - Lower Extremity Ankle Circles/Pumps: Strengthening;Both;15 reps;Supine Quad Sets: Strengthening;Both;15 reps;Supine Gluteal Sets: Strengthening;Both;15 reps;Supine Heel Slides: Strengthening;Both;15 reps;Supine Hip ABduction/ADduction: Strengthening;Both;15 reps;Supine;Other (comment) Straight Leg Raises: Strengthening;Both;15 reps;Supine    General Comments        Pertinent Vitals/Pain Pain Assessment: No/denies pain Pain Location: No grimacing or groaning Pain Intervention(s): Monitored during session    Home Living                      Prior Function            PT Goals (current goals can now be found in the care plan section) Acute Rehab PT Goals Patient Stated Goal: Unable to provide. Family would like pt to return home at  discharge PT Goal Formulation: With family Time For Goal Achievement: 06/10/15 Potential to Achieve Goals: Fair Progress towards PT goals: Not progressing toward goals - comment (Will not agree to transfers or ambulation)    Frequency  Min 2X/week    PT Plan Current plan remains appropriate    Co-evaluation             End of Session Equipment Utilized During Treatment: Oxygen Activity Tolerance: Patient limited by lethargy;Patient limited by fatigue;Treatment limited secondary to agitation Patient left: in bed;with call bell/phone within reach;with bed alarm set     Time: 1135-1143 PT Time Calculation (min) (ACUTE ONLY): 8  min  Charges:  $Therapeutic Exercise: 8-22 mins                    G Codes:      Sharalyn InkJason D Huprich PT, DPT   Huprich,Jason 06/06/2015, 11:59 AM

## 2015-06-06 NOTE — Consult Note (Signed)
[facility] Consult   Reason for Consult:  Follow-up note for this 75 year old woman with a history of chronic mental health problems as well as acute delirium. Patient interviewed chart reviewed labs reviewed. Referring Physician:  Hour Patient Identification: Candace Woods MRN:  [id number] Principal Diagnosis: Acute delirium Diagnosis:   Patient Active Problem List   Diagnosis Date Noted  . Acute delirium [R41.0] 05/26/2015  . 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 Seizures ([place]) [R56.9] 05/26/2015  . Psychosis [F29] 05/26/2015  . Acute on chronic respiratory failure with hypoxia (HCC) [J96.21] 05/22/2015    Total Time spent with patient: 20 minutes  Subjective:   Candace Woods is a 75 y.o. female patient admitted with "I think I'm fine".    Update Monday the 24th. Patient interviewed. Chart reviewed. I found the patient in the same position that she seems to always be in. She is laying on her left side with her eyes closed. Barely moving. Looking disheveled. She was awake and responsive when I spoke with her. Most of her responses were irritable. Fairly nonspecific. Not able to really articulate much of a clear plan for the future or really show much understanding of what's going on in the hospital. Not necessarily delirious. Back to her habit of being irritable and blaming other people for trying to help her. Denies that she's having hallucinations. It was unclear from our conversation whether she might be having some confusion at times.  HPI:  Patient interviewed. Chart reviewed. Patient was awake in her room when we came by. Somewhat cooperative. Somewhat irritable but denied any psychiatric problems. Said that she thought her mood was fine. Denied hallucinations. She is a little bit confused. Although she knows she is in the hospital she still doesn't know how long she has been here and couldn't really describe why she was in the hospital. She tells me  several times that she doesn't mess with psychiatrists although I know that she's had chronic mental health problems in the past.  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 Earlier in her hospitalization she was delirious. Responded initially with pulling out of the catatonia and now seems to be getting a little better with appropriate medicine.  Risk to Self: Is patient at risk for suicide?: No Risk to Others:   Prior Inpatient Therapy:   Prior Outpatient Therapy:    Past Medical History:  Past Medical History  Diagnosis Date  . COPD (chronic obstructive pulmonary disease) ([place])   . Hypertension   . Seizures ([place])   . Peripheral neuropathy ([place])   . Torticollis Right  . Anxiety   . Depression   . Psychogenic polydipsia   . MRSA pneumonia ([place])     2014  . Hyperlipemia   . Asthma     Past Surgical History  Procedure Laterality Date  . Abdominal hysterectomy     Family History:  Family History  Problem Relation Age of Onset  . Hypertension Other    Family Psychiatric  History: None reported by her Social History:  History  Alcohol Use No     History  Drug Use No    Social History   Social History  . Marital Status: Widowed    Spouse Name: N/A  . Number of Children: N/A  . Years of Education: N/A   Social History Main Topics  . Smoking status: Current Every Day Smoker -- 2.00 packs/day for 55 years    Types: Cigarettes  .  Smokeless tobacco: None  . Alcohol Use: No  . Drug Use: No  . Sexual Activity: Not Asked   Other Topics Concern  . None   Social History Narrative   Additional Social History:    Allergies:   Allergies  Allergen Reactions  . Aspirin   . Codeine   . Contrast Media [Iodinated Diagnostic Agents] Hives    Hives with contrast media injections  . Penicillins   . Sulfa Antibiotics     Labs:  Results for orders placed or performed during the hospital encounter of 05/22/15 (from the  past 48 hour(s))  CBC     Status: Abnormal   Collection Time: 06/05/15  6:03 AM  Result Value Ref Range   WBC 3.7 3.6 - 11.0 K/uL   RBC 4.17 3.80 - 5.20 MIL/uL   Hemoglobin 12.5 12.0 - 16.0 g/dL   HCT 37.6 35.0 - 47.0 %   MCV 90.1 80.0 - 100.0 fL   MCH 30.0 26.0 - 34.0 pg   MCHC 33.3 32.0 - 36.0 g/dL   RDW 16.5 (H) 11.5 - 14.5 %   Platelets 135 (L) 150 - 440 K/uL  Comprehensive metabolic panel     Status: Abnormal   Collection Time: 06/05/15  6:03 AM  Result Value Ref Range   Sodium 129 (L) 135 - 145 mmol/L   Potassium 5.1 3.5 - 5.1 mmol/L   Chloride 93 (L) 101 - 111 mmol/L   CO2 27 22 - 32 mmol/L   Glucose, Bld 103 (H) 65 - 99 mg/dL   BUN 15 6 - 20 mg/dL   Creatinine, Ser 0.60 0.44 - 1.00 mg/dL   Calcium 8.3 (L) 8.9 - 10.3 mg/dL   Total Protein 6.0 (L) 6.5 - 8.1 g/dL   Albumin 2.8 (L) 3.5 - 5.0 g/dL   AST 160 (H) 15 - 41 U/L   ALT 89 (H) 14 - 54 U/L   Alkaline Phosphatase 84 38 - 126 U/L   Total Bilirubin 0.7 0.3 - 1.2 mg/dL   GFR calc non Af Amer >60 >60 mL/min   GFR calc Af Amer >60 >60 mL/min    Comment: (NOTE) The eGFR has been calculated using the CKD EPI equation. This calculation has not been validated in all clinical situations. eGFR's persistently <60 mL/min signify possible Chronic Kidney Disease.    Anion gap 9 5 - 15  Basic metabolic panel     Status: Abnormal   Collection Time: 06/06/15  5:04 AM  Result Value Ref Range   Sodium 129 (L) 135 - 145 mmol/L   Potassium 4.8 3.5 - 5.1 mmol/L   Chloride 91 (L) 101 - 111 mmol/L   CO2 30 22 - 32 mmol/L   Glucose, Bld 110 (H) 65 - 99 mg/dL   BUN 13 6 - 20 mg/dL   Creatinine, Ser 0.53 0.44 - 1.00 mg/dL   Calcium 8.6 (L) 8.9 - 10.3 mg/dL   GFR calc non Af Amer >60 >60 mL/min   GFR calc Af Amer >60 >60 mL/min    Comment: (NOTE) The eGFR has been calculated using the CKD EPI equation. This calculation has not been validated in all clinical situations. eGFR's persistently <60 mL/min signify possible Chronic  Kidney Disease.    Anion gap 8 5 - 15    Current Facility-Administered Medications  Medication Dose Route Frequency Provider Last Rate Last Dose  . acetaminophen (TYLENOL) tablet 650 mg  650 mg Oral Q6H PRN Lytle Butte, MD   [phone]  mg at 06/05/15 0421  . albuterol (PROVENTIL) (2.5 MG/3ML) 0.083% nebulizer solution 2.5 mg  2.5 mg Nebulization Q3H PRN Wilhelmina Mcardle, MD      . antiseptic oral rinse (CPC / CETYLPYRIDINIUM CHLORIDE 0.05%) solution 7 mL  7 mL Mouth Rinse BID Wilhelmina Mcardle, MD   7 mL at 06/06/15 1000  . budesonide (PULMICORT) nebulizer solution 0.25 mg  0.25 mg Nebulization BID Wilhelmina Mcardle, MD   0.25 mg at 06/06/15 0746  . enoxaparin (LOVENOX) injection 40 mg  40 mg Subcutaneous Q24H Wilhelmina Mcardle, MD   40 mg at 06/06/15 0919  . feeding supplement (ENSURE ENLIVE) (ENSURE ENLIVE) liquid 237 mL  237 mL Oral BID BM Harmeet Singh, MD   237 mL at 06/06/15 1400  . furosemide (LASIX) tablet 20 mg  20 mg Oral Daily Lavonia Dana, MD   20 mg at 06/06/15 0919  . haloperidol lactate (HALDOL) injection 1-2 mg  1-2 mg Intravenous Q3H PRN Wilhelmina Mcardle, MD   2 mg at 06/02/15 2350  . hydrALAZINE (APRESOLINE) injection 10-40 mg  10-40 mg Intravenous Q4H PRN Wilhelmina Mcardle, MD   40 mg at 05/26/15 1216  . ipratropium-albuterol (DUONEB) 0.5-2.5 (3) MG/3ML nebulizer solution 3 mL  3 mL Nebulization TID Wilhelmina Mcardle, MD   3 mL at 06/06/15 1401  . metoprolol (LOPRESSOR) injection 2.5-5 mg  2.5-5 mg Intravenous Q3H PRN Wilhelmina Mcardle, MD   5 mg at 05/26/15 3500  . metoprolol tartrate (LOPRESSOR) tablet 25 mg  25 mg Oral Q6H Vipul Shah, MD   25 mg at 06/06/15 0919  . mirtazapine (REMERON SOL-TAB) disintegrating tablet 15 mg  15 mg Oral QHS Gonzella Lex, MD      . OLANZapine zydis (ZYPREXA) disintegrating tablet 10 mg  10 mg Oral QHS Gonzella Lex, MD      . ondansetron [facility]) tablet 4 mg  4 mg Oral Q6H PRN Lytle Butte, MD       Or  . ondansetron [facility]) injection 4 mg  4 mg  Intravenous Q6H PRN Lytle Butte, MD   4 mg at 06/01/15 0943  . oseltamivir (TAMIFLU) capsule 75 mg  75 mg Oral BID Theodoro Grist, MD   75 mg at 06/06/15 0919  . oxymetazoline (AFRIN) 0.05 % nasal spray 1 spray  1 spray Each Nare BID Henreitta Leber, MD   1 spray at 06/06/15 [phone]  . phenytoin (DILANTIN) ER capsule 300 mg  300 mg Oral QHS Henreitta Leber, MD   300 mg at 06/05/15 2211  . sodium chloride flush (NS) 0.9 % injection 10-40 mL  10-40 mL Intracatheter PRN Dustin Flock, MD   10 mL at 05/28/15 0837  . sodium chloride flush (NS) 0.9 % injection 3 mL  3 mL Intravenous Q12H Lytle Butte, MD   3 mL at 06/06/15 0932  . sodium chloride tablet 1 g  1 g Oral BID WC Lavonia Dana, MD   1 g at 06/06/15 0918  . tamsulosin (FLOMAX) capsule 0.4 mg  0.4 mg Oral Daily Henreitta Leber, MD   0.4 mg at 06/06/15 0919    Musculoskeletal: Strength & Muscle Tone: decreased Gait & Station: unable to stand Patient leans: N/A  Psychiatric Specialty Exam: Review of Systems  Constitutional: Negative.   HENT: Negative.   Eyes: Negative.   Respiratory: Negative.   Cardiovascular: Negative.   Gastrointestinal: Negative.   Musculoskeletal: Positive for myalgias and falls.  Skin: Negative.   Neurological: Negative.   Psychiatric/Behavioral: Negative for depression, suicidal ideas, hallucinations, memory loss and substance abuse. The patient is not nervous/anxious and does not have insomnia.     Blood pressure 101/46, pulse 90, temperature 98.6 F (37 C), temperature source Oral, resp. rate 18, height 5' 4"  (1.626 m), weight 71.668 kg (158 lb), SpO2 90 %.Body mass index is 27.11 kg/(m^2).  General Appearance: Disheveled  Eye Contact::  Minimal  Speech:  Slow  Volume:  Decreased  Mood:  Anxious  Affect:  Depressed  Thought Process:  Tangential  Orientation:  Full (Time, Place, and Person)  Thought Content:  Negative  Suicidal Thoughts:  No  Homicidal Thoughts:  No  Memory:  Immediate;    Fair Recent;   Fair Remote;   Fair  Judgement:  Fair  Insight:  Fair  Psychomotor Activity:  Decreased  Concentration:  Fair  Recall:  [employer] of Knowledge:Fair  Language: Fair  Akathisia:  No  Handed:  Right  AIMS (if indicated):     Assets:  Agricultural consultant Housing Social Support  ADL's:  Impaired  Cognition: Impaired,  Mild  Sleep:      Treatment Plan Summary: Daily contact with patient to assess and evaluate symptoms and progress in treatment, Medication management and Plan I recognize that she has multiple severe medical problems but it seems to me that she is making very little progress. She tells me that she's not getting up out of bed and that she is going a little bit. Affect looks irritable and blunted. Doesn't seem to be very hopeful and doesn't seem to be changing much as the hospital course goes along. Based on her prior history I am going to increase her antipsychotic to 10 mg at night and I'm also going to add 15 mg of mirtazapine for depression at night. Hopefully this will not be over sedating and she will get used to it fairly quickly. I will continue to follow-up daily.  Disposition: Supportive therapy provided about ongoing stressors.  Alethia Berthold, MD 06/06/2015 5:20 PM

## 2015-06-07 MED ORDER — SODIUM CHLORIDE 1 G PO TABS: 1.0000 g | ORAL_TABLET | Freq: Two times a day (BID) | ORAL | Status: AC

## 2015-06-07 MED ORDER — METOPROLOL TARTRATE 25 MG PO TABS: 25.0000 mg | ORAL_TABLET | Freq: Two times a day (BID) | ORAL | Status: AC

## 2015-06-07 MED ORDER — ALPRAZOLAM 0.5 MG PO TABS: 0.5000 mg | ORAL_TABLET | Freq: Two times a day (BID) | ORAL | Status: AC | PRN

## 2015-06-07 MED ORDER — FUROSEMIDE 20 MG PO TABS: 20.0000 mg | ORAL_TABLET | Freq: Every day | ORAL | Status: AC

## 2015-06-07 MED ORDER — MIRTAZAPINE 15 MG PO TBDP: 15.0000 mg | ORAL_TABLET | Freq: Every day | ORAL | Status: AC

## 2015-06-07 NOTE — Progress Notes (Signed)
Bedside report received , patient lying in the bed, denies pain or discomfort, mood calm no abnormal behavior noted. Possible dicharged to snf today awaiting for bed at this time. Med rec completed by pharmacie  As per md request

## 2015-06-07 NOTE — Progress Notes (Signed)
Physical Therapy Treatment Patient Details Name: Candace Woods MRN: [id number] DOB: [birth year] Today's Date: 06/07/2015    History of Present Illness Candace Woods is a 75 y.o. female with a known history of COPD non-oxygen requiring presenting the hospital after mechanical fall at home unable to rise off floor secondary to weakness. On arrival to emergency department noted to be hypoxic with O2 saturations in the mid 70s on room air and had improvement of oxygenation requiring nonrebreather however given increased work of breathing placed on BiPAP therapy. O2 saturations remained in the high 80s. She complains of nonproductive cough shortness of breath however, states this is her baseline. Family reports 2 falls in the last 12 months. Pt is still confused by improved from prior days. At baseline she is AOx4. Pt is a [facility] where she goes on Tuesdays. She has a HH Aid 7d/wk for 3 hours/day.    PT Comments    Pt in bed stating she needs to be cleaned.  Incontinent of urine.  Pt was able to roll left and right for care with use of rail and hold position without physical assist.  O2 sats 86-88% during task with HR 100-103.  Pt voiced frustration on need to go to rehab.  Discussed reasons and expectations during short term rehab.  Pt continued to voice that she did not understand why she could not return home.  Pt encouraged to sit edge of bed for grooming tasks as she wanted her hair brushed.  Pt refused stating "I will fall"  Neck ROM and gentle stretch provided during grooming tasks.  Refused further tx.  Encouraged and educated on proper breathing techniques to increase O2 sats but she continued to mouth breath for much of the time.     Follow Up Recommendations  SNF     Equipment Recommendations  None recommended by PT    Recommendations for Other Services       Precautions / Restrictions Precautions Precautions: Fall Restrictions Weight Bearing Restrictions: No     Mobility  Bed Mobility Overal bed mobility: Needs Assistance Bed Mobility: Rolling Rolling: Supervision (with rail)            Transfers                    Ambulation/Gait                 Stairs            Wheelchair Mobility    Modified Rankin (Stroke Patients Only)       Balance                                    Cognition Arousal/Alertness: Awake/alert Behavior During Therapy: WFL for tasks assessed/performed Overall Cognitive Status: Impaired/Different from baseline                      Exercises      General Comments        Pertinent Vitals/Pain Pain Assessment: No/denies pain    Home Living                      Prior Function            PT Goals (current goals can now be found in the care plan section) Progress towards PT goals: Not progressing toward goals - comment  Frequency  Min 2X/week    PT Plan Current plan remains appropriate    Co-evaluation             End of Session Equipment Utilized During Treatment: Oxygen Activity Tolerance: Patient limited by fatigue Patient left: in bed;with call bell/phone within reach;with bed alarm set     Time: 1035-1051 PT Time Calculation (min) (ACUTE ONLY): 16 min  Charges:  $Therapeutic Activity: 8-22 mins                    G Codes:      Danielle Dess, PTA 06/07/2015, 11:00 AM

## 2015-06-07 NOTE — Clinical Social Work Note (Signed)
Patient to discharge today. CSW has contacted [facility] and faxed them her discharge information. [facility] is sending someone to transport patient at 2:30 today. Patient's nurse is aware. Patient's nurse to call patient's daughter to notify. Discharge information sent to [facility] where patient will be discharging to.  York SpanielMonica Nilsa Macht MSW,LCSW [phone]

## 2015-06-07 NOTE — Progress Notes (Signed)
Patient being discharged to [facility], picc line and in removed, report called  to receiving nurse , patient daughter notifed about the discharge. Patient waiting for transport

## 2015-06-07 NOTE — Clinical Social Work Placement (Signed)
   CLINICAL SOCIAL WORK PLACEMENT  NOTE  Date:  06/07/2015  Patient Details  Name: Candace Woods MRN: [id number] Date of Birth: 12-24-1940  Clinical Social Work is seeking post-discharge placement for this patient at the Skilled  Nursing Facility level of care (*CSW will initial, date and re-position this form in  chart as items are completed):  Yes   Patient/family provided with [facility] Clinical Social Work Department's list of facilities offering this level of care within the geographic area requested by the patient (or if unable, by the patient's family).  Yes ([facility] dictates due to contract which facility patient is eligible to go to)   Patient/family informed of their freedom to choose among providers that offer the needed level of care, that participate in Medicare, Medicaid or managed care program needed by the patient, have an available bed and are willing to accept the patient.  No   Patient/family informed of [facility] ownership interest [facility] and [facility], as well as of the fact that they are under no obligation to receive care at these facilities.  PASRR submitted to EDS on 06/03/15     PASRR number received on 06/03/15     Existing PASRR number confirmed on       FL2 transmitted to all facilities in geographic area requested by pt/family on 06/03/15     FL2 transmitted to all facilities within larger geographic area on       Patient informed that his/her managed care company has contracts with or will negotiate with certain facilities, including the following:        Yes   Patient/family informed of bed offers received.  Patient chooses bed at  [facility])     Physician recommends and patient chooses bed at  [facility](S[facility])    Patient to be transferred to  ([facility]) on 06/07/15.  Patient to be transferred to facility by  ([facility])     Patient family notified on 06/07/15 of transfer.  Name of family member notified:   (Daughter)      PHYSICIAN       Additional Comment:    _______________________________________________ York SpanielMonica Chipper Koudelka, LCSW 06/07/2015, 2:00 PM

## 2015-06-07 NOTE — Discharge Summary (Signed)
[facility]   PATIENT NAME: Candace Woods    MR#:  [id number]  DATE OF BIRTH:  1940-09-25  DATE OF ADMISSION:  05/22/2015 ADMITTING PHYSICIAN: Wyatt Haste, MD  DATE OF DISCHARGE: 06/07/2015  PRIMARY CARE PHYSICIAN: Bobbye Morton, MD    ADMISSION DIAGNOSIS:  Edema [R60.9] Dehydration [E86.0] Respiratory failure (HCC) [J96.90] Hypercarbia [R06.89] Hyponatremia [E87.1] Hypoxia [R09.02] Fall, initial encounter [W19.XXXA] Acute congestive heart failure, unspecified congestive heart failure type (HCC) [I50.9]  DISCHARGE DIAGNOSIS:  Principal Problem:   Acute delirium Active Problems:   Acute on chronic respiratory failure with hypoxia (HCC)   Psychogenic polydipsia   Seizures (HCC)   Psychosis   SECONDARY DIAGNOSIS:   Past Medical History  Diagnosis Date  . COPD (chronic obstructive pulmonary disease) (HCC)   . Hypertension   . Seizures (HCC)   . Peripheral neuropathy (HCC)   . Torticollis Right  . Anxiety   . Depression   . Psychogenic polydipsia   . MRSA pneumonia (HCC)     2014  . Hyperlipemia   . Asthma     HOSPITAL COURSE:   75 year old female with past medical history of COPD, hypertension, history of seizures, peripheral neuropathy, anxiety/depression, psychogenic polydipsia, hyperlipidemia presented to the hospital due to acute on chronic respiratory failure with COPD exacerbation.  1. COPD Exacerbation - patient was admitted to the hospital with respiratory failure secondary to COPD exacerbation. Initially treated with IV steroids, BiPAP support, empiric antibiotics, DuoNeb nebs and Pulmicort nebs. -Patient's IV steroids were tapered quickly as she developed some encephalopathy and altered mental status secondary to it. After aggressive therapy patient's clinical symptoms have improved. She still has some minimal wheezing and bronchospasm but it's improved with DuoNeb simple nebs. -She is being discharged to a skilled  nursing facility on Advair, Albuterol/duonebs  as needed.   2. AMS/Encephalopathy - she developed significant agitation and encephalopathy during the hospital. This was multifactorial in nature related to steroids binding underlying UTI and also hyponatremia. -Patient was seen by psychiatry and started on scheduled Ativan and also Zyprexa for agitation. After weaning off her IV steroids, treating her urinary tract infection and also correcting her sodium patient's mental status now is much improved and is close to baseline. She presently has no evidence of agitation. -She is being discharged on Zyprexa, Remeron as per psychiatry. Her sodium is stable at 129. Her UTI has been adequately treated and she is currently afebrile and stable. She was on scheduled Ativan which has been tapered and now discontinued.    3. Hyponatremia-patient had fluctuating sodium levels throughout the hospital course. She was treated with IV fluids initially and then switched to fluid restriction and also given Tolvaptan.   -Patient was seen by nephrology during the hospital course. Her sodium has improved and is currently stable at 129 and she is clinically asymptomatic. As per nephrology she is now being discharged on sodium tablets, Lasix, fluid restriction at 1200 cc per day.  4. Urinary tract infection-secondary to Escherichia coli.  This has been adequately treated with IV ceftriaxone and now resolved.  5. History of seizures-patient had no acute seizures while in the hospital. Her Dilantin level was subtherapeutic she was given extra bolus. She will not resume her Dilantin at bedtime and levels can be followed as an outpatient.   6. Urinary retention-patient was started on some Flomax and maintained on a Foley catheter for a bit which was then discontinued and she presently has no evidence  of urinary retention. She will continue the Flomax.  7. Influenza -patient developed some fevers in the hospital and the source  was unclear initially but her influenza PCR was positive. She received 5 days of Tamiflu and has improved. She is currently afebrile and her respiratory status is stable as mentioned above.  8. Depression-patient will continue her Zoloft. Patient was value by psychiatry. She has no evidence of suicidal or homicidal ideations. She will continue her medications and follow-up with her psychiatrist as an outpatient.   DISCHARGE CONDITIONS:   Stable  CONSULTS OBTAINED:  Treatment Team:  Mady Haagensen, MD Audery Amel, MD  DRUG ALLERGIES:   Allergies  Allergen Reactions  . Aspirin   . Codeine   . Contrast Media [Iodinated Diagnostic Agents] Hives    Hives with contrast media injections  . Penicillins   . Sulfa Antibiotics     DISCHARGE MEDICATIONS:   Current Discharge Medication List    START taking these medications   Details  metoprolol tartrate (LOPRESSOR) 25 MG tablet Take 1 tablet (25 mg total) by mouth 2 (two) times daily.    mirtazapine (REMERON SOL-TAB) 15 MG disintegrating tablet Take 1 tablet (15 mg total) by mouth at bedtime.    sodium chloride 1 g tablet Take 1 tablet (1 g total) by mouth 2 (two) times daily with a meal.      CONTINUE these medications which have CHANGED   Details  ALPRAZolam (XANAX) 0.5 MG tablet Take 1 tablet (0.5 mg total) by mouth 2 (two) times daily as needed for anxiety. Qty: 30 tablet, Refills: 0    furosemide (LASIX) 20 MG tablet Take 1 tablet (20 mg total) by mouth daily. Qty: 30 tablet      CONTINUE these medications which have NOT CHANGED   Details  acetaminophen (TYLENOL) 650 MG CR tablet Take 650 mg by mouth every 12 (twelve) hours.    albuterol (PROVENTIL HFA;VENTOLIN HFA) 108 (90 Base) MCG/ACT inhaler Inhale 2 puffs into the lungs every 6 (six) hours as needed for wheezing or shortness of breath.    albuterol (PROVENTIL) (2.5 MG/3ML) 0.083% nebulizer solution Take 2.5 mg by nebulization every 4 (four) hours as needed for  wheezing or shortness of breath.    baclofen (LIORESAL) 10 MG tablet Take 10 mg by mouth 2 (two) times daily. PT ALSO TAKES 1 TABLET AT BEDTIME PRN    fexofenadine (ALLEGRA) 180 MG tablet Take 180 mg by mouth daily.    Fluticasone-Salmeterol (ADVAIR) 250-50 MCG/DOSE AEPB Inhale 1 puff into the lungs 2 (two) times daily.    gabapentin (NEURONTIN) 100 MG capsule Take 100 mg by mouth daily.    guaiFENesin-dextromethorphan (ROBITUSSIN DM) 100-10 MG/5ML syrup Take 10 mLs by mouth every 6 (six) hours as needed for cough.    ipratropium-albuterol (DUONEB) 0.5-2.5 (3) MG/3ML SOLN Take 3 mLs by nebulization every 4 (four) hours as needed (wheeze).    OLANZapine zydis (ZYPREXA) 5 MG disintegrating tablet Take 5 mg by mouth at bedtime.    phenytoin (DILANTIN) 300 MG ER capsule Take 300 mg by mouth at bedtime.    ranitidine (ZANTAC) 150 MG tablet Take 150 mg by mouth 2 (two) times daily.    sennosides-docusate sodium (SENOKOT-S) 8.6-50 MG tablet Take 1 tablet by mouth daily.    sertraline (ZOLOFT) 100 MG tablet Take 100 mg by mouth daily.    simvastatin (ZOCOR) 20 MG tablet Take 20 mg by mouth daily at 6 PM.    sodium chloride (OCEAN) 0.65 %  nasal spray Place 1 spray into the nose as needed. For congestion    tamsulosin (FLOMAX) 0.4 MG CAPS capsule Take 0.4 mg by mouth daily.    tiotropium (SPIRIVA) 18 MCG inhalation capsule Place 18 mcg into inhaler and inhale daily.    zolpidem (AMBIEN) 5 MG tablet Take 5 mg by mouth at bedtime as needed for sleep.      STOP taking these medications     atenolol (TENORMIN) 25 MG tablet          DISCHARGE INSTRUCTIONS:   DIET:  Cardiac diet  DISCHARGE CONDITION:  Stable  ACTIVITY:  Activity as tolerated  OXYGEN:  Home Oxygen: Yes.     Oxygen Delivery: 3 liters/min via Patient connected to nasal cannula oxygen  DISCHARGE LOCATION:  nursing home   If you experience worsening of your admission symptoms, develop shortness of breath,  life threatening emergency, suicidal or homicidal thoughts you must seek medical attention immediately by calling 911 or calling your MD immediately  if symptoms less severe.  You Must read complete instructions/literature along with all the possible adverse reactions/side effects for all the Medicines you take and that have been prescribed to you. Take any new Medicines after you have completely understood and accpet all the possible adverse reactions/side effects.   Please note  You were cared for by a hospitalist during your hospital stay. If you have any questions about your discharge medications or the care you received while you were in the hospital after you are discharged, you can call the unit and asked to speak with the hospitalist on call if the hospitalist that took care of you is not available. Once you are discharged, your primary care physician will handle any further medical issues. Please note that NO REFILLS for any discharge medications will be authorized once you are discharged, as it is imperative that you return to your primary care physician (or establish a relationship with a primary care physician if you do not have one) for your aftercare needs so that they can reassess your need for medications and monitor your lab values.     Today   Wheezing bronchospasm has improved. No complaints presently. Sodium stable at 129.  VITAL SIGNS:  Blood pressure 106/65, pulse 102, temperature 99 F (37.2 C), temperature source Oral, resp. rate 16, height 5\' 4"  (1.626 m), weight 71.668 kg (158 lb), SpO2 92 %.  I/O:   Intake/Output Summary (Last 24 hours) at 06/07/15 1128 Last data filed at 06/06/15 2320  Gross per 24 hour  Intake      0 ml  Output      0 ml  Net      0 ml    PHYSICAL EXAMINATION:  GENERAL:  75 y.o.-year-old patient lying in the bed with no acute distress.  EYES: Pupils equal, round, reactive to light and accommodation. No scleral icterus. Extraocular muscles  intact.  HEENT: Head atraumatic, normocephalic. Oropharynx and nasopharynx clear.  NECK:  Supple, no jugular venous distention. No thyroid enlargement, no tenderness.  LUNGS: Normal breath sounds bilaterally, minimal end expiratory wheezing, No rales,rhonchi. No use of accessory muscles of respiration.  CARDIOVASCULAR: S1, S2 normal. No murmurs, rubs, or gallops.  ABDOMEN: Soft, non-tender, non-distended. Bowel sounds present. No organomegaly or mass.  EXTREMITIES: No pedal edema, cyanosis, or clubbing.  NEUROLOGIC: Cranial nerves II through XII are intact. No focal motor or sensory defecits b/l. + Torticollis.  PSYCHIATRIC: The patient is alert and oriented x 3.  SKIN: No  obvious rash, lesion, or ulcer.   DATA REVIEW:   CBC  Recent Labs Lab 06/05/15 0603  WBC 3.7  HGB 12.5  HCT 37.6  PLT 135*    Chemistries   Recent Labs Lab 06/05/15 0603 06/06/15 0504  NA 129* 129*  K 5.1 4.8  CL 93* 91*  CO2 27 30  GLUCOSE 103* 110*  BUN 15 13  CREATININE 0.60 0.53  CALCIUM 8.3* 8.6*  AST 160*  --   ALT 89*  --   ALKPHOS 84  --   BILITOT 0.7  --     Cardiac Enzymes No results for input(s): TROPONINI in the last 168 hours.  Microbiology Results  Results for orders placed or performed during the hospital encounter of 05/22/15  Rapid Influenza A&B Antigens ([facility] only)     Status: None   Collection Time: 05/22/15  8:44 AM  Result Value Ref Range Status   Influenza A ([facility]) NEGATIVE NEGATIVE Final   Influenza B ([facility]) NEGATIVE NEGATIVE Final  Blood culture (routine x 2)     Status: None   Collection Time: 05/22/15  8:44 AM  Result Value Ref Range Status   Specimen Description BLOOD LEFT HAND  Final   Special Requests BOTTLES DRAWN AEROBIC AND ANAEROBIC  1CC  Final   Culture NO GROWTH 8 DAYS  Final   Report Status 05/30/2015 FINAL  Final  Blood culture (routine x 2)     Status: None   Collection Time: 05/22/15  8:44 AM  Result Value Ref Range Status   Specimen Description  BLOOD LEFT WRIST  Final   Special Requests BOTTLES DRAWN AEROBIC AND ANAEROBIC  1CC  Final   Culture NO GROWTH 8 DAYS  Final   Report Status 05/30/2015 FINAL  Final  Urine culture     Status: Abnormal   Collection Time: 05/22/15 11:31 AM  Result Value Ref Range Status   Specimen Description URINE, RANDOM  Final   Special Requests NONE  Final   Culture >=100,000 COLONIES/mL ESCHERICHIA COLI (A)  Final   Report Status 05/24/2015 FINAL  Final   Organism ID, Bacteria ESCHERICHIA COLI (A)  Final      Susceptibility   Escherichia coli - MIC*    AMPICILLIN >=32 RESISTANT Resistant     CEFAZOLIN 8 SENSITIVE Sensitive     CEFTRIAXONE <=1 SENSITIVE Sensitive     CIPROFLOXACIN >=4 RESISTANT Resistant     GENTAMICIN <=1 SENSITIVE Sensitive     IMIPENEM <=0.25 SENSITIVE Sensitive     NITROFURANTOIN <=16 SENSITIVE Sensitive     TRIMETH/SULFA <=20 SENSITIVE Sensitive     AMPICILLIN/SULBACTAM 4 SENSITIVE Sensitive     PIP/TAZO <=4 SENSITIVE Sensitive     Extended ESBL NEGATIVE Sensitive     * >=100,000 COLONIES/mL ESCHERICHIA COLI  MRSA PCR Screening     Status: None   Collection Time: 05/22/15  1:19 PM  Result Value Ref Range Status   MRSA by PCR NEGATIVE NEGATIVE Final    Comment:        The GeneXpert MRSA Assay (FDA approved for NASAL specimens only), is one component of a comprehensive MRSA colonization surveillance program. It is not intended to diagnose MRSA infection nor to guide or monitor treatment for MRSA infections.   CULTURE, BLOOD (ROUTINE X 2) w Reflex to PCR ID Panel     Status: None   Collection Time: 05/31/15  9:08 PM  Result Value Ref Range Status   Specimen Description BLOOD RIGHT WRIST  Final  Special Requests BOTTLES DRAWN AEROBIC AND ANAEROBIC  Final   Culture NO GROWTH 5 DAYS  Final   Report Status 06/05/2015 FINAL  Final  CULTURE, BLOOD (ROUTINE X 2) w Reflex to PCR ID Panel     Status: None   Collection Time: 05/31/15  9:10 PM  Result Value Ref Range  Status   Specimen Description BLOOD RIGHT HAND  Final   Special Requests BOTTLES DRAWN AEROBIC AND ANAEROBIC  Final   Culture NO GROWTH 5 DAYS  Final   Report Status 06/05/2015 FINAL  Final  Urine culture     Status: None   Collection Time: 06/01/15 12:59 AM  Result Value Ref Range Status   Specimen Description URINE, RANDOM  Final   Special Requests NONE  Final   Culture NO GROWTH 1 DAY  Final   Report Status 06/02/2015 FINAL  Final    RADIOLOGY:  No results found.    Management plans discussed with the patient, family and they are in agreement.  CODE STATUS:     Code Status Orders        Start     Ordered   05/22/15 1448  Do not attempt resuscitation (DNR)   Continuous    Question Answer Comment  In the event of cardiac or respiratory ARREST Do not call a "code blue"   In the event of cardiac or respiratory ARREST Do not perform Intubation, CPR, defibrillation or ACLS   In the event of cardiac or respiratory ARREST Use medication by any route, position, wound care, and other measures to relive pain and suffering. May use oxygen, suction and manual treatment of airway obstruction as needed for comfort.      05/22/15 1447    Code Status History    Date Active Date Inactive Code Status Order ID Comments User Context   05/22/2015 11:15 AM 05/22/2015  2:47 PM Full Code [id number]  Wyatt Haste, MD ED      TOTAL TIME TAKING CARE OF THIS PATIENT: 40 minutes.    Houston Siren M.D on 06/07/2015 at 11:28 AM  Between 7am to 6pm - Pager - [phone]  After 6pm go to [email or web address] - password [email or web address] [facility]  [facility]  Office  [phone]  CC: Primary care physician; Bobbye Morton, MD

## 2015-06-07 NOTE — Care Management Important Message (Signed)
Important Message  Patient Details  Name: Loraine GripDonna M Odonoghue MRN: [id number] Date of Birth: 03-Aug-1940   Medicare Important Message Given:  No  Patient discharged within 48 hours of previous notice    Eber HongGreene, Angas Isabell R, RN 06/07/2015, 5:01 PM

## 2015-08-19 ENCOUNTER — Emergency Department: Payer: Medicare (Managed Care)

## 2015-08-19 ENCOUNTER — Inpatient Hospital Stay
Admission: EM | Admit: 2015-08-19 | Discharge: 2015-08-26 | DRG: 189 | Disposition: A | Discharge status: Home / Self Care | Payer: Medicare (Managed Care) | Attending: Internal Medicine | Admitting: Internal Medicine

## 2015-08-19 ENCOUNTER — Encounter: Payer: Self-pay | Admitting: Emergency Medicine

## 2015-08-19 DIAGNOSIS — R600 Localized edema: Secondary | ICD-10-CM | POA: Diagnosis not present

## 2015-08-19 DIAGNOSIS — M436 Torticollis: Secondary | ICD-10-CM | POA: Diagnosis present

## 2015-08-19 DIAGNOSIS — F17210 Nicotine dependence, cigarettes, uncomplicated: Secondary | ICD-10-CM | POA: Diagnosis present

## 2015-08-19 DIAGNOSIS — Z8701 Personal history of pneumonia (recurrent): Secondary | ICD-10-CM

## 2015-08-19 DIAGNOSIS — Z22322 Carrier or suspected carrier of Methicillin resistant Staphylococcus aureus: Secondary | ICD-10-CM

## 2015-08-19 DIAGNOSIS — E785 Hyperlipidemia, unspecified: Secondary | ICD-10-CM | POA: Diagnosis present

## 2015-08-19 DIAGNOSIS — R739 Hyperglycemia, unspecified: Secondary | ICD-10-CM | POA: Diagnosis present

## 2015-08-19 DIAGNOSIS — Z79899 Other long term (current) drug therapy: Secondary | ICD-10-CM

## 2015-08-19 DIAGNOSIS — I10 Essential (primary) hypertension: Secondary | ICD-10-CM | POA: Diagnosis present

## 2015-08-19 DIAGNOSIS — F329 Major depressive disorder, single episode, unspecified: Secondary | ICD-10-CM

## 2015-08-19 DIAGNOSIS — J9622 Acute and chronic respiratory failure with hypercapnia: Secondary | ICD-10-CM | POA: Diagnosis present

## 2015-08-19 DIAGNOSIS — J9602 Acute respiratory failure with hypercapnia: Secondary | ICD-10-CM | POA: Diagnosis not present

## 2015-08-19 DIAGNOSIS — F32A Depression, unspecified: Secondary | ICD-10-CM

## 2015-08-19 DIAGNOSIS — K5900 Constipation, unspecified: Secondary | ICD-10-CM

## 2015-08-19 DIAGNOSIS — G9341 Metabolic encephalopathy: Secondary | ICD-10-CM

## 2015-08-19 DIAGNOSIS — Z8614 Personal history of Methicillin resistant Staphylococcus aureus infection: Secondary | ICD-10-CM

## 2015-08-19 DIAGNOSIS — Z9981 Dependence on supplemental oxygen: Secondary | ICD-10-CM

## 2015-08-19 DIAGNOSIS — J9611 Chronic respiratory failure with hypoxia: Secondary | ICD-10-CM | POA: Diagnosis not present

## 2015-08-19 DIAGNOSIS — J441 Chronic obstructive pulmonary disease with (acute) exacerbation: Secondary | ICD-10-CM | POA: Diagnosis present

## 2015-08-19 DIAGNOSIS — J9600 Acute respiratory failure, unspecified whether with hypoxia or hypercapnia: Secondary | ICD-10-CM

## 2015-08-19 DIAGNOSIS — Z886 Allergy status to analgesic agent status: Secondary | ICD-10-CM

## 2015-08-19 DIAGNOSIS — E871 Hypo-osmolality and hyponatremia: Secondary | ICD-10-CM | POA: Diagnosis present

## 2015-08-19 DIAGNOSIS — R631 Polydipsia: Secondary | ICD-10-CM | POA: Diagnosis present

## 2015-08-19 DIAGNOSIS — R0602 Shortness of breath: Secondary | ICD-10-CM | POA: Diagnosis present

## 2015-08-19 DIAGNOSIS — F418 Other specified anxiety disorders: Secondary | ICD-10-CM | POA: Diagnosis present

## 2015-08-19 DIAGNOSIS — E876 Hypokalemia: Secondary | ICD-10-CM | POA: Diagnosis present

## 2015-08-19 DIAGNOSIS — Z720 Tobacco use: Secondary | ICD-10-CM

## 2015-08-19 DIAGNOSIS — J9621 Acute and chronic respiratory failure with hypoxia: Principal | ICD-10-CM | POA: Diagnosis present

## 2015-08-19 DIAGNOSIS — Z716 Tobacco abuse counseling: Secondary | ICD-10-CM

## 2015-08-19 DIAGNOSIS — J209 Acute bronchitis, unspecified: Secondary | ICD-10-CM | POA: Diagnosis present

## 2015-08-19 DIAGNOSIS — Z882 Allergy status to sulfonamides status: Secondary | ICD-10-CM

## 2015-08-19 DIAGNOSIS — Z66 Do not resuscitate: Secondary | ICD-10-CM | POA: Diagnosis present

## 2015-08-19 DIAGNOSIS — J440 Chronic obstructive pulmonary disease with acute lower respiratory infection: Secondary | ICD-10-CM | POA: Diagnosis present

## 2015-08-19 DIAGNOSIS — G629 Polyneuropathy, unspecified: Secondary | ICD-10-CM | POA: Diagnosis present

## 2015-08-19 DIAGNOSIS — G40909 Epilepsy, unspecified, not intractable, without status epilepticus: Secondary | ICD-10-CM | POA: Diagnosis present

## 2015-08-19 DIAGNOSIS — J9612 Chronic respiratory failure with hypercapnia: Secondary | ICD-10-CM

## 2015-08-19 DIAGNOSIS — R531 Weakness: Secondary | ICD-10-CM

## 2015-08-19 DIAGNOSIS — E872 Acidosis: Secondary | ICD-10-CM

## 2015-08-19 DIAGNOSIS — Z7951 Long term (current) use of inhaled steroids: Secondary | ICD-10-CM

## 2015-08-19 DIAGNOSIS — F419 Anxiety disorder, unspecified: Secondary | ICD-10-CM

## 2015-08-19 DIAGNOSIS — Z880 Allergy status to penicillin: Secondary | ICD-10-CM

## 2015-08-19 LAB — CBC WITH DIFFERENTIAL/PLATELET
BASOS ABS: 0 10*3/uL (ref 0–0.1)
BASOS PCT: 0 %
EOS PCT: 0 %
Eosinophils Absolute: 0 10*3/uL (ref 0–0.7)
HCT: 41.2 % (ref 35.0–47.0)
Hemoglobin: 13.8 g/dL (ref 12.0–16.0)
Lymphocytes Relative: 3 %
Lymphs Abs: 0.4 10*3/uL — ABNORMAL LOW (ref 1.0–3.6)
MCH: 31.2 pg (ref 26.0–34.0)
MCHC: 33.6 g/dL (ref 32.0–36.0)
MCV: 93.1 fL (ref 80.0–100.0)
MONO ABS: 1.5 10*3/uL — AB (ref 0.2–0.9)
Monocytes Relative: 10 %
Neutro Abs: 12.9 10*3/uL — ABNORMAL HIGH (ref 1.4–6.5)
Neutrophils Relative %: 87 %
Platelets: 236 10*3/uL (ref 150–440)
RBC: 4.42 MIL/uL (ref 3.80–5.20)
RDW: 18.8 % — AB (ref 11.5–14.5)
WBC: 14.8 10*3/uL — ABNORMAL HIGH (ref 3.6–11.0)

## 2015-08-19 LAB — BASIC METABOLIC PANEL
Anion gap: 8 (ref 5–15)
BUN: 9 mg/dL (ref 6–20)
CALCIUM: 8.7 mg/dL — AB (ref 8.9–10.3)
CO2: 47 mmol/L — ABNORMAL HIGH (ref 22–32)
CREATININE: 0.55 mg/dL (ref 0.44–1.00)
Chloride: 70 mmol/L — ABNORMAL LOW (ref 101–111)
GFR calc Af Amer: >60 mL/min (ref >60)
Glucose, Bld: 221 mg/dL — ABNORMAL HIGH (ref 65–99)
Potassium: 2.7 mmol/L — CL (ref 3.5–5.1)
SODIUM: 125 mmol/L — AB (ref 135–145)

## 2015-08-19 LAB — PROCALCITONIN

## 2015-08-19 LAB — GLUCOSE, CAPILLARY: Glucose-Capillary: 139 mg/dL — ABNORMAL HIGH (ref 65–99)

## 2015-08-19 LAB — POTASSIUM: Potassium: 2.9 mmol/L — ABNORMAL LOW (ref 3.5–5.1)

## 2015-08-19 LAB — LACTIC ACID, PLASMA
LACTIC ACID, VENOUS: 1.2 mmol/L (ref 0.5–1.9)
LACTIC ACID, VENOUS: 1.1 mmol/L (ref 0.5–1.9)

## 2015-08-19 LAB — MAGNESIUM
Magnesium: 1.6 mg/dL — ABNORMAL LOW (ref 1.7–2.4)
Magnesium: 2 mg/dL (ref 1.7–2.4)

## 2015-08-19 LAB — TROPONIN I: TROPONIN I: 0.04 ng/mL — AB (ref <0.03)

## 2015-08-19 LAB — CORTISOL: Cortisol, Plasma: 23.5 ug/dL

## 2015-08-19 LAB — MRSA PCR SCREENING: MRSA by PCR: POSITIVE — AB

## 2015-08-19 MED ORDER — SODIUM CHLORIDE 0.9 % IV SOLN: 250.0000 mL | INTRAVENOUS | Status: DC | PRN

## 2015-08-19 MED ORDER — SODIUM CHLORIDE 0.9 % IV BOLUS (SEPSIS)
1000.0000 mL | Freq: Once | INTRAVENOUS | Status: AC
  Administered 2015-08-19: 1000 mL via INTRAVENOUS

## 2015-08-19 MED ORDER — BUDESONIDE 0.25 MG/2ML IN SUSP
0.2500 mg | Freq: Three times a day (TID) | RESPIRATORY_TRACT | Status: DC
  Administered 2015-08-19 – 2015-08-20 (×3): 0.25 mg via RESPIRATORY_TRACT
  Filled 2015-08-19 (×3): qty 2

## 2015-08-19 MED ORDER — PHENYTOIN SODIUM EXTENDED 100 MG PO CAPS
300.0000 mg | ORAL_CAPSULE | Freq: Every day | ORAL | Status: DC
  Administered 2015-08-19 – 2015-08-25 (×7): 300 mg via ORAL
  Filled 2015-08-19 (×7): qty 3

## 2015-08-19 MED ORDER — IPRATROPIUM-ALBUTEROL 0.5-2.5 (3) MG/3ML IN SOLN
9.0000 mL | Freq: Once | RESPIRATORY_TRACT | Status: AC
  Administered 2015-08-19: 9 mL via RESPIRATORY_TRACT
  Filled 2015-08-19: qty 9

## 2015-08-19 MED ORDER — METHYLPREDNISOLONE SODIUM SUCC 125 MG IJ SOLR
125.0000 mg | Freq: Once | INTRAMUSCULAR | Status: AC
  Administered 2015-08-19: 125 mg via INTRAVENOUS
  Filled 2015-08-19: qty 2

## 2015-08-19 MED ORDER — IPRATROPIUM BROMIDE 0.02 % IN SOLN: 0.5000 mg | RESPIRATORY_TRACT | Status: DC

## 2015-08-19 MED ORDER — POTASSIUM CHLORIDE 2 MEQ/ML IV SOLN
INTRAVENOUS | Status: DC
  Administered 2015-08-20: 04:00:00 via INTRAVENOUS
  Filled 2015-08-19 (×4): qty 1000

## 2015-08-19 MED ORDER — KCL-LACTATED RINGERS 20 MEQ/L IV SOLN: INTRAVENOUS | Status: DC

## 2015-08-19 MED ORDER — ENOXAPARIN SODIUM 40 MG/0.4ML ~~LOC~~ SOLN: 40.0000 mg | SUBCUTANEOUS | Status: DC

## 2015-08-19 MED ORDER — MAGNESIUM SULFATE 2 GM/50ML IV SOLN
2.0000 g | Freq: Once | INTRAVENOUS | Status: AC
  Administered 2015-08-19: 2 g via INTRAVENOUS
  Filled 2015-08-19: qty 50

## 2015-08-19 MED ORDER — METHYLPREDNISOLONE SODIUM SUCC 125 MG IJ SOLR
60.0000 mg | Freq: Two times a day (BID) | INTRAMUSCULAR | Status: DC
  Administered 2015-08-19 – 2015-08-20 (×2): 60 mg via INTRAVENOUS
  Filled 2015-08-19 (×2): qty 2

## 2015-08-19 MED ORDER — BUDESONIDE 180 MCG/ACT IN AEPB: 2.0000 | INHALATION_SPRAY | Freq: Three times a day (TID) | RESPIRATORY_TRACT | Status: DC

## 2015-08-19 MED ORDER — ENOXAPARIN SODIUM 40 MG/0.4ML ~~LOC~~ SOLN
40.0000 mg | SUBCUTANEOUS | Status: DC
  Administered 2015-08-19 – 2015-08-25 (×7): 40 mg via SUBCUTANEOUS
  Filled 2015-08-19 (×7): qty 0.4

## 2015-08-19 MED ORDER — LEVOFLOXACIN IN D5W 750 MG/150ML IV SOLN
750.0000 mg | Freq: Once | INTRAVENOUS | Status: AC
  Administered 2015-08-19: 750 mg via INTRAVENOUS
  Filled 2015-08-19: qty 150

## 2015-08-19 MED ORDER — LEVOFLOXACIN IN D5W 750 MG/150ML IV SOLN
750.0000 mg | INTRAVENOUS | Status: DC
  Filled 2015-08-19: qty 150

## 2015-08-19 MED ORDER — KCL-LACTATED RINGERS 20 MEQ/L IV SOLN
INTRAVENOUS | Status: DC
  Administered 2015-08-19: 15:00:00 via INTRAVENOUS
  Filled 2015-08-19 (×2): qty 1000

## 2015-08-19 MED ORDER — HYDRALAZINE HCL 20 MG/ML IJ SOLN
10.0000 mg | INTRAMUSCULAR | Status: DC | PRN
  Administered 2015-08-19: 10 mg via INTRAVENOUS
  Filled 2015-08-19: qty 1

## 2015-08-19 NOTE — ED Notes (Signed)
Pts bed changed, new brief applied.

## 2015-08-19 NOTE — H&P (Signed)
PULMONARY / CRITICAL CARE MEDICINE   Name: Candace GripDonna M Blankenbeckler MRN: [id number] DOB: December 03, 1940    ADMISSION DATE:  08/19/2015 CONSULTATION DATE:  08/19/2015  REFERRING MD:  Dr. Cherlynn KaiserSainani  CHIEF COMPLAINT:  Acute respiratory failure  HISTORY OF PRESENT ILLNESS:   This is a 75 yo female with PMH of COPD, HTN, seizures, peripheral neuropathy, right torticollis, anxiety, depression, psychogenic polydipsia, MRSA pneumonia (2014), hyperlipidemia, and asthma.  She presented to [facility] ER on 7/7 with increased lethargy and somnolence since this am and a productive cough onset 2 days ago. According to [facility] RN who follows patient daily she went to pick the patient up from her clinic visit today and noticed the pt was severely dyspneic, O2 sats were 50% pt was given a Duoneb treatment and placed back on 2L O2 via nasal canula.  Despite Duoneb treatment pt remained hypoxic with O2 sats at 65% pt was brought to ER and started on continuous Bipap.  PCCM admitting to stepdown unit for management of acute hypoxic hypercapnic respiratory failure secondary to COPD exacerbation and ?CAP.  Her primary pulmonologist is Dr. Meredeth IdeFleming.    PAST MEDICAL HISTORY :  She  has a past medical history of COPD (chronic obstructive pulmonary disease) (HCC); Hypertension; Seizures (HCC); Peripheral neuropathy (HCC); Torticollis (Right); Anxiety; Depression; Psychogenic polydipsia; MRSA pneumonia (HCC); Hyperlipemia; and Asthma.  PAST SURGICAL HISTORY: She  has past surgical history that includes Abdominal hysterectomy.  Allergies  Allergen Reactions  . Aspirin   . Codeine   . Contrast Media [Iodinated Diagnostic Agents] Hives    Hives with contrast media injections  . Penicillins   . Sulfa Antibiotics     No current facility-administered medications on file prior to encounter.   Current Outpatient Prescriptions on File Prior to Encounter  Medication Sig  . acetaminophen (TYLENOL) 650 MG CR tablet Take 650 mg by mouth every 12  (twelve) hours.  Marland Kitchen. albuterol (PROVENTIL HFA;VENTOLIN HFA) 108 (90 Base) MCG/ACT inhaler Inhale 2 puffs into the lungs every 6 (six) hours as needed for wheezing or shortness of breath.  Marland Kitchen. albuterol (PROVENTIL) (2.5 MG/3ML) 0.083% nebulizer solution Take 2.5 mg by nebulization every 4 (four) hours as needed for wheezing or shortness of breath.  . ALPRAZolam (XANAX) 0.5 MG tablet Take 1 tablet (0.5 mg total) by mouth 2 (two) times daily as needed for anxiety.  . baclofen (LIORESAL) 10 MG tablet Take 10 mg by mouth 2 (two) times daily. PT ALSO TAKES 1 TABLET AT BEDTIME PRN  . fexofenadine (ALLEGRA) 180 MG tablet Take 180 mg by mouth daily.  . Fluticasone-Salmeterol (ADVAIR) 250-50 MCG/DOSE AEPB Inhale 1 puff into the lungs 2 (two) times daily.  . furosemide (LASIX) 20 MG tablet Take 1 tablet (20 mg total) by mouth daily.  Marland Kitchen. gabapentin (NEURONTIN) 100 MG capsule Take 100 mg by mouth daily.  Marland Kitchen. guaiFENesin-dextromethorphan (ROBITUSSIN DM) 100-10 MG/5ML syrup Take 10 mLs by mouth every 6 (six) hours as needed for cough.  Marland Kitchen. ipratropium-albuterol (DUONEB) 0.5-2.5 (3) MG/3ML SOLN Take 3 mLs by nebulization every 4 (four) hours as needed (wheeze).  . metoprolol tartrate (LOPRESSOR) 25 MG tablet Take 1 tablet (25 mg total) by mouth 2 (two) times daily.  . mirtazapine (REMERON SOL-TAB) 15 MG disintegrating tablet Take 1 tablet (15 mg total) by mouth at bedtime.  Marland Kitchen. OLANZapine zydis (ZYPREXA) 5 MG disintegrating tablet Take 5 mg by mouth at bedtime.  . phenytoin (DILANTIN) 300 MG ER capsule Take 300 mg by mouth at bedtime.  .Marland Kitchen  ranitidine (ZANTAC) 150 MG tablet Take 150 mg by mouth 2 (two) times daily.  . sennosides-docusate sodium (SENOKOT-S) 8.6-50 MG tablet Take 1 tablet by mouth daily.  . sertraline (ZOLOFT) 100 MG tablet Take 100 mg by mouth daily.  . simvastatin (ZOCOR) 20 MG tablet Take 20 mg by mouth daily at 6 PM.  . sodium chloride (OCEAN) 0.65 % nasal spray Place 1 spray into the nose as needed. For  congestion  . sodium chloride 1 g tablet Take 1 tablet (1 g total) by mouth 2 (two) times daily with a meal.  . tamsulosin (FLOMAX) 0.4 MG CAPS capsule Take 0.4 mg by mouth daily.  Marland Kitchen. tiotropium (SPIRIVA) 18 MCG inhalation capsule Place 18 mcg into inhaler and inhale daily.  Marland Kitchen. zolpidem (AMBIEN) 5 MG tablet Take 5 mg by mouth at bedtime as needed for sleep.    FAMILY HISTORY:  Her has no family status information on file.   SOCIAL HISTORY: She  reports that she has been smoking Cigarettes.  She has a 110 pack-year smoking history. She does not have any smokeless tobacco history on file. She reports that she does not drink alcohol or use illicit drugs.  REVIEW OF SYSTEMS:   Unable to assess pt currently somnolent on bipap  SUBJECTIVE:  Ill appearing female currently lethargic able to follow commands with bipap mask in place.  VITAL SIGNS: BP 153/88 mmHg  Pulse 81  Temp(Src) 97.9 F (36.6 C) (Oral)  Resp 18  Ht 5\' 5"  (1.651 m)  Wt 158 lb (71.668 kg)  BMI 26.29 kg/m2  SpO2 100%  HEMODYNAMICS:    VENTILATOR SETTINGS:    INTAKE / OUTPUT:    PHYSICAL EXAMINATION: General:  Chronically ill appearing female currently on bipap Neuro: somnolent however follows commands  HEENT:  Supple, no JVD Cardiovascular:  S1s2, rrrr, no M/R/G Lungs:  Tachypnea requiring continuous bipap, diffuse expiratory wheezing, no crackles  Abdomen: +BS x4, soft, non tender, non distended Musculoskeletal:  Normal bulk and tone, trace bilateral lower extremity edema Skin:  Intact, no rash  LABS:  BMET  Recent Labs Lab 08/19/15 1129  NA 125*  K 2.7*  CL 70*  CO2 47*  BUN 9  CREATININE 0.55  GLUCOSE 221*    Electrolytes  Recent Labs Lab 08/19/15 1129  CALCIUM 8.7*  MG 1.6*    CBC  Recent Labs Lab 08/19/15 1129  WBC 14.8*  HGB 13.8  HCT 41.2  PLT 236    Coag's No results for input(s): APTT, INR in the last 168 hours.  Sepsis Markers  Recent Labs Lab 08/19/15 1129   LATICACIDVEN 1.2    ABG No results for input(s): PHART, PCO2ART, PO2ART in the last 168 hours.  Liver Enzymes No results for input(s): AST, ALT, ALKPHOS, BILITOT, ALBUMIN in the last 168 hours.  Cardiac Enzymes  Recent Labs Lab 08/19/15 1129  TROPONINI 0.04*    Glucose No results for input(s): GLUCAP in the last 168 hours.  Imaging Dg Chest Portable 1 View  08/19/2015  CLINICAL DATA:  Shortness of breath and cough for 1 week. History of COPD, hypertension, seizure, asthma. Current everyday smoker. EXAM: PORTABLE CHEST 1 VIEW COMPARISON:  Chest x-rays dated 05/31/2015 and 09/11/2012. FINDINGS: Cardiomegaly is stable. Atherosclerotic changes again noted at the aortic arch. Lungs are hyperexpanded compatible with given history of COPD. Suspect associated chronic interstitial lung disease and chronic bronchitic changes centrally. No new lung findings. No evidence of pneumonia. No pleural effusion or pneumothorax seen. Osseous and  soft tissue structures about the chest are unremarkable. IMPRESSION: 1. No acute findings.  No evidence of pneumonia. 2. Stable cardiomegaly. 3. Aortic atherosclerosis. 4. Hyperexpanded lungs indicating COPD. Suspect associated chronic interstitial lung disease and chronic bronchitic changes. Electronically Signed   By: Bary Richard M.D.   On: 08/19/2015 11:56     STUDIES:  None  CULTURES: Blood x2 7/7>> Urine 7/7>> Sputum 7/7>>  ANTIBIOTICS: Levaquin 7/7>>  SIGNIFICANT EVENTS: -This is a 75 yo female admitted on 7/7 due to acute hypoxic hypercapnic respiratory failure secondary to COPD exacerbation and ?CAP. -PCCM admitted pt to stepdown 7/7  LINES/TUBES: PIV  DISCUSSION: This is a 75 yo female admitted on 7/7 due to acute hypoxic hypercapnic respiratory failure secondary to COPD exacerbation and ?CAP.  PCCM admitted pt to stepdown 7/7  ASSESSMENT / PLAN:  PULMONARY A: Acute hypoxic hypercapnic respiratory failure secondary to COPD  exacerbation and ?CAP Hx: Asthma, MRSA Pneumonia (2014) P:   Continuous Bipap Target Vt=400-450 Maintain O2 sats 88% to 94% Pulmicort, duonebs, and solumedrol Repeat ABG today 7/7 at 17:00 Intermittent CXR  CARDIOVASCULAR A:  Hx: HTN, Hyperlipidemia P:  Hold outpatient po zocor and metoprolol pt unable to take po's due to bipap requirements  Telemetry monitoring Trend troponin's  RENAL A:   Hyponatremia Hypokalemia Hypomagnesium P:   Trend BMP;s Replace electrolytes Recheck K+ at 17:00 today Monitor UOP LR 20 meq K+ at 75 ml/hr  GASTROINTESTINAL A:   No acute issues P:   Keep NPO for now   HEMATOLOGIC A:   No acute issues P:  Lovenox for VTE prophylaxis Monitor for s/sx of bleeding Transfuse for HgB <7  INFECTIOUS A: ?CAP Leukocytosis-no current infiltrate on CXR 7/7 P:   Trend WBC's and monitor fever curve  Continue ABX for now  Trend PCT's and lactic acid  Follow blood, sputum, and urine cultures  ENDOCRINE A:   Hyperglycemia  P:   CBG's q4hrs  NEUROLOGIC A:   Somnolence  P:   RASS goal: N/A Avoid sedating medications Keep lights on during the day Reorient pt frequently    FAMILY  - Updates: Per [facility] RN pt is DNR, updated pts daughter about plan of care and questions answered.  - Inter-disciplinary family meet or Palliative Care meeting due by:  08/26/15    Sonda Rumble, AGNP  Pulmonary/Critical Care Pager [phone] (please enter 7 digits) PCCM Consult Pager [phone] (please enter 7 digits)   STAFF NOTE: I, Dr. Stephanie Acre have personally reviewed patient's available data, including medical history, events of note, physical examination and test results as part of my evaluation. I have discussed with resident/NP NP Karin Golden and other care providers such as pharmacist, RN and RRT.  In addition,  I personally evaluated patient and elicited key findings of   HPI:  every 8-year-old female past medical history of  COPD on 2 L, hypertension, seizures, peripheral neuropathy, continued tobacco abuse, admitted for acute hypercapnic respiratory failure. Patient is on BiPAP difficult to obtain history she is also somnolent. History obtained from [facility] RN, stated that they follow patient daily basis, this morning patient was noted to be somnolent and lethargic, her O2 sats initially were 50%, she was placed back on her oxygen 2 L given and nebulizer treatment only improve her saturation to 60%, which point EMS was called and patient was brought to the ER. By the time of the arrival she was noted to be more weak so very somewhat, placed on BiPAP. Daughter at  bedside states patient continues to smoke even while on oxygen  O:  GEN-lethargic, ill appearing , somnolent but easily awaken HEENT-PEERLA CVS-s1, s2, no murmurs LUNGS-diffuse wheezing, shallow breath sounds ABD-soft, nt, nd, +BS MSK-no edema   Recent Labs CBC Latest Ref Rng 08/19/2015 06/05/2015 06/01/2015  WBC 3.6 - 11.0 K/uL 14.8(H) 3.7 5.7  Hemoglobin 12.0 - 16.0 g/dL 19.113.8 47.812.5 29.513.0  Hematocrit 35.0 - 47.0 % 41.2 37.6 39.1  Platelets 150 - 440 K/uL 236 135(L) 150      Recent Labs BMP Latest Ref Rng 08/19/2015 06/06/2015 06/05/2015  Glucose 65 - 99 mg/dL 621(H221(H) 086(V110(H) 784(O103(H)  BUN 6 - 20 mg/dL 9 13 15   Creatinine 0.44 - 1.00 mg/dL 9.620.55 9.520.53 8.410.60  Sodium 135 - 145 mmol/L 125(L) 129(L) 129(L)  Potassium 3.5 - 5.1 mmol/L 2.7(LL) 4.8 5.1  Chloride 101 - 111 mmol/L 70(L) 91(L) 93(L)  CO2 22 - 32 mmol/L 47(H) 30 27  Calcium 8.9 - 10.3 mg/dL 3.2(G8.7(L) 4.0(N8.6(L) 8.3(L)       (The following images and results were reviewed by Dr. Dema SeverinMungal on 08/19/2015). Dg Chest Portable 1 View  08/19/2015  CLINICAL DATA:  Shortness of breath and cough for 1 week. History of COPD, hypertension, seizure, asthma. Current everyday smoker. EXAM: PORTABLE CHEST 1 VIEW COMPARISON:  Chest x-rays dated 05/31/2015 and 09/11/2012. FINDINGS: Cardiomegaly is stable. Atherosclerotic changes again  noted at the aortic arch. Lungs are hyperexpanded compatible with given history of COPD. Suspect associated chronic interstitial lung disease and chronic bronchitic changes centrally. No new lung findings. No evidence of pneumonia. No pleural effusion or pneumothorax seen. Osseous and soft tissue structures about the chest are unremarkable. IMPRESSION: 1. No acute findings.  No evidence of pneumonia. 2. Stable cardiomegaly. 3. Aortic atherosclerosis. 4. Hyperexpanded lungs indicating COPD. Suspect associated chronic interstitial lung disease and chronic bronchitic changes. Electronically Signed   By: Bary RichardStan  Maynard M.D.   On: 08/19/2015 11:6456      A: 75 year old female past medical history of COPD on 2 L oxygen, hypertension, seizures, peripheral neuropathy, tobacco abuse, history of MRSA pneumonia, admitted for acute and chronic hypercapnic and hypoxic respiratory failure.  Acute on chronic hypoxic/hypercapnic respiratory failure COPD exacerbation COPD Hypoxia Tobacco abuse Chronic respiratory failure on 2 L Peripheral neuropathy Anxiety Depression Hyperlipidemia  P:   -Suspect that her respiratory failure is due to continued tobacco abuse and possibly rapid weather changes over the past 3-4 days. -Continue with steroids, DuoNeb's, bronchodilators, antibiotics -May take breaks of the BiPAP once more alert -patient has MOS Form and DNR forms, DO NOT RESUSCITATE orders entered -Daughter updated at bedside  .  Rest per NP/medical resident whose note is outlined above and that I agree with  The patient is critically ill with multiple organ systems failure and requires high complexity decision making for assessment and support, frequent evaluation and titration of therapies, application of advanced monitoring technologies and extensive interpretation of multiple databases.   Critical Care Time devoted to patient care services described in this note is  45 Minutes.   This time reflects time  of care of this signee Dr Stephanie AcreVishal Kamla Skilton.  This critical care time does not reflect procedure time, or teaching time or supervisory time of PA/NP/Med-student/Med Resident etc but could involve care discussion time.  Stephanie AcreVishal Jemila Camille, MD [facility] Pager [phone]- [phone] (please enter 7-digits) On Call Pager - [phone] (please enter 7-digits)  Note: This note was prepared with Dragon dictation along with  smaller phrase technology. Any transcriptional errors that result from this process are unintentional.

## 2015-08-19 NOTE — Progress Notes (Signed)
ABG was unsuccessful in the first attempt. Pt.'s daughter stated take the needle out and don't try again, they took enough blood from her in the ER. I explained to her this blood was coming from an artery.

## 2015-08-19 NOTE — Progress Notes (Signed)
PHARMACY - CRITICAL CARE PROGRESS NOTE  Pharmacy Consult for antibiotic renal adjustment   Allergies  Allergen Reactions  . Aspirin   . Codeine   . Contrast Media [Iodinated Diagnostic Agents] Hives    Hives with contrast media injections  . Penicillins   . Sulfa Antibiotics    Patient Measurements: Height: 5\' 5"  (165.1 cm) Weight: 158 lb (71.668 kg) IBW/kg (Calculated) : 57  Vital Signs: Temp: 97.9 F (36.6 C) (07/07 1135) Temp Source: Oral (07/07 1135) BP: 153/88 mmHg (07/07 1200) Pulse Rate: 81 (07/07 1209) Intake/Output from previous day:   Intake/Output from this shift:   Vent settings for last 24 hours:    Labs:  Recent Labs  08/19/15 1129  WBC 14.8*  HGB 13.8  HCT 41.2  PLT 236  CREATININE 0.55  MG 1.6*   Estimated Creatinine Clearance: 60.3 mL/min (by C-G formula based on Cr of 0.55).  No results for input(s): GLUCAP in the last 72 hours.  Microbiology: No results found for this or any previous visit (from the past 720 hour(s)).  Assessment: Pharmacy consulted to monitor renal function and adjust antibiotics if necessary in this 75 year old female admitted with respiratory distress and pneumonia.  Plan:  Continue levofloxacin 750 mg IV daily  Cindi CarbonMary M Cynia Abruzzo, PharmD, BCPS 08/19/2015,1:58 PM

## 2015-08-19 NOTE — Progress Notes (Signed)
Update  Ac. resp failure - hypoxic and hypercapnic AECOPD COPD on 2L cont Primary pulmonologist - Dr.Fleming  Spoke with [facility] RN - [facility]an[facility], [phone] (office), [phone] (cell) Full H&P to follow Brief HPI - Followed daily by [facility] RN, somnolent, lethargic this AM, O2 sat ~50, given breathing treatment placed back on 2L o2, sat recheck 65%, brought to the ER, and started on bipap.  HCPOA - younger daughter CODE Status - DNR (has orders and paper work to support this)  Plan - admit to step down - continuous bipap, when more awake can take breaks. - steroids, nebs, antibiotics.  Family updated at bedside - Older daughter, attempted to call HCPOA, but got VM.   Stephanie AcreVishal Jermiah Howton, MD [facility] Pager [phone]- [phone] (please enter 7-digits) On Call Pager - [phone] (please enter 7-digits)

## 2015-08-19 NOTE — ED Provider Notes (Signed)
[facility] Emergency Department Provider Note  ____________________________________________  Time seen: 11:20 AM  I have reviewed the triage vital signs and the nursing notes.   HISTORY  Chief Complaint Shortness of Breath Shortness of breath   HPI Candace Woods is a 75 y.o. female complains of worsening shortness of breath for 2 days associated with productive cough. No fevers chills chest pain abdominal pain vomiting or diarrhea. No dizziness or syncope. Her pace nurse went to pick her up for her clinic visit today, and found that the patient was severely dyspneic with a oxygen saturation of 49% on her usual 2 L nasal cannula.  She gave the patient a DuoNeb, contacted the clinic who advised that the patient should be brought to the emergency department for evaluation. Patient reports her shortness of breath has been severely worsened recently whenever she tries to do any kind of walking or activity.  Has a history of COPD, on 2 L nasal cannula at all times at home. Has a DO NOT RESUSCITATE directive per the pace nurse who knows her well..     Past Medical History  Diagnosis Date  . COPD (chronic obstructive pulmonary disease) (HCC)   . Hypertension   . Seizures (HCC)   . Peripheral neuropathy (HCC)   . Torticollis Right  . Anxiety   . Depression   . Psychogenic polydipsia   . MRSA pneumonia (HCC)     2014  . Hyperlipemia   . Asthma      Patient Active Problem List   Diagnosis Date Noted  . Acute delirium 05/26/2015  . 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 Seizures (HCC) 05/26/2015  . Psychosis 05/26/2015  . Acute on chronic respiratory failure with hypoxia (HCC) 05/22/2015     Past Surgical History  Procedure Laterality Date  . Abdominal hysterectomy       Current Outpatient Rx  Name  Route  Sig  Dispense  Refill  . acetaminophen (TYLENOL) 650 MG CR tablet   Oral   Take 650 mg by mouth every 12 (twelve) hours.         Marland Kitchen  albuterol (PROVENTIL HFA;VENTOLIN HFA) 108 (90 Base) MCG/ACT inhaler   Inhalation   Inhale 2 puffs into the lungs every 6 (six) hours as needed for wheezing or shortness of breath.         Marland Kitchen albuterol (PROVENTIL) (2.5 MG/3ML) 0.083% nebulizer solution   Nebulization   Take 2.5 mg by nebulization every 4 (four) hours as needed for wheezing or shortness of breath.         . ALPRAZolam (XANAX) 0.5 MG tablet   Oral   Take 1 tablet (0.5 mg total) by mouth 2 (two) times daily as needed for anxiety.   30 tablet   0   . baclofen (LIORESAL) 10 MG tablet   Oral   Take 10 mg by mouth 2 (two) times daily. PT ALSO TAKES 1 TABLET AT BEDTIME PRN         . fexofenadine (ALLEGRA) 180 MG tablet   Oral   Take 180 mg by mouth daily.         . Fluticasone-Salmeterol (ADVAIR) 250-50 MCG/DOSE AEPB   Inhalation   Inhale 1 puff into the lungs 2 (two) times daily.         . furosemide (LASIX) 20 MG tablet   Oral   Take 1 tablet (20 mg total) by mouth daily.   30 tablet      . gabapentin (  NEURONTIN) 100 MG capsule   Oral   Take 100 mg by mouth daily.         Marland Kitchen guaiFENesin-dextromethorphan (ROBITUSSIN DM) 100-10 MG/5ML syrup   Oral   Take 10 mLs by mouth every 6 (six) hours as needed for cough.         Marland Kitchen ipratropium-albuterol (DUONEB) 0.5-2.5 (3) MG/3ML SOLN   Nebulization   Take 3 mLs by nebulization every 4 (four) hours as needed (wheeze).         . metoprolol tartrate (LOPRESSOR) 25 MG tablet   Oral   Take 1 tablet (25 mg total) by mouth 2 (two) times daily.         . mirtazapine (REMERON SOL-TAB) 15 MG disintegrating tablet   Oral   Take 1 tablet (15 mg total) by mouth at bedtime.         Marland Kitchen OLANZapine zydis (ZYPREXA) 5 MG disintegrating tablet   Oral   Take 5 mg by mouth at bedtime.         . phenytoin (DILANTIN) 300 MG ER capsule   Oral   Take 300 mg by mouth at bedtime.         . ranitidine (ZANTAC) 150 MG tablet   Oral   Take 150 mg by mouth 2 (two)  times daily.         . sennosides-docusate sodium (SENOKOT-S) 8.6-50 MG tablet   Oral   Take 1 tablet by mouth daily.         . sertraline (ZOLOFT) 100 MG tablet   Oral   Take 100 mg by mouth daily.         . simvastatin (ZOCOR) 20 MG tablet   Oral   Take 20 mg by mouth daily at 6 PM.         . sodium chloride (OCEAN) 0.65 % nasal spray   Nasal   Place 1 spray into the nose as needed. For congestion         . sodium chloride 1 g tablet   Oral   Take 1 tablet (1 g total) by mouth 2 (two) times daily with a meal.         . tamsulosin (FLOMAX) 0.4 MG CAPS capsule   Oral   Take 0.4 mg by mouth daily.         Marland Kitchen tiotropium (SPIRIVA) 18 MCG inhalation capsule   Inhalation   Place 18 mcg into inhaler and inhale daily.         Marland Kitchen zolpidem (AMBIEN) 5 MG tablet   Oral   Take 5 mg by mouth at bedtime as needed for sleep.            Allergies Aspirin; Codeine; Contrast media; Penicillins; and Sulfa antibiotics   Family History  Problem Relation Age of Onset  . Hypertension Other     Social History Social History  Substance Use Topics  . Smoking status: Current Every Day Smoker -- 2.00 packs/day for 55 years    Types: Cigarettes  . Smokeless tobacco: None  . Alcohol Use: No    Review of Systems  Constitutional:   No fever or chills.  ENT:   No sore throat. No rhinorrhea. Cardiovascular:   No chest pain. Respiratory:   Positive shortness of breath and productive cough. Gastrointestinal:   Negative for abdominal pain, vomiting and diarrhea.  Genitourinary:   Negative for dysuria or difficulty urinating. Musculoskeletal:   Negative for focal pain or swelling Neurological:  Negative for headaches 10-point ROS otherwise negative.  ____________________________________________   PHYSICAL EXAM:  VITAL SIGNS: ED Triage Vitals  Enc Vitals Group     BP --      Pulse --      Resp --      Temp --      Temp src --      SpO2 --      Weight --       Height --      Head Cir --      Peak Flow --      Pain Score --      Pain Loc --      Pain Edu? --      Excl. in GC? --     Vital signs reviewed, nursing assessments reviewed.   Constitutional:   Alert and oriented. Ill-appearing Eyes:   No scleral icterus. No conjunctival pallor. PERRL. EOMI.  No nystagmus. ENT   Head:   Normocephalic and atraumatic.   Nose:   No congestion/rhinnorhea. No septal hematoma   Mouth/Throat:   Dry mucous membranes, no pharyngeal erythema. No peritonsillar mass.    Neck:   No stridor. No SubQ emphysema. No meningismus. Hematological/Lymphatic/Immunilogical:   No cervical lymphadenopathy. Cardiovascular:   RRR. Symmetric bilateral radial and DP pulses.  No murmurs.  Respiratory:  Tachypnea. Good air movement through all lung fields, but prolonged expiratory phase and diffuse expiratory wheezing. No focal crackles or absent breath sounds. Gastrointestinal:   Soft and nontender. Non distended. There is no CVA tenderness.  No rebound, rigidity, or guarding. Genitourinary:   deferred Musculoskeletal:   Nontender with normal range of motion in all extremities. No joint effusions.  No lower extremity tenderness.  No edema. Neurologic:   Normal speech and language.  CN 2-10 normal. Motor grossly intact. No gross focal neurologic deficits are appreciated.  Skin:    Skin is warm, dry and intact. No rash noted.  No petechiae, purpura, or bullae.  ____________________________________________    LABS (pertinent positives/negatives) (all labs ordered are listed, but only abnormal results are displayed) Labs Reviewed  BLOOD GAS, VENOUS - Abnormal; Notable for the following:    pH, Ven 7.31 (*)    pCO2, Ven 106 (*)    pO2, Ven <31.0 (*)    Bicarbonate 53.4 (*)    Acid-Base Excess 21.0 (*)    All other components within normal limits  BASIC METABOLIC PANEL - Abnormal; Notable for the following:    Sodium 125 (*)    Potassium 2.7 (*)    Chloride  70 (*)    CO2 47 (*)    Glucose, Bld 221 (*)    Calcium 8.7 (*)    All other components within normal limits  TROPONIN I - Abnormal; Notable for the following:    Troponin I 0.04 (*)    All other components within normal limits  CBC WITH DIFFERENTIAL/PLATELET - Abnormal; Notable for the following:    WBC 14.8 (*)    RDW 18.8 (*)    Neutro Abs 12.9 (*)    Lymphs Abs 0.4 (*)    Monocytes Absolute 1.5 (*)    All other components within normal limits  MAGNESIUM - Abnormal; Notable for the following:    Magnesium 1.6 (*)    All other components within normal limits  CULTURE, BLOOD (ROUTINE X 2)  CULTURE, BLOOD (ROUTINE X 2)  LACTIC ACID, PLASMA  LACTIC ACID, PLASMA   ____________________________________________   EKG Interpreted by me  Sinus rhythm rate of 95, left axis, normal intervals. Right bundle-branch block. Normal ST segments and T waves.   ____________________________________________    RADIOLOGY  cxr unremarkable.   ____________________________________________   PROCEDURES CRITICAL CARE Performed by: Scotty CourtSTAFFORD, Phyillis Dascoli   Total critical care time: 35 minutes  Critical care time was exclusive of separately billable procedures and treating other patients.  Critical care was necessary to treat or prevent imminent or life-threatening deterioration.  Critical care was time spent personally by me on the following activities: development of treatment plan with patient and/or surrogate as well as nursing, discussions with consultants, evaluation of patient's response to treatment, examination of patient, obtaining history from patient or surrogate, ordering and performing treatments and interventions, ordering and review of laboratory studies, ordering and review of radiographic studies, pulse oximetry and re-evaluation of patient's condition.   ____________________________________________   INITIAL IMPRESSION / ASSESSMENT AND PLAN / ED COURSE  Pertinent labs &  imaging results that were available during my care of the patient were reviewed by me and considered in my medical decision making (see chart for details).  Patient presents with severe hypoxia, likely COPD exacerbation versus pneumonia. We'll get labs chest x-ray ABG. Duo nebs and Solu-Medrol and IV magnesium sulfate for now. Due to the severity of her presentation I'll start IV Levaquin, although she does not meet sepsis criteria at this time  ----------------------------------------- 11:51 AM on 08/19/2015 -----------------------------------------  ABG shows PCO2 of 102. We'll start BiPAP. Patient currently is awake and alert and able to tolerate a trial of BiPAP, which should help avoid and improved CO2 narcosis.  ----------------------------------------- 12:45 PM on 08/19/2015 ----------------------------------------- Patient improving on BiPAP. Able to titrate FiO2 down to 35%. Labs significant for hyponatremia to 125 and hypokalemia to 2.7. We'll give IV saline for now, defer potassium correction to inpatient management. Case discussed with critical care to evaluate for admission.      ____________________________________________   FINAL CLINICAL IMPRESSION(S) / ED DIAGNOSES  Final diagnoses:  Acute on chronic respiratory failure with hypoxia and hypercapnia (HCC)  Hyponatremia  Hypokalemia       Portions of this note were generated with dragon dictation software. Dictation errors may occur despite best attempts at proofreading.   Sharman CheekPhillip Karrissa Parchment, MD 08/19/15 1253

## 2015-08-19 NOTE — Progress Notes (Signed)
Pt arrived from ED.  On Bipap. Resp even and labored.  Alert and oriented.  Multiple bruises noted to upper extremeities that appear to be from IV attempts.  Pt has bilateral AC IV's.  Pt denies need at this time.

## 2015-08-19 NOTE — ED Notes (Signed)
Patient to ER with PACE RN for c/o shortness of breath. Patient denies any known fevers, but has had cough for approx one week with thick white sputum production. Patient's aid went to check on patient and found her more slumped to left than usual and not responding adequately. Patient has congested lung sounds at this time. O2 sat initially at [facility] was in 40's. Patient received breathing treatment (didn't take her morning meds), sats increased to 70's. Patient's sats in triage in 70's (on 2L) with good wave form. Patient wears 2L O2 at all times at home.

## 2015-08-20 ENCOUNTER — Inpatient Hospital Stay: Payer: Medicare (Managed Care)

## 2015-08-20 DIAGNOSIS — R05 Cough: Secondary | ICD-10-CM

## 2015-08-20 LAB — BASIC METABOLIC PANEL
ANION GAP: 8 (ref 5–15)
BUN: 11 mg/dL (ref 6–20)
CHLORIDE: 81 mmol/L — AB (ref 101–111)
CO2: 43 mmol/L — ABNORMAL HIGH (ref 22–32)
Calcium: 8.4 mg/dL — ABNORMAL LOW (ref 8.9–10.3)
Creatinine, Ser: 0.53 mg/dL (ref 0.44–1.00)
GFR calc Af Amer: >60 mL/min (ref >60)
Glucose, Bld: 153 mg/dL — ABNORMAL HIGH (ref 65–99)
POTASSIUM: 3.2 mmol/L — AB (ref 3.5–5.1)
SODIUM: 132 mmol/L — AB (ref 135–145)

## 2015-08-20 LAB — CBC WITH DIFFERENTIAL/PLATELET
BASOS ABS: 0 10*3/uL (ref 0–0.1)
BASOS PCT: 0 %
EOS ABS: 0 10*3/uL (ref 0–0.7)
Eosinophils Relative: 0 %
HCT: 37 % (ref 35.0–47.0)
HEMOGLOBIN: 12.6 g/dL (ref 12.0–16.0)
Lymphocytes Relative: 3 %
Lymphs Abs: 0.3 10*3/uL — ABNORMAL LOW (ref 1.0–3.6)
MCH: 31.5 pg (ref 26.0–34.0)
MCHC: 34 g/dL (ref 32.0–36.0)
MCV: 92.8 fL (ref 80.0–100.0)
MONOS PCT: 4 %
Monocytes Absolute: 0.4 10*3/uL (ref 0.2–0.9)
NEUTROS PCT: 93 %
Neutro Abs: 9.9 10*3/uL — ABNORMAL HIGH (ref 1.4–6.5)
PLATELETS: 210 10*3/uL (ref 150–440)
RBC: 3.99 MIL/uL (ref 3.80–5.20)
RDW: 18.7 % — ABNORMAL HIGH (ref 11.5–14.5)
WBC: 10.6 10*3/uL (ref 3.6–11.0)

## 2015-08-20 LAB — BLOOD GAS, ARTERIAL
ACID-BASE EXCESS: 23.1 mmol/L — AB (ref 0.0–3.0)
Allens test (pass/fail): POSITIVE — AB
BICARBONATE: 49.8 meq/L — AB (ref 21.0–28.0)
Expiratory PAP: 8
FIO2: 0.35
Inspiratory PAP: 18
O2 SAT: 93.1 %
PATIENT TEMPERATURE: 37
PO2 ART: 60 mmHg — AB (ref 83.0–108.0)
pCO2 arterial: 61 mmHg — ABNORMAL HIGH (ref 32.0–48.0)
pH, Arterial: 7.52 — ABNORMAL HIGH (ref 7.350–7.450)

## 2015-08-20 LAB — MAGNESIUM: MAGNESIUM: 1.9 mg/dL (ref 1.7–2.4)

## 2015-08-20 MED ORDER — BACLOFEN 10 MG PO TABS
10.0000 mg | ORAL_TABLET | Freq: Two times a day (BID) | ORAL | Status: DC
  Administered 2015-08-20 – 2015-08-26 (×12): 10 mg via ORAL
  Filled 2015-08-20 (×12): qty 1

## 2015-08-20 MED ORDER — PREDNISONE 20 MG PO TABS
40.0000 mg | ORAL_TABLET | Freq: Every day | ORAL | Status: DC
  Administered 2015-08-21: 40 mg via ORAL
  Filled 2015-08-20: qty 2

## 2015-08-20 MED ORDER — PREDNISONE 10 MG PO TABS: 10.0000 mg | ORAL_TABLET | Freq: Every day | ORAL | Status: DC

## 2015-08-20 MED ORDER — SIMVASTATIN 20 MG PO TABS
20.0000 mg | ORAL_TABLET | Freq: Every day | ORAL | Status: DC
  Administered 2015-08-20 – 2015-08-25 (×6): 20 mg via ORAL
  Filled 2015-08-20 (×6): qty 1

## 2015-08-20 MED ORDER — POTASSIUM CHLORIDE CRYS ER 20 MEQ PO TBCR: 40.0000 meq | EXTENDED_RELEASE_TABLET | Freq: Once | ORAL | Status: DC

## 2015-08-20 MED ORDER — METHYLPREDNISOLONE SODIUM SUCC 125 MG IJ SOLR
60.0000 mg | Freq: Two times a day (BID) | INTRAMUSCULAR | Status: AC
  Administered 2015-08-20 (×2): 60 mg via INTRAVENOUS
  Filled 2015-08-20 (×2): qty 2

## 2015-08-20 MED ORDER — ACETAMINOPHEN 325 MG PO TABS
650.0000 mg | ORAL_TABLET | ORAL | Status: DC | PRN
  Administered 2015-08-20 – 2015-08-26 (×5): 650 mg via ORAL
  Filled 2015-08-20 (×5): qty 2

## 2015-08-20 MED ORDER — POTASSIUM CHLORIDE 10 MEQ/100ML IV SOLN
10.0000 meq | INTRAVENOUS | Status: AC
  Administered 2015-08-20 (×4): 10 meq via INTRAVENOUS
  Filled 2015-08-20 (×4): qty 100

## 2015-08-20 MED ORDER — PREDNISONE 5 MG PO TABS: 5.0000 mg | ORAL_TABLET | Freq: Every day | ORAL | Status: DC

## 2015-08-20 MED ORDER — PREDNISONE 20 MG PO TABS: 20.0000 mg | ORAL_TABLET | Freq: Every day | ORAL | Status: DC

## 2015-08-20 MED ORDER — METOPROLOL TARTRATE 25 MG PO TABS
25.0000 mg | ORAL_TABLET | Freq: Two times a day (BID) | ORAL | Status: DC
  Administered 2015-08-20 – 2015-08-26 (×13): 25 mg via ORAL
  Filled 2015-08-20 (×13): qty 1

## 2015-08-20 MED ORDER — MUPIROCIN 2 % EX OINT
TOPICAL_OINTMENT | Freq: Two times a day (BID) | CUTANEOUS | Status: DC
  Administered 2015-08-20 – 2015-08-26 (×13): via NASAL
  Filled 2015-08-20: qty 22

## 2015-08-20 MED ORDER — ALPRAZOLAM 0.25 MG PO TABS
0.2500 mg | ORAL_TABLET | Freq: Three times a day (TID) | ORAL | Status: DC | PRN
  Administered 2015-08-20 – 2015-08-26 (×9): 0.25 mg via ORAL
  Filled 2015-08-20 (×9): qty 1

## 2015-08-20 MED ORDER — BUDESONIDE 0.25 MG/2ML IN SUSP
0.2500 mg | Freq: Three times a day (TID) | RESPIRATORY_TRACT | Status: DC
  Administered 2015-08-20 (×2): 0.25 mg via RESPIRATORY_TRACT
  Filled 2015-08-20 (×3): qty 2

## 2015-08-20 MED ORDER — LEVOFLOXACIN 750 MG PO TABS
750.0000 mg | ORAL_TABLET | Freq: Every day | ORAL | Status: AC
  Administered 2015-08-20 – 2015-08-23 (×4): 750 mg via ORAL
  Filled 2015-08-20 (×3): qty 1
  Filled 2015-08-20: qty 2

## 2015-08-20 MED ORDER — ZOLPIDEM TARTRATE 5 MG PO TABS
5.0000 mg | ORAL_TABLET | Freq: Once | ORAL | Status: AC
  Administered 2015-08-20: 22:00:00 5 mg via ORAL
  Filled 2015-08-20: qty 1

## 2015-08-20 MED ORDER — NICOTINE 14 MG/24HR TD PT24
14.0000 mg | MEDICATED_PATCH | Freq: Every day | TRANSDERMAL | Status: DC
  Administered 2015-08-20 – 2015-08-26 (×7): 14 mg via TRANSDERMAL
  Filled 2015-08-20 (×7): qty 1

## 2015-08-20 MED ORDER — PREDNISONE 20 MG PO TABS: 30.0000 mg | ORAL_TABLET | Freq: Every day | ORAL | Status: DC

## 2015-08-20 NOTE — Progress Notes (Signed)
Update Patient stable to transfer out of the ICU. Spoke with Dr. Weiting, Hospitalist, transfer of care as of 7/9 AM.   Savaya Hakes, MD [facility] Pager - [phone] (please enter 7-digits) On Call Pager - [phone] (please enter 7-digits) 

## 2015-08-20 NOTE — Progress Notes (Signed)
[facility] LAB REPLACEMENT ONLY  The patient does not [facility] based on the criteria listed below:     Is urine output >/= 0.5 ml/kg/hr for the last 6 hours? No. Patient's UOP is unknown    Abnormal electrolyte(s):K 3.2   If a panic level lab has been reported, has the CCM MD in charge been notified? Yes.  .   Physician:  Laural BenesS Sommer, MD  [place] NakayamaChisholm, Cressida [place] William 08/20/2015 6:34 AM

## 2015-08-20 NOTE — Progress Notes (Signed)
PULMONARY / CRITICAL CARE MEDICINE   Name: Candace Woods MRN: [id number] DOB: December 03, 1940    ADMISSION DATE:  08/19/2015 CONSULTATION DATE:  08/19/2015  REFERRING MD:  Dr. Cherlynn KaiserSainani  CHIEF COMPLAINT:  Acute respiratory failure  HISTORY OF PRESENT ILLNESS:   This is a 75 yo female with PMH of COPD, HTN, seizures, peripheral neuropathy, right torticollis, anxiety, depression, psychogenic polydipsia, MRSA pneumonia (2014), hyperlipidemia, and asthma.  She presented to [facility] ER on 7/7 with increased lethargy and somnolence since this am and a productive cough onset 2 days ago. According to [facility] RN who follows patient daily she went to pick the patient up from her clinic visit today and noticed the pt was severely dyspneic, O2 sats were 50% pt was given a Duoneb treatment and placed back on 2L O2 via nasal canula.  Despite Duoneb treatment pt remained hypoxic with O2 sats at 65% pt was brought to ER and started on continuous Bipap.  PCCM admitting to stepdown unit for management of acute hypoxic hypercapnic respiratory failure secondary to COPD exacerbation and ?CAP.  Her primary pulmonologist is Dr. Meredeth IdeFleming.    PAST MEDICAL HISTORY :  She  has a past medical history of COPD (chronic obstructive pulmonary disease) (HCC); Hypertension; Seizures (HCC); Peripheral neuropathy (HCC); Torticollis (Right); Anxiety; Depression; Psychogenic polydipsia; MRSA pneumonia (HCC); Hyperlipemia; and Asthma.  PAST SURGICAL HISTORY: She  has past surgical history that includes Abdominal hysterectomy.  Allergies  Allergen Reactions  . Aspirin   . Codeine   . Contrast Media [Iodinated Diagnostic Agents] Hives    Hives with contrast media injections  . Penicillins   . Sulfa Antibiotics     No current facility-administered medications on file prior to encounter.   Current Outpatient Prescriptions on File Prior to Encounter  Medication Sig  . acetaminophen (TYLENOL) 650 MG CR tablet Take 650 mg by mouth every 12  (twelve) hours.  Marland Kitchen. albuterol (PROVENTIL HFA;VENTOLIN HFA) 108 (90 Base) MCG/ACT inhaler Inhale 2 puffs into the lungs every 6 (six) hours as needed for wheezing or shortness of breath.  Marland Kitchen. albuterol (PROVENTIL) (2.5 MG/3ML) 0.083% nebulizer solution Take 2.5 mg by nebulization every 4 (four) hours as needed for wheezing or shortness of breath.  . ALPRAZolam (XANAX) 0.5 MG tablet Take 1 tablet (0.5 mg total) by mouth 2 (two) times daily as needed for anxiety.  . baclofen (LIORESAL) 10 MG tablet Take 10 mg by mouth 2 (two) times daily. PT ALSO TAKES 1 TABLET AT BEDTIME PRN  . fexofenadine (ALLEGRA) 180 MG tablet Take 180 mg by mouth daily.  . Fluticasone-Salmeterol (ADVAIR) 250-50 MCG/DOSE AEPB Inhale 1 puff into the lungs 2 (two) times daily.  . furosemide (LASIX) 20 MG tablet Take 1 tablet (20 mg total) by mouth daily.  Marland Kitchen. gabapentin (NEURONTIN) 100 MG capsule Take 100 mg by mouth daily.  Marland Kitchen. guaiFENesin-dextromethorphan (ROBITUSSIN DM) 100-10 MG/5ML syrup Take 10 mLs by mouth every 6 (six) hours as needed for cough.  Marland Kitchen. ipratropium-albuterol (DUONEB) 0.5-2.5 (3) MG/3ML SOLN Take 3 mLs by nebulization every 4 (four) hours as needed (wheeze).  . metoprolol tartrate (LOPRESSOR) 25 MG tablet Take 1 tablet (25 mg total) by mouth 2 (two) times daily.  . mirtazapine (REMERON SOL-TAB) 15 MG disintegrating tablet Take 1 tablet (15 mg total) by mouth at bedtime.  Marland Kitchen. OLANZapine zydis (ZYPREXA) 5 MG disintegrating tablet Take 5 mg by mouth at bedtime.  . phenytoin (DILANTIN) 300 MG ER capsule Take 300 mg by mouth at bedtime.  .Marland Kitchen  ranitidine (ZANTAC) 150 MG tablet Take 150 mg by mouth 2 (two) times daily.  . sennosides-docusate sodium (SENOKOT-S) 8.6-50 MG tablet Take 1 tablet by mouth daily.  . sertraline (ZOLOFT) 100 MG tablet Take 100 mg by mouth daily.  . simvastatin (ZOCOR) 20 MG tablet Take 20 mg by mouth daily at 6 PM.  . sodium chloride (OCEAN) 0.65 % nasal spray Place 1 spray into the nose as needed. For  congestion  . sodium chloride 1 g tablet Take 1 tablet (1 g total) by mouth 2 (two) times daily with a meal.  . tamsulosin (FLOMAX) 0.4 MG CAPS capsule Take 0.4 mg by mouth daily.  Marland Kitchen tiotropium (SPIRIVA) 18 MCG inhalation capsule Place 18 mcg into inhaler and inhale daily.  Marland Kitchen zolpidem (AMBIEN) 5 MG tablet Take 5 mg by mouth at bedtime as needed for sleep.    FAMILY HISTORY:  Her has no family status information on file.   SOCIAL HISTORY: She  reports that she has been smoking Cigarettes.  She has a 110 pack-year smoking history. She does not have any smokeless tobacco history on file. She reports that she does not drink alcohol or use illicit drugs.  REVIEW OF SYSTEMS:   Gen: Denies fever, chills, weight change, fatigue, night sweats HEENT: Denies blurred vision, double vision, hearing loss, tinnitus, sinus congestion, rhinorrhea, sore throat, neck stiffness, dysphagia PULM: positive for shortness of breath, cough, sputum production, hemoptysis, wheezing CV: Denies chest pain, edema, orthopnea, paroxysmal nocturnal dyspnea, palpitations GI: Denies abdominal pain, nausea, vomiting, diarrhea, hematochezia, melena, constipation, change in bowel habits GU: Denies dysuria, hematuria, polyuria, oliguria, urethral discharge Endocrine: Denies hot or cold intolerance, polyuria, polyphagia or appetite change Derm: Denies rash, dry skin, scaling or peeling skin change Heme: Denies easy bruising, bleeding, bleeding gums Neuro: Denies headache, numbness, weakness, slurred speech, loss of memory or consciousness  SUBJECTIVE:  Pt currently on 2L O2 via nasal canula tolerating well no acute distress.  She is drinking fluids and tolerating well  VITAL SIGNS: BP 154/77 mmHg  Pulse 83  Temp(Src) 98.3 F (36.8 C) (Axillary)  Resp 16  Ht  (1.626 m)  Wt 163 lb 2.3 oz (74 kg)  BMI 27.99 kg/m2  SpO2 98%  HEMODYNAMICS:    VENTILATOR SETTINGS: Vent Mode:  [-]  FiO2 (%):  [35 %] 35 %  INTAKE  / OUTPUT: I/O last 3 completed shifts: In: 1135 [I.V.:1135] Out: 2150 [Urine:2150]  PHYSICAL EXAMINATION: General:  Chronically ill appearing female currently on nasal canula Neuro: Alert and awake, follows commands  HEENT:  Supple, no JVD Cardiovascular:  s1s2, rrrr, no M/R/G Lungs:  Diffuse wheezes throughout, even, non labored  Abdomen: +BS x4, soft, non tender, non distended Musculoskeletal:  Normal bulk and tone, trace bilateral lower extremity edema Skin:  Intact, no rash  LABS:  BMET  Recent Labs Lab 08/19/15 1129 08/19/15 1729 08/20/15 0504  NA 125*  --  132*  K 2.7* 2.9* 3.2*  CL 70*  --  81*  CO2 47*  --  43*  BUN 9  --  11  CREATININE 0.55  --  0.53  GLUCOSE 221*  --  153*    Electrolytes  Recent Labs Lab 08/19/15 1129 08/19/15 1729 08/20/15 0504  CALCIUM 8.7*  --  8.4*  MG 1.6* 2.0 1.9    CBC  Recent Labs Lab 08/19/15 1129 08/20/15 0504  WBC 14.8* 10.6  HGB 13.8 12.6  HCT 41.2 37.0  PLT 236 210  Coag's No results for input(s): APTT, INR in the last 168 hours.  Sepsis Markers  Recent Labs Lab 08/19/15 1129 08/19/15 1454  LATICACIDVEN 1.2 1.1  PROCALCITON  --  <0.10    ABG  Recent Labs Lab 08/20/15 0500  PHART 7.52*  PCO2ART 61*  PO2ART 60*    Liver Enzymes No results for input(s): AST, ALT, ALKPHOS, BILITOT, ALBUMIN in the last 168 hours.  Cardiac Enzymes  Recent Labs Lab 08/19/15 1129  TROPONINI 0.04*    Glucose  Recent Labs Lab 08/19/15 1528  GLUCAP 139*    Imaging Dg Chest Port 1 View  08/20/2015  CLINICAL DATA:  75 year old female with respiratory failure. Productive cough and lethargy for 2 days. COPD. Query community-acquired pneumonia. Initial encounter. EXAM: PORTABLE CHEST 1 VIEW COMPARISON:  08/19/2015 and earlier. FINDINGS: Portable AP semi upright view at 0533 hours. Chronic large lung volumes. Upper lobe predominant emphysema and right lower lobe predominant bronchiectasis demonstrated by CT  earlier this year. Stable cardiac size and mediastinal contours. Calcified aortic atherosclerosis. No pneumothorax or pulmonary edema. No pleural effusion or consolidation, but there is mild increased curvilinear opacity at the right lung base. IMPRESSION: 1. Mild increased curvilinear opacity at the right lung base compatible with acute infectious exacerbation in this setting. No pleural effusion. 2. Chronic lung disease including emphysema and bronchiectasis. 3. Calcified aortic atherosclerosis. Electronically Signed   By: Odessa Fleming M.D.   On: 08/20/2015 07:21   Dg Chest Portable 1 View  08/19/2015  CLINICAL DATA:  Shortness of breath and cough for 1 week. History of COPD, hypertension, seizure, asthma. Current everyday smoker. EXAM: PORTABLE CHEST 1 VIEW COMPARISON:  Chest x-rays dated 05/31/2015 and 09/11/2012. FINDINGS: Cardiomegaly is stable. Atherosclerotic changes again noted at the aortic arch. Lungs are hyperexpanded compatible with given history of COPD. Suspect associated chronic interstitial lung disease and chronic bronchitic changes centrally. No new lung findings. No evidence of pneumonia. No pleural effusion or pneumothorax seen. Osseous and soft tissue structures about the chest are unremarkable. IMPRESSION: 1. No acute findings.  No evidence of pneumonia. 2. Stable cardiomegaly. 3. Aortic atherosclerosis. 4. Hyperexpanded lungs indicating COPD. Suspect associated chronic interstitial lung disease and chronic bronchitic changes. Electronically Signed   By: Bary Richard M.D.   On: 08/19/2015 11:56     STUDIES:  None  CULTURES: Blood x2 7/7>> Urine 7/7>> Sputum 7/7>> MRSA 7/7>>positive  ANTIBIOTICS: Levaquin 7/7>>7/12  SIGNIFICANT EVENTS: -This is a 75 yo female admitted on 7/7 due to acute hypoxic hypercapnic respiratory failure secondary to COPD exacerbation and ?CAP. -PCCM admitted pt to stepdown 7/7  LINES/TUBES: PIV  DISCUSSION: This is a 75 yo female admitted on 7/7 due  to acute hypoxic hypercapnic respiratory failure secondary to COPD exacerbation and ?CAP.  PCCM admitted pt to stepdown 7/7  ASSESSMENT / PLAN:  PULMONARY A: Acute hypoxic hypercapnic respiratory failure secondary to COPD exacerbation and ?CAP Hx: Asthma, MRSA Pneumonia (2014) P:   Bipap qhs for minimum of 4 hrs  Transition back to baseline 2L O2 via nasal canula as tolerated  Maintain O2 sats 88% to 92% Pulmicort, duonebs, and solumedrol Intermittent CXR Pulmonary Hygiene Physical Therapy Consulted  CARDIOVASCULAR A:  Hx: HTN, Hyperlipidemia P:  Restart outpatient Zocor and Metoprolol  Telemetry monitoring  RENAL A:   Hyponatremia-improving Hypokalemia Hypomagnesium-resolved P:   Trend BMP;s Replace electrolytes Monitor UOP LR 20 meq K+ at 75 ml/hr -D/C 7/8.  GASTROINTESTINAL A:   No acute issues P:   Heart Healthy Diet  HEMATOLOGIC A:   No acute issues P:  Lovenox for VTE prophylaxis Monitor for s/sx of bleeding Transfuse for HgB <7  INFECTIOUS A:  Leukocytosis-no current infiltrate on CXR 7/7 P:   Trend WBC's and monitor fever curve  Continue ABX for now  Trend PCT's and lactic acid. PCT <0.1. Levaquin for AECOPD and inflammatory effects.  Follow blood, sputum, and urine cultures  ENDOCRINE A:   Hyperglycemia  P:   CBG's q4hrs  NEUROLOGIC A:   Somnolence  P:   RASS goal: N/A Avoid sedating medications Keep lights on during the day Reorient pt frequently    FAMILY  - Updates: Pt and family updated about plan of care and questions answered -Will transfer pt to any med surg with off unit telemetry transfer services to the hospitalist in the am 08/21/2015  - Inter-disciplinary family meet or Palliative Care meeting due by:  08/26/15    Sonda Rumble, AGNP  Pulmonary/Critical Care Pager [phone] (please enter 7 digits) PCCM Consult Pager [phone] (please enter 7 digits)     STAFF NOTE: I, Dr. Stephanie Acre have personally  reviewed patient's available data, including medical history, events of note, physical examination and test results as part of my evaluation. I have discussed with NP Blakeney and other care providers such as pharmacist, RN and RRT.     A: 75 year old female past medical history of COPD on 2 L oxygen, hypertension, seizures, peripheral neuropathy, tobacco abuse, history of MRSA pneumonia, admitted for acute and chronic hypercapnic and hypoxic respiratory failure.  Acute on chronic hypoxic/hypercapnic respiratory failure- resolving COPD exacerbation COPD Hypoxia Tobacco abuse Chronic respiratory failure on 2 L Peripheral neuropathy Anxiety Depression Hyperlipidemia Chronic cough HTN  P:  -Suspect that her respiratory failure is due to continued tobacco abuse and possibly rapid weather changes over the past 3-4 days. -Continue with IV steroids for today. Starting 7/9 start prednisone tapering dose, taper 40 mg over 10 days. Stop Pulmicort nebulizers 7/9. Continue with when necessary DuoNeb's. -Patient with chronic smoker's cough, continue with flutter valve and incentive spirometry -Levaquin for a total of 5 days. -Patient back to baseline, stable to transfer out of the ICU. -Continue home hypertension meds -Patient follows with Dr. Meredeth Ide from the pulmonary services, he has been informed. -patient has MOS Form and DNR forms, DO NOT RESUSCITATE orders entered -Daughters updated at bedside   .  Rest per NP/medical resident whose note is outlined above and that I agree with  The patient is critically ill with multiple organ systems failure and requires high complexity decision making for assessment and support, frequent evaluation and titration of therapies, application of advanced monitoring technologies and extensive interpretation of multiple databases.   Critical Care Time devoted to patient care services described in this note is  35 Minutes.   This time reflects time of care  of this signee Dr Stephanie Acre.  This critical care time does not reflect procedure time, or teaching time or supervisory time of PA/NP/Med-student/Med Resident etc but could involve care discussion time.  Stephanie Acre, MD [facility] Pager [phone] (please enter 7-digits) On Call Pager - [phone] (please enter 7-digits)  Note: This note was prepared with Dragon dictation along with smaller phrase technology. Any transcriptional errors that result from this process are unintentional.

## 2015-08-20 NOTE — Evaluation (Signed)
Physical Therapy Evaluation Patient Details Name: VENETTA KNEE MRN: [id number] DOB: [birth year] Today's Date: 08/20/2015   History of Present Illness  This is a 75 yo female with PMH of COPD, HTN, seizures, peripheral neuropathy, right torticollis, anxiety, depression, psychogenic polydipsia, MRSA pneumonia (2014), hyperlipidemia, and asthma. She presented to [facility] ER on 7/7 with increased lethargy and somnolence since this am and a productive cough onset 2 days ago. Patient admitted with  acute respiratory failure.   Clinical Impression  75 yo Female came to ED with increased shortness of breath and was admitted with acute respiratory failure. Patient is a chronic smoker. She reports needing assistance at baseline and has home health aide who comes daily for about 5-8 hours a day. She reports using rollator for ambulation at baseline. Currently she is mod I for bed mobility using bed rails. She does require min A for sit<>stand transfer. Patient is mod A for gait (3 feet) to bedside chair requiring assistance for balance with decreased trunk control and short shuffled step. Patient demonstrates impaired standing balance with unsteadiness on feet and increased ataxia throughout trunk. She also demonstrates weakness in BLE grossly 4-/5. She would benefit from additional skilled PT intervention to improve balance/gait safety and reduce fall risk.     Follow Up Recommendations Home health PT;Supervision/Assistance - 24 hour    Equipment Recommendations  None recommended by PT    Recommendations for Other Services Rehab consult     Precautions / Restrictions Precautions Precautions: Fall Restrictions Weight Bearing Restrictions: No      Mobility  Bed Mobility Overal bed mobility: Modified Independent             General bed mobility comments: uses bed rails and elevated head of bed but able to transition supine to sitting edge of bed;   Transfers Overall transfer level: Needs  assistance Equipment used: Rolling walker (2 wheeled) Transfers: Sit to/from Stand Sit to Stand: Min assist         General transfer comment: Patient requires cues for hand placement and to improve trunk control for better balance;   Ambulation/Gait Ambulation/Gait assistance: Mod assist Ambulation Distance (Feet): 3 Feet Assistive device: Rolling walker (2 wheeled) Gait Pattern/deviations: Step-to pattern;Decreased step length - right;Decreased step length - left;Shuffle;Trunk flexed;Decreased stride length Gait velocity: decreased   General Gait Details: slower gait speed ambulated from bed to chair with RW, min A for balance demonstrating decreased trunk control;   Stairs            Wheelchair Mobility    Modified Rankin (Stroke Patients Only)       Balance Overall balance assessment: Needs assistance Sitting-balance support: Bilateral upper extremity supported Sitting balance-Leahy Scale: Fair   Postural control: Posterior lean Standing balance support: Bilateral upper extremity supported Standing balance-Leahy Scale: Fair Standing balance comment: requires min A to keep balance in static standing with RW                              Pertinent Vitals/Pain Pain Assessment: No/denies pain    Home Living Family/patient expects to be discharged to:: Private residence Living Arrangements: Alone Available Help at Discharge:  (has home health aide about 8 hours a day) Type of Home: Apartment Home Access: Stairs to enter   Entrance Stairs-Number of Steps: 2 Home Layout: One level Home Equipment: Walker - 4 wheels;Shower seat;Bedside commode;Grab bars - tub/shower;Grab bars - toilet;Hand held shower head  Prior Function Level of Independence: Needs assistance   Gait / Transfers Assistance Needed: uses rollator for ambulation;   ADL's / Homemaking Assistance Needed: has assistance from home health aide with bathing and dressing; Does need help  with meal prep and housework;         Hand Dominance        Extremity/Trunk Assessment   Upper Extremity Assessment: Overall WFL for tasks assessed           Lower Extremity Assessment: Generalized weakness (grossly 4-/5 bilaterally;)      Cervical / Trunk Assessment: Kyphotic;Other exceptions  Communication   Communication: No difficulties  Cognition Arousal/Alertness: Lethargic Behavior During Therapy: Agitated Overall Cognitive Status: Within Functional Limits for tasks assessed                      General Comments      Exercises        Assessment/Plan    PT Assessment Patient needs continued PT services  PT Diagnosis Difficulty walking;Generalized weakness   PT Problem List Decreased strength;Decreased activity tolerance;Decreased balance;Decreased mobility;Decreased safety awareness  PT Treatment Interventions DME instruction;Gait training;Stair training;Functional mobility training;Therapeutic activities;Therapeutic exercise;Patient/family education;Neuromuscular re-education;Balance training   PT Goals (Current goals can be found in the Care Plan section) Acute Rehab PT Goals Patient Stated Goal: "I want to go back home." PT Goal Formulation: With patient Time For Goal Achievement: 09/03/15 Potential to Achieve Goals: Good    Frequency Min 2X/week   Barriers to discharge Decreased caregiver support;Inaccessible home environment has 2 steps to enter; does have home health aide during day but is alone at night;     Co-evaluation               End of Session Equipment Utilized During Treatment: Gait belt Activity Tolerance: Patient limited by fatigue Patient left: in chair;with call bell/phone within reach;with chair alarm set Nurse Communication: Mobility status         Time: [id number] PT Time Calculation (min) (ACUTE ONLY): 20 min   Charges:   PT Evaluation $PT Eval Low Complexity: 1 Procedure     PT G Codes:          Trotter,Margaret  PT, DPT  08/20/2015, 4:40 PM

## 2015-08-20 NOTE — Progress Notes (Signed)
eLink Physician-Brief Progress Note Patient Name: Loraine GripDonna M Desrosiers DOB: Jan 31, 1941 MRN: [id number]   Date of Service  08/20/2015  HPI/Events of Note  K+ = 3.2 and Creatinine = 0.53.  eICU Interventions  Will replete K+.     Intervention Category Intermediate Interventions: Electrolyte abnormality - evaluation and management  Sommer,Steven Eugene 08/20/2015, 6:51 AM

## 2015-08-20 NOTE — Progress Notes (Signed)
Patient transferred from CCU. Farhiya Rosten S, RN  

## 2015-08-20 NOTE — Progress Notes (Signed)
Family called twice to inquire about patient's condition. Slept well with bipap at 35%. K+ looks better today. Urine output high at beginning of shift, but only 100 ml out this AM. Bun/creatitine abnormal.

## 2015-08-21 ENCOUNTER — Inpatient Hospital Stay: Payer: Medicare (Managed Care)

## 2015-08-21 LAB — BASIC METABOLIC PANEL
ANION GAP: 5 (ref 5–15)
BUN: 16 mg/dL (ref 6–20)
CALCIUM: 8.3 mg/dL — AB (ref 8.9–10.3)
CO2: 43 mmol/L — AB (ref 22–32)
Chloride: 85 mmol/L — ABNORMAL LOW (ref 101–111)
Creatinine, Ser: 0.53 mg/dL (ref 0.44–1.00)
GFR calc non Af Amer: >60 mL/min (ref >60)
Glucose, Bld: 140 mg/dL — ABNORMAL HIGH (ref 65–99)
POTASSIUM: 3.5 mmol/L (ref 3.5–5.1)
Sodium: 133 mmol/L — ABNORMAL LOW (ref 135–145)

## 2015-08-21 LAB — CBC WITH DIFFERENTIAL/PLATELET
BASOS ABS: 0 10*3/uL (ref 0–0.1)
BASOS PCT: 0 %
Eosinophils Absolute: 0 10*3/uL (ref 0–0.7)
Eosinophils Relative: 0 %
HEMATOCRIT: 37.2 % (ref 35.0–47.0)
HEMOGLOBIN: 12.4 g/dL (ref 12.0–16.0)
LYMPHS PCT: 8 %
Lymphs Abs: 0.6 10*3/uL — ABNORMAL LOW (ref 1.0–3.6)
MCH: 31.3 pg (ref 26.0–34.0)
MCHC: 33.4 g/dL (ref 32.0–36.0)
MCV: 93.8 fL (ref 80.0–100.0)
MONO ABS: 0.4 10*3/uL (ref 0.2–0.9)
Monocytes Relative: 5 %
NEUTROS ABS: 6.5 10*3/uL (ref 1.4–6.5)
NEUTROS PCT: 87 %
Platelets: 210 10*3/uL (ref 150–440)
RBC: 3.96 MIL/uL (ref 3.80–5.20)
RDW: 19.1 % — AB (ref 11.5–14.5)
WBC: 7.6 10*3/uL (ref 3.6–11.0)

## 2015-08-21 MED ORDER — SENNOSIDES-DOCUSATE SODIUM 8.6-50 MG PO TABS
1.0000 | ORAL_TABLET | Freq: Two times a day (BID) | ORAL | Status: DC
  Administered 2015-08-21 – 2015-08-23 (×5): 1 via ORAL
  Filled 2015-08-21 (×7): qty 1

## 2015-08-21 MED ORDER — MIRTAZAPINE 15 MG PO TABS
15.0000 mg | ORAL_TABLET | Freq: Every day | ORAL | Status: DC
  Administered 2015-08-21 – 2015-08-25 (×5): 15 mg via ORAL
  Filled 2015-08-21 (×5): qty 1

## 2015-08-21 MED ORDER — BUDESONIDE 0.25 MG/2ML IN SUSP
0.2500 mg | Freq: Two times a day (BID) | RESPIRATORY_TRACT | Status: DC
  Administered 2015-08-21 – 2015-08-26 (×11): 0.25 mg via RESPIRATORY_TRACT
  Filled 2015-08-21 (×10): qty 2

## 2015-08-21 MED ORDER — POTASSIUM CHLORIDE 20 MEQ PO PACK
40.0000 meq | PACK | Freq: Once | ORAL | Status: AC
  Administered 2015-08-21: 40 meq via ORAL
  Filled 2015-08-21: qty 2

## 2015-08-21 MED ORDER — FUROSEMIDE 20 MG PO TABS
20.0000 mg | ORAL_TABLET | Freq: Every day | ORAL | Status: DC
  Administered 2015-08-21 – 2015-08-26 (×6): 20 mg via ORAL
  Filled 2015-08-21 (×6): qty 1

## 2015-08-21 NOTE — Progress Notes (Signed)
[name], female   DOB: 1940-12-15, 75 y.o.   MRN: [id number] [facility] PROGRESS NOTE  Candace Woods WUJ:[id number] DOB: 1940-12-15 DOA: 08/19/2015 PCP: Bobbye MortonSHARON A REILLY, MD  HPI/Subjective: Patient feels her breathing is better than when she came in. Daughter states that she continues to smoke and take her oxygen off. She is concerned that her walker is on the [facility] program's van  Objective: Filed Vitals:   08/20/15 2103 08/21/15 0519  BP: 152/85 145/73  Pulse: 88 87  Temp: 98 F (36.7 C) 98 F (36.7 C)  Resp: 20 20    Filed Weights   08/19/15 1536 08/20/15 0413 08/21/15 0611  Weight: 75.1 kg (165 lb 9.1 oz) 74 kg (163 lb 2.3 oz) 71.725 kg (158 lb 2 oz)    ROS: Review of Systems  Constitutional: Negative for fever and chills.  Eyes: Negative for blurred vision.  Respiratory: Positive for shortness of breath. Negative for cough.   Cardiovascular: Negative for chest pain.  Gastrointestinal: Positive for constipation. Negative for nausea, vomiting, abdominal pain and diarrhea.  Genitourinary: Negative for dysuria.  Musculoskeletal: Negative for joint pain.  Neurological: Negative for dizziness and headaches.   Exam: Physical Exam  Constitutional: She is oriented to person, place, and time.  HENT:  Nose: No mucosal edema.  Mouth/Throat: No oropharyngeal exudate or posterior oropharyngeal edema.  Eyes: Conjunctivae, EOM and lids are normal. Pupils are equal, round, and reactive to light.  Neck: No JVD present. Carotid bruit is not present. No edema present. No thyroid mass and no thyromegaly present.  Cardiovascular: S1 normal and S2 normal.  Exam reveals no gallop.   No murmur heard. Pulses:      Dorsalis pedis pulses are 2+ on the right side, and 2+ on the left side.  Respiratory: No respiratory distress. She has decreased breath sounds in the right lower field and the left lower field. She has wheezes in the right middle field, the right lower field,  the left middle field and the left lower field. She has no rhonchi. She has no rales.  GI: Soft. Bowel sounds are normal. There is no tenderness.  Musculoskeletal:       Right ankle: She exhibits swelling.       Left ankle: She exhibits swelling.  Lymphadenopathy:    She has no cervical adenopathy.  Neurological: She is alert and oriented to person, place, and time. No cranial nerve deficit.  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  Skin: Skin is warm. No rash noted. Nails show no clubbing.  Psychiatric: She has a normal mood and affect.      Data Reviewed: Basic Metabolic Panel:  Recent Labs Lab 08/19/15 1129 08/19/15 1729 08/20/15 0504 08/21/15 0446  NA 125*  --  132* 133*  K 2.7* 2.9* 3.2* 3.5  CL 70*  --  81* 85*  CO2 47*  --  43* 43*  GLUCOSE 221*  --  153* 140*  BUN 9  --  11 16  CREATININE 0.55  --  0.53 0.53  CALCIUM 8.7*  --  8.4* 8.3*  MG 1.6* 2.0 1.9  --    CBC:  Recent Labs Lab 08/19/15 1129 08/20/15 0504 08/21/15 0446  WBC 14.8* 10.6 7.6  NEUTROABS 12.9* 9.9* 6.5  HGB 13.8 12.6 12.4  HCT 41.2 37.0 37.2  MCV 93.1 92.8 93.8  PLT 236 210 210   Cardiac Enzymes:  Recent Labs Lab 08/19/15 1129  TROPONINI 0.04*  BNP (last 3 results)  Recent Labs  05/22/15 0821  BNP 359.0*     CBG:  Recent Labs Lab 08/19/15 1528  GLUCAP 139*    Recent Results (from the past 240 hour(s))  Culture, blood (routine x 2)     Status: None (Preliminary result)   Collection Time: 08/19/15 11:29 AM  Result Value Ref Range Status   Specimen Description BLOOD LEFT ANTECUBITAL  Final   Special Requests BOTTLES DRAWN AEROBIC AND ANAEROBIC  6CC  Final   Culture NO GROWTH < 24 HOURS  Final   Report Status PENDING  Incomplete  Culture, blood (routine x 2)     Status: None (Preliminary result)   Collection Time: 08/19/15 11:29 AM  Result Value Ref Range Status   Specimen Description BLOOD LEFT HAND  Final   Special Requests BOTTLES DRAWN AEROBIC AND  ANAEROBIC  5CC  Final   Culture NO GROWTH < 24 HOURS  Final   Report Status PENDING  Incomplete  MRSA PCR Screening     Status: Abnormal   Collection Time: 08/19/15  3:32 PM  Result Value Ref Range Status   MRSA by PCR POSITIVE (A) NEGATIVE Final    Comment:        The GeneXpert MRSA Assay (FDA approved for NASAL specimens only), is one component of a comprehensive MRSA colonization surveillance program. It is not intended to diagnose MRSA infection nor to guide or monitor treatment for MRSA infections. RESULT CALLED TO, READ BACK BY AND VERIFIED WITH: ADAM [facility] 08/19/15 @ 1652  MLK      Studies: Dg Chest Port 1 View  08/20/2015  CLINICAL DATA:  75 year old female with respiratory failure. Productive cough and lethargy for 2 days. COPD. Query community-acquired pneumonia. Initial encounter. EXAM: PORTABLE CHEST 1 VIEW COMPARISON:  08/19/2015 and earlier. FINDINGS: Portable AP semi upright view at 0533 hours. Chronic large lung volumes. Upper lobe predominant emphysema and right lower lobe predominant bronchiectasis demonstrated by CT earlier this year. Stable cardiac size and mediastinal contours. Calcified aortic atherosclerosis. No pneumothorax or pulmonary edema. No pleural effusion or consolidation, but there is mild increased curvilinear opacity at the right lung base. IMPRESSION: 1. Mild increased curvilinear opacity at the right lung base compatible with acute infectious exacerbation in this setting. No pleural effusion. 2. Chronic lung disease including emphysema and bronchiectasis. 3. Calcified aortic atherosclerosis. Electronically Signed   By: Odessa Fleming M.D.   On: 08/20/2015 07:21   Dg Chest Portable 1 View  08/19/2015  CLINICAL DATA:  Shortness of breath and cough for 1 week. History of COPD, hypertension, seizure, asthma. Current everyday smoker. EXAM: PORTABLE CHEST 1 VIEW COMPARISON:  Chest x-rays dated 05/31/2015 and 09/11/2012. FINDINGS: Cardiomegaly is stable.  Atherosclerotic changes again noted at the aortic arch. Lungs are hyperexpanded compatible with given history of COPD. Suspect associated chronic interstitial lung disease and chronic bronchitic changes centrally. No new lung findings. No evidence of pneumonia. No pleural effusion or pneumothorax seen. Osseous and soft tissue structures about the chest are unremarkable. IMPRESSION: 1. No acute findings.  No evidence of pneumonia. 2. Stable cardiomegaly. 3. Aortic atherosclerosis. 4. Hyperexpanded lungs indicating COPD. Suspect associated chronic interstitial lung disease and chronic bronchitic changes. Electronically Signed   By: Bary  M.D.   On: 08/19/2015 11:56    Scheduled Meds: . baclofen  10 mg Oral BID  . budesonide (PULMICORT) nebulizer solution  0.25 mg Nebulization BID  . enoxaparin (LOVENOX) injection  40 mg Subcutaneous Q24H  .  furosemide  20 mg Oral Daily  . levofloxacin  750 mg Oral Daily  . metoprolol tartrate  25 mg Oral BID  . mirtazapine  15 mg Oral QHS  . mupirocin ointment   Nasal BID  . nicotine  14 mg Transdermal Daily  . phenytoin  300 mg Oral QHS  . potassium chloride  40 mEq Oral Once  . predniSONE  40 mg Oral Daily   Followed by  . [START ON 08/23/2015] predniSONE  30 mg Oral Daily   Followed by  . [START ON 08/25/2015] predniSONE  20 mg Oral Daily   Followed by  . [START ON 08/27/2015] predniSONE  10 mg Oral Daily  . [START ON 08/29/2015] predniSONE  5 mg Oral Q breakfast  . senna-docusate  1 tablet Oral BID  . simvastatin  20 mg Oral QHS    Assessment/Plan:  1. Acute on chronic respiratory failure with hypercapnia. Patient required BiPAP on presentation. Now back to her baseline oxygen 2 L nasal cannula. 2. COPD exacerbation. Patient now on prednisone taper as per critical care specialist. Restart budesonide nebulizers since still wheezing. Continue Levaquin. 3. Tobacco abuse smoking cessation counseling done 3 minutes by me  4. Seizure disorder on  Dilantin 5. Anxiety depression. I asked the pharmacist to reconcile the medications. I really ordered Remeron at night. He will look at the other 2 medications to see if they are active medications or not. 6. Psychogenic polydipsia 7. History of neuropathy 8. Weakness. Patient follows with the pace program at home and has aides. She walks with a walker and concerned that her walker is on the [facility] [place]. Family will pick up a Technical brewer. 9. Lower extremity edema. Restart the patient's oral Lasix  Code Status:     Code Status Orders        Start     Ordered   08/19/15 1318  Do not attempt resuscitation (DNR)   Continuous    Question Answer Comment  In the event of cardiac or respiratory ARREST Do not call a "code blue"   In the event of cardiac or respiratory ARREST Do not perform Intubation, CPR, defibrillation or ACLS   In the event of cardiac or respiratory ARREST Use medication by any route, position, wound care, and other measures to relive pain and suffering. May use oxygen, suction and manual treatment of airway obstruction as needed for comfort.      08/19/15 1317    Code Status History    Date Active Date Inactive Code Status Order ID Comments User Context   08/19/2015  1:04 PM 08/19/2015  1:17 PM Full Code [id number]  Eugenie Norrie, NP ED   05/22/2015  2:47 PM 06/07/2015  5:45 PM DNR [id number]  Stephanie Acre, MD Inpatient   05/22/2015 11:15 AM 05/22/2015  2:47 PM Full Code [id number]  Wyatt Haste, MD ED    Advance Directive Documentation        Most Recent Value   Type of Advance Directive  Out of facility DNR (pink MOST or yellow form)   Pre-existing out of facility DNR order (yellow form or pink MOST form)     "MOST" Form in Place?       Family Communication: Spoke with the daughter on the phone Disposition Plan: Potentially home Monday or Tuesday.  Antibiotics:  Levaquin  Time spent: 25 minutes  Alford Highland  [employer]

## 2015-08-21 NOTE — Care Management Note (Signed)
Case Management Note  Patient Details  Name: Candace Woods MRN: [id number] Date of Birth: 03/23/1940  Subjective/Objective:     [name] [facility] The plan is for a [facility] member [facility].               Action/Plan:   Expected Discharge Date:                  Expected Discharge Plan:     In-House Referral:     Discharge planning Services     Post Acute Care Choice:    Choice offered to:     DME Arranged:    DME Agency:     HH Arranged:    HH Agency:     Status of Service:     If discussed at [employer] Length of Stay Meetings, dates discussed:    Additional Comments:  [name], RN 08/21/2015, 4:21 PM

## 2015-08-22 LAB — URINE CULTURE: Culture: NO GROWTH

## 2015-08-22 MED ORDER — SERTRALINE HCL 100 MG PO TABS
100.0000 mg | ORAL_TABLET | Freq: Every day | ORAL | Status: DC
  Administered 2015-08-22 – 2015-08-26 (×5): 100 mg via ORAL
  Filled 2015-08-22 (×5): qty 1

## 2015-08-22 MED ORDER — POLYETHYLENE GLYCOL 3350 17 G PO PACK
17.0000 g | PACK | Freq: Every day | ORAL | Status: DC
  Administered 2015-08-22 – 2015-08-23 (×2): 17 g via ORAL
  Filled 2015-08-22 (×2): qty 1

## 2015-08-22 MED ORDER — OLANZAPINE 5 MG PO TBDP
5.0000 mg | ORAL_TABLET | Freq: Every day | ORAL | Status: DC
  Administered 2015-08-22 – 2015-08-25 (×4): 5 mg via ORAL
  Filled 2015-08-22 (×4): qty 1

## 2015-08-22 MED ORDER — GABAPENTIN 100 MG PO CAPS
100.0000 mg | ORAL_CAPSULE | Freq: Every day | ORAL | Status: DC
  Administered 2015-08-22 – 2015-08-25 (×4): 100 mg via ORAL
  Filled 2015-08-22 (×4): qty 1

## 2015-08-22 MED ORDER — METHYLPREDNISOLONE SODIUM SUCC 125 MG IJ SOLR
60.0000 mg | Freq: Three times a day (TID) | INTRAMUSCULAR | Status: DC
  Administered 2015-08-22 – 2015-08-25 (×10): 60 mg via INTRAVENOUS
  Filled 2015-08-22 (×10): qty 2

## 2015-08-22 NOTE — Consult Note (Signed)
Patient to be followed by Dr Isaiah SergeFleming [facility] [facility] will sign off at this time

## 2015-08-22 NOTE — Progress Notes (Signed)
PT Cancellation Note  Patient Details Name: Candace Woods MRN: [id number] DOB: 10-12-1940   Cancelled Treatment:    Reason Eval/Treat Not Completed: Patient declined, no reason specified Pt only stating did not want to do anything at this time, denies pain/discomfort/etc.  Will continue efforts.    Adrinne Sze 08/22/2015, 9:15 AM  Reeves Musick, PTA

## 2015-08-22 NOTE — Care Management Important Message (Signed)
Important Message  Patient Details  Name: Candace Woods MRN: [id number] Date of Birth: 1941/01/07   Medicare Important Message Given:  Yes    Gwenette GreetBrenda S Bridgit Eynon, RN 08/22/2015, 8:53 AM

## 2015-08-22 NOTE — Progress Notes (Signed)
[name], female   DOB: 1940/09/28, 75 y.o.   MRN: [id number] [facility] PROGRESS NOTE  Candace Woods:[id number] DOB: 03/06/1940 DOA: 08/19/2015 PCP: Bobbye Morton, MD  HPI/Subjective: Patient seen earlier today. States she wasn't feeling well. States she oh wheeze wheezes even at home. Her daughter says the same thing that she always wheezes. The patient continues to smoke even at home.  Objective: Filed Vitals:   08/21/15 2024 08/22/15 0433  BP: 114/59 135/72  Pulse: 96 87  Temp: 98.3 F (36.8 C) 98 F (36.7 C)  Resp: 18 18    Filed Weights   08/20/15 0413 08/21/15 0611 08/22/15 0457  Weight: 74 kg (163 lb 2.3 oz) 71.725 kg (158 lb 2 oz) 72.349 kg (159 lb 8 oz)    ROS: Review of Systems  Constitutional: Negative for fever and chills.  Eyes: Negative for blurred vision.  Respiratory: Positive for shortness of breath and wheezing. Negative for cough.   Cardiovascular: Negative for chest pain.  Gastrointestinal: Positive for constipation. Negative for nausea, vomiting, abdominal pain and diarrhea.  Genitourinary: Negative for dysuria.  Musculoskeletal: Negative for joint pain.  Neurological: Negative for dizziness and headaches.   Exam: Physical Exam  Constitutional: She is oriented to person, place, and time.  HENT:  Nose: No mucosal edema.  Mouth/Throat: No oropharyngeal exudate or posterior oropharyngeal edema.  Eyes: Conjunctivae, EOM and lids are normal. Pupils are equal, round, and reactive to light.  Neck: No JVD present. Carotid bruit is not present. No edema present. No thyroid mass and no thyromegaly present.  Cardiovascular: S1 normal and S2 normal.  Exam reveals no gallop.   No murmur heard. Pulses:      Dorsalis pedis pulses are 2+ on the right side, and 2+ on the left side.  Respiratory: No respiratory distress. She has decreased breath sounds in the right lower field and the left lower field. She has wheezes in the right middle  field, the right lower field, the left middle field and the left lower field. She has no rhonchi. She has no rales.  GI: Soft. Bowel sounds are normal. There is no tenderness.  Musculoskeletal:       Right ankle: She exhibits swelling.       Left ankle: She exhibits swelling.  Lymphadenopathy:    She has no cervical adenopathy.  Neurological: She is alert and oriented to person, place, and time. No cranial nerve deficit.  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  Skin: Skin is warm. No rash noted. Nails show no clubbing.  Psychiatric: She has a normal mood and affect.      Data Reviewed: Basic Metabolic Panel:  Recent Labs Lab 08/19/15 1129 08/19/15 1729 08/20/15 0504 08/21/15 0446  NA 125*  --  132* 133*  K 2.7* 2.9* 3.2* 3.5  CL 70*  --  81* 85*  CO2 47*  --  43* 43*  GLUCOSE 221*  --  153* 140*  BUN 9  --  11 16  CREATININE 0.55  --  0.53 0.53  CALCIUM 8.7*  --  8.4* 8.3*  MG 1.6* 2.0 1.9  --    CBC:  Recent Labs Lab 08/19/15 1129 08/20/15 0504 08/21/15 0446  WBC 14.8* 10.6 7.6  NEUTROABS 12.9* 9.9* 6.5  HGB 13.8 12.6 12.4  HCT 41.2 37.0 37.2  MCV 93.1 92.8 93.8  PLT 236 210 210   Cardiac Enzymes:  Recent Labs Lab 08/19/15 1129  TROPONINI 0.04*  BNP (last 3 results)  Recent Labs  05/22/15 0821  BNP 359.0*     CBG:  Recent Labs Lab 08/19/15 1528  GLUCAP 139*    Recent Results (from the past 240 hour(s))  Culture, blood (routine x 2)     Status: None (Preliminary result)   Collection Time: 08/19/15 11:29 AM  Result Value Ref Range Status   Specimen Description BLOOD LEFT ANTECUBITAL  Final   Special Requests BOTTLES DRAWN AEROBIC AND ANAEROBIC  6CC  Final   Culture NO GROWTH 3 DAYS  Final   Report Status PENDING  Incomplete  Culture, blood (routine x 2)     Status: None (Preliminary result)   Collection Time: 08/19/15 11:29 AM  Result Value Ref Range Status   Specimen Description BLOOD LEFT HAND  Final   Special Requests  BOTTLES DRAWN AEROBIC AND ANAEROBIC  5CC  Final   Culture NO GROWTH 3 DAYS  Final   Report Status PENDING  Incomplete  MRSA PCR Screening     Status: Abnormal   Collection Time: 08/19/15  3:32 PM  Result Value Ref Range Status   MRSA by PCR POSITIVE (A) NEGATIVE Final    Comment:        The GeneXpert MRSA Assay (FDA approved for NASAL specimens only), is one component of a comprehensive MRSA colonization surveillance program. It is not intended to diagnose MRSA infection nor to guide or monitor treatment for MRSA infections. RESULT CALLED TO, READ BACK BY AND VERIFIED WITH: ADAM SCARBOROUGH 08/19/15 @ 1652  MLK   Urine culture     Status: None   Collection Time: 08/19/15  6:24 PM  Result Value Ref Range Status   Specimen Description URINE, RANDOM  Final   Special Requests NONE  Final   Culture NO GROWTH Performed at [facility]   Final   Report Status 08/22/2015 FINAL  Final     Studies: Dg Chest Port 1 View  08/21/2015  CLINICAL DATA:  Acute respiratory failure EXAM: PORTABLE CHEST 1 VIEW COMPARISON:  Chest radiograph from one day prior. FINDINGS: Stable cardiomediastinal silhouette with mild cardiomegaly. No pneumothorax. No pleural effusion. Emphysema. No pulmonary edema. Stable mild platelike atelectasis at the right lung base. No acute consolidative airspace disease. IMPRESSION: 1. Stable mild platelike atelectasis at the right lung base. 2. Emphysema . 3. Stable mild cardiomegaly without pulmonary edema. Electronically Signed   By: Delbert Phenix M.D.   On: 08/21/2015 08:07    Scheduled Meds: . baclofen  10 mg Oral BID  . budesonide (PULMICORT) nebulizer solution  0.25 mg Nebulization BID  . enoxaparin (LOVENOX) injection  40 mg Subcutaneous Q24H  . furosemide  20 mg Oral Daily  . levofloxacin  750 mg Oral Daily  . methylPREDNISolone (SOLU-MEDROL) injection  60 mg Intravenous Q8H  . metoprolol tartrate  25 mg Oral BID  . mirtazapine  15 mg Oral QHS  . mupirocin  ointment   Nasal BID  . nicotine  14 mg Transdermal Daily  . phenytoin  300 mg Oral QHS  . polyethylene glycol  17 g Oral Daily  . senna-docusate  1 tablet Oral BID  . simvastatin  20 mg Oral QHS    Assessment/Plan:  1. Acute on chronic respiratory failure with hypercapnia. Patient required BiPAP on presentation. Now back to her baseline oxygen 2 L nasal cannula. 2. COPD exacerbation. Switch prednisone back IV Solu-Medrol since the patient is still wheezing quite a bit. Continue budesonide nebulizers. Continue Levaquin. 3. Tobacco  abuse. Patient must stop smoking.  4. Seizure disorder on Dilantin 5. Anxiety depression. I asked the pharmacist to reconcile the medications yesterday. 6. Psychogenic polydipsia 7. History of neuropathy 8. Weakness. Family picked up her walker from the pace program and her oxygen. 9. Lower extremity edema. Restart the patient's oral Lasix  Code Status:     Code Status Orders        Start     Ordered   08/19/15 1318  Do not attempt resuscitation (DNR)   Continuous    Question Answer Comment  In the event of cardiac or respiratory ARREST Do not call a "code blue"   In the event of cardiac or respiratory ARREST Do not perform Intubation, CPR, defibrillation or ACLS   In the event of cardiac or respiratory ARREST Use medication by any route, position, wound care, and other measures to relive pain and suffering. May use oxygen, suction and manual treatment of airway obstruction as needed for comfort.      08/19/15 1317    Code Status History    Date Active Date Inactive Code Status Order ID Comments User Context   08/19/2015  1:04 PM 08/19/2015  1:17 PM Full Code [id number]  Eugenie Norrieana G Blakeney, NP ED   05/22/2015  2:47 PM 06/07/2015  5:45 PM DNR [id number]  Stephanie AcreVishal Mungal, MD Inpatient   05/22/2015 11:15 AM 05/22/2015  2:47 PM Full Code [id number]  Wyatt Hasteavid K Hower, MD ED    Advance Directive Documentation        Most Recent Value   Type of Advance Directive  Out of  facility DNR (pink MOST or yellow form)   Pre-existing out of facility DNR order (yellow form or pink MOST form)     "MOST" Form in Place?       Family Communication: Spoke with the daughter on the phone Disposition Plan: Potentially home In the next day or so depending on clinical course.  Antibiotics:  Levaquin  Time spent: 24 minutes  Alford HighlandWIETING, Dametrius Sanjuan  [employer]

## 2015-08-23 MED ORDER — LACTULOSE 10 GM/15ML PO SOLN
30.0000 g | Freq: Every day | ORAL | Status: DC | PRN
  Administered 2015-08-23: 16:00:00 30 g via ORAL
  Filled 2015-08-23: qty 60

## 2015-08-23 MED ORDER — IPRATROPIUM-ALBUTEROL 0.5-2.5 (3) MG/3ML IN SOLN: 3.0000 mL | Freq: Three times a day (TID) | RESPIRATORY_TRACT | Status: DC

## 2015-08-23 MED ORDER — IPRATROPIUM-ALBUTEROL 0.5-2.5 (3) MG/3ML IN SOLN
3.0000 mL | Freq: Four times a day (QID) | RESPIRATORY_TRACT | Status: DC
  Administered 2015-08-23 (×2): 3 mL via RESPIRATORY_TRACT
  Filled 2015-08-23 (×2): qty 3

## 2015-08-23 NOTE — [place] FL2 (Signed)
[place] MEDICAID FL2 LEVEL OF CARE SCREENING TOOL     IDENTIFICATION  Patient Name: Candace GripDonna M Igarashi Birthdate: June 02, 1940 Sex: female Admission Date (Current Location): 08/19/2015  [facility] [place]   Facility and Address:  [facility], [address], [place], [place] [address]      Provider Number: [id number]  Attending Physician Name and Address:  Alford Highlandichard Wieting, MD  Relative Name and Phone Number:       Current Level of Care: Hospital Recommended Level of Care: Skilled Nursing Facility Prior Approval Number:    Date Approved/Denied:   PASRR Number:    Discharge Plan: SNF    Current Diagnoses: Patient Active Problem List   Diagnosis Date Noted  . Acute respiratory failure with hypercapnia (HCC) 08/19/2015  . Acute delirium 05/26/2015  . 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 Seizures (HCC) 05/26/2015  . Psychosis 05/26/2015  . Acute on chronic respiratory failure with hypoxia (HCC) 05/22/2015    Orientation RESPIRATION BLADDER Height & Weight     Self, Time, Situation, Place  O2 (3L) Continent Weight: 159 lb 6 oz (72.292 kg) Height:  5\' 4"  (162.6 cm)  BEHAVIORAL SYMPTOMS/MOOD NEUROLOGICAL BOWEL NUTRITION STATUS      Continent Diet (Regular Diet)  AMBULATORY STATUS COMMUNICATION OF NEEDS Skin   Limited Assist Verbally Normal                       Personal Care Assistance Level of Assistance  Bathing, Feeding, Dressing Bathing Assistance: Limited assistance Feeding assistance: Independent Dressing Assistance: Limited assistance     Functional Limitations Info  Sight, Hearing, Speech Sight Info: Adequate Hearing Info: Adequate Speech Info: Adequate    SPECIAL CARE FACTORS FREQUENCY  PT (By licensed PT)     PT Frequency: 5              Contractures Contractures Info: Not present    Additional Factors Info  Code Status, Allergies, Psychotropic Code Status Info: DNR Allergies Info: Aspirin,  Codeine, Contrast Media, Penicillins, Sulfa Antibiotics Psychotropic Info: Medication         Current Medications (08/23/2015):  This is the current hospital active medication list Current Facility-Administered Medications  Medication Dose Route Frequency Provider Last Rate Last Dose  . 0.9 %  sodium chloride infusion  250 mL Intravenous PRN Eugenie Norrieana G Blakeney, NP      . acetaminophen (TYLENOL) tablet 650 mg  650 mg Oral Q4H PRN Stephanie AcreVishal Mungal, MD   650 mg at 08/22/15 0625  . ALPRAZolam Prudy Feeler(XANAX) tablet 0.25 mg  0.25 mg Oral TID PRN Katharina Caperima Vaickute, MD   0.25 mg at 08/22/15 2110  . baclofen (LIORESAL) tablet 10 mg  10 mg Oral BID Katharina Caperima Vaickute, MD   10 mg at 08/23/15 0807  . budesonide (PULMICORT) nebulizer solution 0.25 mg  0.25 mg Nebulization BID Alford Highlandichard Wieting, MD   0.25 mg at 08/23/15 0806  . enoxaparin (LOVENOX) injection 40 mg  40 mg Subcutaneous Q24H Eugenie Norrieana G Blakeney, NP   40 mg at 08/22/15 1554  . furosemide (LASIX) tablet 20 mg  20 mg Oral Daily Alford Highlandichard Wieting, MD   20 mg at 08/23/15 0807  . gabapentin (NEURONTIN) capsule 100 mg  100 mg Oral QHS Alford Highlandichard Wieting, MD   100 mg at 08/22/15 2100  . hydrALAZINE (APRESOLINE) injection 10 mg  10 mg Intravenous Q4H PRN Eugenie Norrieana G Blakeney, NP   10 mg at 08/19/15 1733  . ipratropium-albuterol (DUONEB) 0.5-2.5 (3) MG/3ML  nebulizer solution 3 mL  3 mL Nebulization Q6H Alford Highland, MD      . lactulose (CHRONULAC) 10 GM/15ML solution 30 g  30 g Oral Daily PRN Alford Highland, MD      . methylPREDNISolone sodium succinate (SOLU-MEDROL) 125 mg/2 mL injection 60 mg  60 mg Intravenous Q8H Alford Highland, MD   60 mg at 08/23/15 0807  . metoprolol tartrate (LOPRESSOR) tablet 25 mg  25 mg Oral BID Eugenie Norrie, NP   25 mg at 08/23/15 0981  . mirtazapine (REMERON) tablet 15 mg  15 mg Oral QHS Alford Highland, MD   15 mg at 08/22/15 2100  . mupirocin ointment (BACTROBAN) 2 %   Nasal BID Vishal Mungal, MD      . nicotine (NICODERM CQ - dosed in mg/24 hours)  patch 14 mg  14 mg Transdermal Daily Vishal Mungal, MD   14 mg at 08/23/15 0807  . OLANZapine zydis (ZYPREXA) disintegrating tablet 5 mg  5 mg Oral QHS Alford Highland, MD   5 mg at 08/22/15 2059  . phenytoin (DILANTIN) ER capsule 300 mg  300 mg Oral QHS Vishal Mungal, MD   300 mg at 08/22/15 2057  . polyethylene glycol (MIRALAX / GLYCOLAX) packet 17 g  17 g Oral Daily Alford Highland, MD   17 g at 08/23/15 0807  . senna-docusate (Senokot-S) tablet 1 tablet  1 tablet Oral BID Alford Highland, MD   1 tablet at 08/23/15 0807  . sertraline (ZOLOFT) tablet 100 mg  100 mg Oral Daily Alford Highland, MD   100 mg at 08/23/15 1914  . simvastatin (ZOCOR) tablet 20 mg  20 mg Oral QHS Eugenie Norrie, NP   20 mg at 08/22/15 2059     Discharge Medications: Please see discharge summary for a list of discharge medications.  Relevant Imaging Results:  Relevant Lab Results:   Additional Information SSN:  [id number]  Dede Query, LCSW

## 2015-08-23 NOTE — Progress Notes (Addendum)
[name], female   DOB: 06-Aug-1940, 75 y.o.   MRN: [id number] [employer] PROGRESS NOTE  Candace Woods WUJ:[id number] DOB: 1941-02-11 DOA: 08/19/2015 PCP: Bobbye Morton, MD  HPI/Subjective: Patient states her breathing is better that she always wheezes. Patient is a current smoker. She states she is not feeling great.  Objective: Filed Vitals:   08/23/15 0423 08/23/15 1228  BP: 128/68 138/66  Pulse: 95 79  Temp: 99.3 F (37.4 C) 98.3 F (36.8 C)  Resp: 18 20    Filed Weights   08/21/15 0611 08/22/15 0457 08/23/15 0500  Weight: 71.725 kg (158 lb 2 oz) 72.349 kg (159 lb 8 oz) 72.292 kg (159 lb 6 oz)    ROS: Review of Systems  Constitutional: Negative for fever and chills.  Eyes: Negative for blurred vision.  Respiratory: Positive for shortness of breath and wheezing. Negative for cough.   Cardiovascular: Negative for chest pain.  Gastrointestinal: Positive for constipation. Negative for nausea, vomiting, abdominal pain and diarrhea.  Genitourinary: Negative for dysuria.  Musculoskeletal: Negative for joint pain.  Neurological: Negative for dizziness and headaches.   Exam: Physical Exam  Constitutional: She is oriented to person, place, and time.  HENT:  Nose: No mucosal edema.  Mouth/Throat: No oropharyngeal exudate or posterior oropharyngeal edema.  Eyes: Conjunctivae, EOM and lids are normal. Pupils are equal, round, and reactive to light.  Neck: No JVD present. Carotid bruit is not present. No edema present. No thyroid mass and no thyromegaly present.  Cardiovascular: S1 normal and S2 normal.  Exam reveals no gallop.   No murmur heard. Pulses:      Dorsalis pedis pulses are 2+ on the right side, and 2+ on the left side.  Respiratory: No respiratory distress. She has decreased breath sounds in the right lower field and the left lower field. She has wheezes in the right middle field, the right lower field, the left middle field and the left lower  field. She has no rhonchi. She has no rales.  GI: Soft. Bowel sounds are normal. There is no tenderness.  Musculoskeletal:       Right ankle: She exhibits swelling.       Left ankle: She exhibits swelling.  Lymphadenopathy:    She has no cervical adenopathy.  Neurological: She is alert and oriented to person, place, and time. No cranial nerve deficit.  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  Skin: Skin is warm. No rash noted. Nails show no clubbing.  Psychiatric: She has a normal mood and affect.      Data Reviewed: Basic Metabolic Panel:  Recent Labs Lab 08/19/15 1129 08/19/15 1729 08/20/15 0504 08/21/15 0446  NA 125*  --  132* 133*  K 2.7* 2.9* 3.2* 3.5  CL 70*  --  81* 85*  CO2 47*  --  43* 43*  GLUCOSE 221*  --  153* 140*  BUN 9  --  11 16  CREATININE 0.55  --  0.53 0.53  CALCIUM 8.7*  --  8.4* 8.3*  MG 1.6* 2.0 1.9  --    CBC:  Recent Labs Lab 08/19/15 1129 08/20/15 0504 08/21/15 0446  WBC 14.8* 10.6 7.6  NEUTROABS 12.9* 9.9* 6.5  HGB 13.8 12.6 12.4  HCT 41.2 37.0 37.2  MCV 93.1 92.8 93.8  PLT 236 210 210   Cardiac Enzymes:  Recent Labs Lab 08/19/15 1129  TROPONINI 0.04*   BNP (last 3 results)  Recent Labs  05/22/15 9562  BNP 359.0*     CBG:  Recent Labs Lab 08/19/15 1528  GLUCAP 139*    Recent Results (from the past 240 hour(s))  Culture, blood (routine x 2)     Status: None (Preliminary result)   Collection Time: 08/19/15 11:29 AM  Result Value Ref Range Status   Specimen Description BLOOD LEFT ANTECUBITAL  Final   Special Requests BOTTLES DRAWN AEROBIC AND ANAEROBIC  6CC  Final   Culture NO GROWTH 3 DAYS  Final   Report Status PENDING  Incomplete  Culture, blood (routine x 2)     Status: None (Preliminary result)   Collection Time: 08/19/15 11:29 AM  Result Value Ref Range Status   Specimen Description BLOOD LEFT HAND  Final   Special Requests BOTTLES DRAWN AEROBIC AND ANAEROBIC  5CC  Final   Culture NO GROWTH 3  DAYS  Final   Report Status PENDING  Incomplete  MRSA PCR Screening     Status: Abnormal   Collection Time: 08/19/15  3:32 PM  Result Value Ref Range Status   MRSA by PCR POSITIVE (A) NEGATIVE Final    Comment:        The GeneXpert MRSA Assay (FDA approved for NASAL specimens only), is one component of a comprehensive MRSA colonization surveillance program. It is not intended to diagnose MRSA infection nor to guide or monitor treatment for MRSA infections. RESULT CALLED TO, READ BACK BY AND VERIFIED WITH: ADAM SCARBOROUGH 08/19/15 @ 1652  MLK   Urine culture     Status: None   Collection Time: 08/19/15  6:24 PM  Result Value Ref Range Status   Specimen Description URINE, RANDOM  Final   Special Requests NONE  Final   Culture NO GROWTH Performed at [facility]   Final   Report Status 08/22/2015 FINAL  Final     Scheduled Meds: . baclofen  10 mg Oral BID  . budesonide (PULMICORT) nebulizer solution  0.25 mg Nebulization BID  . enoxaparin (LOVENOX) injection  40 mg Subcutaneous Q24H  . furosemide  20 mg Oral Daily  . gabapentin  100 mg Oral QHS  . ipratropium-albuterol  3 mL Nebulization Q6H  . methylPREDNISolone (SOLU-MEDROL) injection  60 mg Intravenous Q8H  . metoprolol tartrate  25 mg Oral BID  . mirtazapine  15 mg Oral QHS  . mupirocin ointment   Nasal BID  . nicotine  14 mg Transdermal Daily  . OLANZapine zydis  5 mg Oral QHS  . phenytoin  300 mg Oral QHS  . polyethylene glycol  17 g Oral Daily  . senna-docusate  1 tablet Oral BID  . sertraline  100 mg Oral Daily  . simvastatin  20 mg Oral QHS    Assessment/Plan:  1. Acute on chronic respiratory failure with hypercapnia. Patient required BiPAP on presentation. Now back to her baseline oxygen 2 L nasal cannula. 2. COPD exacerbation. Continue IV Solu-Medrol since the patient is still wheezing quite a bit. Continue budesonide nebulizers, DuoNeb nebulizer. Continue Levaquin. Very concerned about readmission  since the patient is a smoker. 3. Tobacco abuse. Patient must stop smoking.  4. Seizure disorder on Dilantin 5. Anxiety depression. Psychiatric medications reordered yesterday 6. Psychogenic polydipsia 7. History of neuropathy 8. Weakness. Family picked up her walker from the pace program and her oxygen. 9. Lower extremity edema. Restarted oral Lasix 10. Constipation- lactulose ordered  Code Status:     Code Status Orders        Start  Ordered   08/19/15 1318  Do not attempt resuscitation (DNR)   Continuous    Question Answer Comment  In the event of cardiac or respiratory ARREST Do not call a "code blue"   In the event of cardiac or respiratory ARREST Do not perform Intubation, CPR, defibrillation or ACLS   In the event of cardiac or respiratory ARREST Use medication by any route, position, wound care, and other measures to relive pain and suffering. May use oxygen, suction and manual treatment of airway obstruction as needed for comfort.      08/19/15 1317    Code Status History    Date Active Date Inactive Code Status Order ID Comments User Context   08/19/2015  1:04 PM 08/19/2015  1:17 PM Full Code [id number]  Eugenie Norrieana G Blakeney, NP ED   05/22/2015  2:47 PM 06/07/2015  5:45 PM DNR [id number]  Stephanie AcreVishal Mungal, MD Inpatient   05/22/2015 11:15 AM 05/22/2015  2:47 PM Full Code [id number]  Wyatt Hasteavid K Hower, MD ED    Advance Directive Documentation        Most Recent Value   Type of Advance Directive  Out of facility DNR (pink MOST or yellow form)   Pre-existing out of facility DNR order (yellow form or pink MOST form)     "MOST" Form in Place?       Family Communication: Spoke with the daughter on the phone Yesterday. When patient is ready for discharge can call son-in-law and the had [phone]. Disposition Plan: Potentially home In the next day or so depending on clinical course.  Antibiotics:  Levaquin  Time spent: 22 minutes  Alford HighlandWIETING, Kana Reimann  [employer]

## 2015-08-23 NOTE — Progress Notes (Signed)
Physical Therapy Treatment Patient Details Name: Candace GripDonna M Gorter MRN: [id number] DOB: [birth year] Today's Date: 08/23/2015    History of Present Illness This is a 75 yo female with PMH of COPD, HTN, seizures, peripheral neuropathy, right torticollis, anxiety, depression, psychogenic polydipsia, MRSA pneumonia (2014), hyperlipidemia, and asthma. [facility] ER [facility]7 with increased lethargy and somnolence since this am and a productive cough onset 2 days ago. Patient admitted with  acute respiratory failure.     PT Comments    Pt willing to participate but indicated that she did not want to do a lot.  Pt on 2 liters O2 on arrival with O2 in mid/low 80s, did improve somewhat with focused breathing, but kept her on 3 liters O2 t/o most of session and though she does not have excessive fatigue her O2 sats remains generally in the mid/high 80s. Pt had some unsteadiness with ambulation, but no significant safety issues and she seemed very aware do have someone with her while doing the steps, etc.    Follow Up Recommendations  Home health PT;Supervision/Assistance - 24 hour     Equipment Recommendations  None recommended by PT    Recommendations for Other Services       Precautions / Restrictions Precautions Precautions: Fall Restrictions Weight Bearing Restrictions: No    Mobility  Bed Mobility Overal bed mobility: Modified Independent             General bed mobility comments: Pt does not require excessive assist getting to sitting EOB  Transfers Overall transfer level: Needs assistance Equipment used: Rolling walker (2 wheeled) Transfers: Sit to/from Stand Sit to Stand: Min guard         General transfer comment: Pt is able to rise to standing needing cues for hand placement and set up.  No overt safety issues.   Ambulation/Gait Ambulation/Gait assistance: Min assist;Min guard Ambulation Distance (Feet): 35 Feet Assistive device: Rolling walker (2 wheeled)        General Gait Details: Pt c/o the walker t/o the effort (she uses rollator at home, more mobile).  She needs reminders/cuing to keep walker close enough to center of gravity and motivation to do more than she was initially indicating she wanted to.  Pt with some unsteadiness, but no LOBs or other signficant safety issues apart from walker manipulation and some fatigue with the effort.    Stairs Stairs: Yes Stairs assistance: Min assist   Number of Stairs: 1 General stair comments: Pt able to go up/down 1 step X 2 with assist with the walker and a lot of cuing. Pt reports that she never does the steps alone and feels that she did the step well enough today to be able to go home safely with her normal level of assist.  Wheelchair Mobility    Modified Rankin (Stroke Patients Only)       Balance                                    Cognition Arousal/Alertness: Awake/alert Behavior During Therapy: Restless Overall Cognitive Status: Within Functional Limits for tasks assessed                      Exercises General Exercises - Lower Extremity Quad Sets: Both;Strengthening (12 reps) Straight Leg Raises: 10 reps;Strengthening;Both    General Comments        Pertinent Vitals/Pain Pain Assessment:  (  does not report pain)    Home Living                      Prior Function            PT Goals (current goals can now be found in the care plan section) Progress towards PT goals: Progressing toward goals    Frequency  Min 2X/week    PT Plan Current plan remains appropriate    Co-evaluation             End of Session Equipment Utilized During Treatment: Gait belt Activity Tolerance: Patient limited by fatigue Patient left: in chair;with call bell/phone within reach;with chair alarm set     Time: 1610-9604 PT Time Calculation (min) (ACUTE ONLY): 43 min  Charges:  $Gait Training: 23-37 mins $Therapeutic Exercise: 8-22 mins                     G Codes:      Malachi Pro, DPT  08/23/2015, 11:09 AM

## 2015-08-24 LAB — CBC
HEMATOCRIT: 39.1 % (ref 35.0–47.0)
Hemoglobin: 13.1 g/dL (ref 12.0–16.0)
MCH: 31.1 pg (ref 26.0–34.0)
MCHC: 33.5 g/dL (ref 32.0–36.0)
MCV: 92.9 fL (ref 80.0–100.0)
Platelets: 190 10*3/uL (ref 150–440)
RBC: 4.21 MIL/uL (ref 3.80–5.20)
RDW: 19 % — AB (ref 11.5–14.5)
WBC: 5.7 10*3/uL (ref 3.6–11.0)

## 2015-08-24 LAB — EXPECTORATED SPUTUM ASSESSMENT W REFEX TO RESP CULTURE

## 2015-08-24 LAB — OSMOLALITY, URINE: OSMOLALITY UR: 233 mosm/kg — AB (ref 300–900)

## 2015-08-24 LAB — CULTURE, BLOOD (ROUTINE X 2)
CULTURE: NO GROWTH
Culture: NO GROWTH

## 2015-08-24 LAB — BASIC METABOLIC PANEL
Anion gap: 5 (ref 5–15)
BUN: 15 mg/dL (ref 6–20)
CALCIUM: 8.6 mg/dL — AB (ref 8.9–10.3)
CHLORIDE: 86 mmol/L — AB (ref 101–111)
CO2: 42 mmol/L — AB (ref 22–32)
CREATININE: 0.62 mg/dL (ref 0.44–1.00)
GFR calc Af Amer: >60 mL/min (ref >60)
GFR calc non Af Amer: >60 mL/min (ref >60)
Glucose, Bld: 129 mg/dL — ABNORMAL HIGH (ref 65–99)
Potassium: 3.9 mmol/L (ref 3.5–5.1)
Sodium: 133 mmol/L — ABNORMAL LOW (ref 135–145)

## 2015-08-24 LAB — EXPECTORATED SPUTUM ASSESSMENT W GRAM STAIN, RFLX TO RESP C

## 2015-08-24 MED ORDER — IPRATROPIUM-ALBUTEROL 0.5-2.5 (3) MG/3ML IN SOLN
3.0000 mL | RESPIRATORY_TRACT | Status: DC
  Administered 2015-08-24 – 2015-08-26 (×9): 3 mL via RESPIRATORY_TRACT
  Filled 2015-08-24 (×9): qty 3

## 2015-08-24 MED ORDER — SODIUM CHLORIDE 3 % IN NEBU
4.0000 mL | INHALATION_SOLUTION | Freq: Two times a day (BID) | RESPIRATORY_TRACT | Status: DC
  Administered 2015-08-24: 08:00:00 4 mL via RESPIRATORY_TRACT
  Administered 2015-08-24: 20:00:00 15 mL via RESPIRATORY_TRACT
  Administered 2015-08-25 (×2): 4 mL via RESPIRATORY_TRACT
  Administered 2015-08-26: 08:00:00 15 mL via RESPIRATORY_TRACT
  Filled 2015-08-24 (×7): qty 4

## 2015-08-24 MED ORDER — GUAIFENESIN ER 600 MG PO TB12
600.0000 mg | ORAL_TABLET | Freq: Two times a day (BID) | ORAL | Status: DC
  Administered 2015-08-24 – 2015-08-26 (×5): 600 mg via ORAL
  Filled 2015-08-24 (×5): qty 1

## 2015-08-24 NOTE — Progress Notes (Signed)
PT Cancellation Note  Patient Details Name: Candace Woods MRN: [id number] DOB: 17-Dec-1940   Cancelled Treatment:    Reason Eval/Treat Not Completed: Patient declined supine TherEx, ambulation, and transfer to chair despite education on benefits of activity. Pt states that she sat up for an extended period of time yesterday and that she would just like to rest before her lunch arrives. Will re-attempt PT at a later date/time.     Tyren Dugar, SPT 08/24/2015, 11:07 AM

## 2015-08-24 NOTE — Care Management Important Message (Signed)
Important Message  Patient Details  Name: Loraine GripDonna M Lafalce MRN: [id number] Date of Birth: 18-Mar-1940   Medicare Important Message Given:  Yes    Gwenette GreetBrenda S Azad Calame, RN 08/24/2015, 11:46 AM

## 2015-08-24 NOTE — Progress Notes (Signed)
[name], female   DOB: 05/04/1940, 75 y.o.   MRN: [id number] Candace Woods PROGRESS NOTE  Candace Woods:[id number] DOB: 07/07/1940 DOA: 08/19/2015 PCP: Candace Morton, MD  HPI/Subjective: Patient states her breathing is better that she always wheezes. Patient is a current smoker. She states she has been feeling better, however, remains short of breath intermittently, congested in the chest, unable to lift up phlegm, likely due to thickness of the phlegm .  Objective: Filed Vitals:   08/24/15 0454 08/24/15 0942  BP: 131/67 122/56  Pulse: 84 99  Temp: 98.6 F (37 C)   Resp: 16     Filed Weights   08/22/15 0457 08/23/15 0500 08/24/15 0454  Weight: 72.349 kg (159 lb 8 oz) 72.292 kg (159 lb 6 oz) 73.12 kg (161 lb 3.2 oz)    ROS: Review of Systems  Constitutional: Negative for fever and chills.  Eyes: Negative for blurred vision.  Respiratory: Positive for shortness of breath and wheezing. Negative for cough.   Cardiovascular: Negative for chest pain.  Gastrointestinal: Positive for constipation. Negative for nausea, vomiting, abdominal pain and diarrhea.  Genitourinary: Negative for dysuria.  Musculoskeletal: Negative for joint pain.  Neurological: Negative for dizziness and headaches.   Exam: Physical Exam  Constitutional: She is oriented to person, place, and time.  HENT:  Nose: No mucosal edema.  Mouth/Throat: No oropharyngeal exudate or posterior oropharyngeal edema.  Eyes: Conjunctivae, EOM and lids are normal. Pupils are equal, round, and reactive to light.  Neck: No JVD present. Carotid bruit is not present. No edema present. No thyroid mass and no thyromegaly present.  Cardiovascular: S1 normal and S2 normal.  Exam reveals no gallop.   No murmur heard. Pulses:      Dorsalis pedis pulses are 2+ on the right side, and 2+ on the left side.  Respiratory: No respiratory distress. She has decreased breath sounds in the right lower field and the left  lower field. She has wheezes in the right middle field, the right lower field, the left middle field and the left lower field. She has no rhonchi. She has no rales. She exhibits no tenderness.  GI: Soft. Bowel sounds are normal. There is no tenderness.  Musculoskeletal:       Right ankle: She exhibits swelling.       Left ankle: She exhibits swelling.  Lymphadenopathy:    She has no cervical adenopathy.  Neurological: She is alert and oriented to person, place, and time. No cranial nerve deficit.  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  Skin: Skin is warm. No rash noted. Nails show no clubbing.  Psychiatric: She has a normal mood and affect.  Diminished breath sounds bilaterally and lungs, poor air entrance bilaterally, especially on the right side posteriorly, diffuse scattered wheezing.     Data Reviewed: Basic Metabolic Panel:  Recent Labs Lab 08/19/15 1129 08/19/15 1729 08/20/15 0504 08/21/15 0446 08/24/15 0544  NA 125*  --  132* 133* 133*  K 2.7* 2.9* 3.2* 3.5 3.9  CL 70*  --  81* 85* 86*  CO2 47*  --  43* 43* 42*  GLUCOSE 221*  --  153* 140* 129*  BUN 9  --  11 16 15   CREATININE 0.55  --  0.53 0.53 0.62  CALCIUM 8.7*  --  8.4* 8.3* 8.6*  MG 1.6* 2.0 1.9  --   --    CBC:  Recent Labs Lab 08/19/15 1129 08/20/15 0504 08/21/15 0446  08/24/15 0544  WBC 14.8* 10.6 7.6 5.7  NEUTROABS 12.9* 9.9* 6.5  --   HGB 13.8 12.6 12.4 13.1  HCT 41.2 37.0 37.2 39.1  MCV 93.1 92.8 93.8 92.9  PLT 236 210 210 190   Cardiac Enzymes:  Recent Labs Lab 08/19/15 1129  TROPONINI 0.04*   BNP (last 3 results)  Recent Labs  05/22/15 0821  BNP 359.0*     CBG:  Recent Labs Lab 08/19/15 1528  GLUCAP 139*    Recent Results (from the past 240 hour(s))  Culture, blood (routine x 2)     Status: None (Preliminary result)   Collection Time: 08/19/15 11:29 AM  Result Value Ref Range Status   Specimen Description BLOOD LEFT ANTECUBITAL  Final   Special Requests  BOTTLES DRAWN AEROBIC AND ANAEROBIC  6CC  Final   Culture NO GROWTH 4 DAYS  Final   Report Status PENDING  Incomplete  Culture, blood (routine x 2)     Status: None (Preliminary result)   Collection Time: 08/19/15 11:29 AM  Result Value Ref Range Status   Specimen Description BLOOD LEFT HAND  Final   Special Requests BOTTLES DRAWN AEROBIC AND ANAEROBIC  5CC  Final   Culture NO GROWTH 4 DAYS  Final   Report Status PENDING  Incomplete  MRSA PCR Screening     Status: Abnormal   Collection Time: 08/19/15  3:32 PM  Result Value Ref Range Status   MRSA by PCR POSITIVE (A) NEGATIVE Final    Comment:        The GeneXpert MRSA Assay (FDA approved for NASAL specimens only), is one component of a comprehensive MRSA colonization surveillance program. It is not intended to diagnose MRSA infection nor to guide or monitor treatment for MRSA infections. RESULT CALLED TO, READ BACK BY AND VERIFIED WITH: Candace Woods 08/19/15 @ 1652  MLK   Urine culture     Status: None   Collection Time: 08/19/15  6:24 PM  Result Value Ref Range Status   Specimen Description URINE, RANDOM  Final   Special Requests NONE  Final   Culture NO GROWTH Performed at [facility]   Final   Report Status 08/22/2015 FINAL  Final     Scheduled Meds: . baclofen  10 mg Oral BID  . budesonide (PULMICORT) nebulizer solution  0.25 mg Nebulization BID  . enoxaparin (LOVENOX) injection  40 mg Subcutaneous Q24H  . furosemide  20 mg Oral Daily  . gabapentin  100 mg Oral QHS  . guaiFENesin  600 mg Oral BID  . ipratropium-albuterol  3 mL Nebulization Q4H  . methylPREDNISolone (SOLU-MEDROL) injection  60 mg Intravenous Q8H  . metoprolol tartrate  25 mg Oral BID  . mirtazapine  15 mg Oral QHS  . mupirocin ointment   Nasal BID  . nicotine  14 mg Transdermal Daily  . OLANZapine zydis  5 mg Oral QHS  . phenytoin  300 mg Oral QHS  . sertraline  100 mg Oral Daily  . simvastatin  20 mg Oral QHS  . sodium chloride  HYPERTONIC  4 mL Nebulization BID    Assessment/Plan:  1. Acute on chronic respiratory failure with Hypoxia and hypercapnia. Patient required BiPAP on presentation. Now back to her baseline oxygen 2 L nasal cannula. She has improved clinically, however, not at baseline  2. COPD exacerbation. Continue IV Solu-Medrol since the patient is still wheezing. Continue budesonide nebulizers, DuoNeb nebulizer, change frequency to every 4 hours, follow clinically. Continue Levaquin. Very  concerned about readmission since the patient is a smoker. 3. Tobacco abuse. Patient must stop smoking. Discussed this patient for 3 minutes, nicotine replacement therapy to be continued  4. Seizure disorder on Dilantin, get Dilantin level in a.m. 5. Anxiety depression. Psychiatric medications reordered , comfortable 6. 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 History of neuropathy, continue Neurontin, no complaints 8. Weakness. Family picked up her walker from the pace program and her oxygen. Physical therapist saw patient and recommended home health services 9. Lower extremity edema. Restarted oral Lasix, sodium level has improved 10. Constipation- lactulose ordered, last bowel movement was 08/23/2015  Code Status:     Code Status Orders        Start     Ordered   08/19/15 1318  Do not attempt resuscitation (DNR)   Continuous    Question Answer Comment  In the event of cardiac or respiratory ARREST Do not call a "code blue"   In the event of cardiac or respiratory ARREST Do not perform Intubation, CPR, defibrillation or ACLS   In the event of cardiac or respiratory ARREST Use medication by any route, position, wound care, and other measures to relive pain and suffering. May use oxygen, suction and manual treatment of airway obstruction as needed for comfort.      08/19/15 1317    Code Status History    Date  Active Date Inactive Code Status Order ID Comments User Context   08/19/2015  1:04 PM 08/19/2015  1:17 PM Full Code [id number]  Eugenie Norrie, NP ED   05/22/2015  2:47 PM 06/07/2015  5:45 PM DNR [id number]  Stephanie Acre, MD Inpatient   05/22/2015 11:15 AM 05/22/2015  2:47 PM Full Code [id number]  Wyatt Haste, MD ED    Advance Directive Documentation        Most Recent Value   Type of Advance Directive  Out of facility DNR (pink MOST or yellow form)   Pre-existing out of facility DNR order (yellow form or pink MOST form)     "MOST" Form in Place?       Family Communication: Dr. Fonnie Birkenhead spoke with the daughter on the phone today before yesterday. When patient is ready for discharge can call son-in-law and the had [phone]. Disposition Plan: Potentially home In the next day or so depending on clinical course.  Antibiotics:  Levaquin  Time spent: 30 minutes  [employer]

## 2015-08-25 LAB — SODIUM: Sodium: 133 mmol/L — ABNORMAL LOW (ref 135–145)

## 2015-08-25 LAB — BLOOD GAS, VENOUS
ACID-BASE EXCESS: 21 mmol/L — AB (ref 0.0–3.0)
BICARBONATE: 53.4 meq/L — AB (ref 21.0–28.0)
FIO2: 40
Patient temperature: 37
pCO2, Ven: 106 mmHg (ref 44.0–60.0)
pH, Ven: 7.31 — ABNORMAL LOW (ref 7.320–7.430)
pO2, Ven: <31 mmHg — ABNORMAL LOW (ref 31.0–45.0)

## 2015-08-25 LAB — PHENYTOIN LEVEL, TOTAL: Phenytoin Lvl: 5.5 ug/mL — ABNORMAL LOW (ref 10.0–20.0)

## 2015-08-25 MED ORDER — SODIUM CHLORIDE 1 G PO TABS
1.0000 g | ORAL_TABLET | Freq: Two times a day (BID) | ORAL | Status: DC
  Administered 2015-08-25 – 2015-08-26 (×2): 1 g via ORAL
  Filled 2015-08-25 (×2): qty 1

## 2015-08-25 MED ORDER — PHENYTOIN SODIUM EXTENDED 100 MG PO CAPS
300.0000 mg | ORAL_CAPSULE | Freq: Once | ORAL | Status: AC
  Administered 2015-08-25: 300 mg via ORAL
  Filled 2015-08-25: qty 3

## 2015-08-25 MED ORDER — LEVOFLOXACIN 750 MG PO TABS
750.0000 mg | ORAL_TABLET | Freq: Every day | ORAL | Status: DC
  Administered 2015-08-25 – 2015-08-26 (×2): 750 mg via ORAL
  Filled 2015-08-25 (×2): qty 1

## 2015-08-25 MED ORDER — METHYLPREDNISOLONE SODIUM SUCC 40 MG IJ SOLR
40.0000 mg | Freq: Two times a day (BID) | INTRAMUSCULAR | Status: DC
  Administered 2015-08-25 – 2015-08-26 (×2): 40 mg via INTRAVENOUS
  Filled 2015-08-25 (×2): qty 1

## 2015-08-25 NOTE — Progress Notes (Signed)
PT Cancellation Note  Patient Details Name: Candace Woods MRN: [id number] DOB: 04-Apr-1940   Cancelled Treatment:    Reason Eval/Treat Not Completed: Patient declined, no reason specified. Treatment attempted; pt adamantly refuses PT stating she does not want to do any PT today. Pt wishes just to rest. Re attempt at a later time/date as the schedule allows.    Kristeen MissHeidi Elizabeth Bishop, VirginiaPTA 08/25/2015, 10:52 AM

## 2015-08-25 NOTE — Progress Notes (Signed)
Patient has requested not to be awakened for svn if she is sleeping

## 2015-08-25 NOTE — Clinical Social Work Note (Signed)
Clinical Social Work Assessment  Patient Details  Name: Candace CULLOP MRN: [id number] Date of Birth: [birth year]  Date of referral:  08/25/15               Reason for consult:  Other (Comment Required), Facility Placement ([facility] Patient )                Permission sought to share information with:  Facility Art therapist granted to share information::  No  Name::        Agency::     Relationship::     Contact Information:     Housing/Transportation Living arrangements for the past 2 months:  Single Family Home Source of Information:  Patient, Other (Comment Required) ([facility] ) Patient Interpreter Needed:  None Criminal Activity/Legal Involvement Pertinent to Current Situation/Hospitalization:  No - Comment as needed Significant Relationships:  Adult Children Lives with:  Self Do you feel safe going back to the place where you live?  Yes Need for family participation in patient care:  Yes (Comment)  Care giving concerns:  Patient lives alone in [place] and is followed by [facility].    Social Worker assessment / plan:  Holiday representative (CSW) received verbal consult from RN Case Manager that patient may need SNF. PT is recommending home health with 24/7 assistance. CSW discussed case with April [facility] Education officer, museum. Per April patient can go to SNF if she is agreeable. CSW met with patient alone at bedside to discuss D/C plan. Patient was alert and oriented and was sitting up in the chair. CSW introduced self and explained role of CSW department. Patient reported that she lives alone in [place] and has [facility] services. Per patient she has aids come into her home for several hours a day. Patient adamantly refused SNF and reported that she is going home. CSW made RN case manager and [facility] aware of above. CSW will continue to follow and assist as needed.   Employment status:  Retired Forensic scientist:  Other (Comment Required) ([facility] ) PT Recommendations:  Home with  [place], [place] / Referral to community resources:  Other (Comment Required) ([facility] )  Patient/Family's Response to care:  Patient refused SNF and reported that she is going home.   Patient/Family's Understanding of and Emotional Response to Diagnosis, Current Treatment, and Prognosis: Patient was pleasant.   Emotional Assessment Appearance:  Appears stated age Attitude/Demeanor/Rapport:    Affect (typically observed):  Pleasant Orientation:  Oriented to Self, Oriented to Place, Oriented to  Time Alcohol / Substance use:  Not Applicable Psych involvement (Current and /or in the community):  No (Comment)  Discharge Needs  Concerns to be addressed:  Discharge Planning Concerns Readmission within the last 30 days:  No Current discharge risk:  Dependent with Mobility Barriers to Discharge:  Continued Medical Work up   [employer], Bronwen Betters, LCSW 08/25/2015, 4:07 PM

## 2015-08-25 NOTE — Progress Notes (Signed)
Physical Therapy Treatment Patient Details Name: Candace GripDonna M Woods MRN: [id number] DOB: 09/12/1940 Today's Date: 08/25/2015    History of Present Illness This is a 75 yo female with PMH of COPD, HTN, seizures, peripheral neuropathy, right torticollis, anxiety, depression, psychogenic polydipsia, MRSA pneumonia (2014), hyperlipidemia, and asthma. She presented to [facility] ER on 7/7 with increased lethargy and somnolence since this am and a productive cough onset 2 days ago. Patient admitted with  acute respiratory failure.     PT Comments    Pt reluctantly agreeable to PT this afternoon and mildly feisty. Pt denies pain, but does complains of dizziness upon sit, which subsides after seated exercises. Pt participated in both seated and long sit exercises and encouraged to perform exercises throughout the day (suggested movement during commercial breaks on television.) Pt asks several times how long she is required to sit in chair. Pt education/explanation several times on benefits of sitting up versus lying in bed. Pt requires 2 attempts for standing before maintaining stand with very unstable steps from bed to chair. Continue PT to progress strength to improve stand tolerance and functional mobility.   Follow Up Recommendations  Home health PT;Supervision/Assistance - 24 hour     Equipment Recommendations  None recommended by PT    Recommendations for Other Services       Precautions / Restrictions Precautions Precautions: Fall Restrictions Weight Bearing Restrictions: No    Mobility  Bed Mobility Overal bed mobility: Modified Independent             General bed mobility comments: Increased time; mildly effortful  Transfers Overall transfer level: Needs assistance Equipment used: Rolling walker (2 wheeled) Transfers: Sit to/from Stand Sit to Stand: Min assist         General transfer comment: Pt rises initiailly, but B knees buckle causing pt to sit to edg of bed again. Cues  for QS with stand and Min A to maintain stand second attempt  Ambulation/Gait Ambulation/Gait assistance: Min assist Ambulation Distance (Feet): 4 Feet Assistive device: Rolling walker (2 wheeled) Gait Pattern/deviations: Step-to pattern Gait velocity: decreased Gait velocity interpretation: <1.8 ft/sec, indicative of risk for recurrent falls General Gait Details: Unsteady, pt shakes, head turned to the L (torticollis).    Stairs            Wheelchair Mobility    Modified Rankin (Stroke Patients Only)       Balance Overall balance assessment: Needs assistance Sitting-balance support: Bilateral upper extremity supported;Feet supported Sitting balance-Leahy Scale: Fair     Standing balance support: Bilateral upper extremity supported Standing balance-Leahy Scale: Fair (Fair (-))                      Cognition Arousal/Alertness: Awake/alert Behavior During Therapy: Agitated (fiesty) Overall Cognitive Status: Within Functional Limits for tasks assessed                      Exercises General Exercises - Lower Extremity Ankle Circles/Pumps: AROM;Both;10 reps (long sit) Quad Sets: Strengthening;Both;10 reps (long sit) Gluteal Sets: Strengthening;Both;10 reps (long sit) Long Arc Quad: AROM;Both;10 reps;Seated Heel Slides: AROM;Both;10 reps (long sit) Hip ABduction/ADduction: AROM;Both;10 reps;Seated (performed single leg in extension) Hip Flexion/Marching: AROM;Both;20 reps;Seated Toe Raises: AROM;Both;10 reps;Seated Heel Raises: AROM;Both;10 reps;Seated    General Comments        Pertinent Vitals/Pain Pain Assessment: No/denies pain    Home Living  Prior Function            PT Goals (current goals can now be found in the care plan section) Progress towards PT goals: Progressing toward goals    Frequency  Min 2X/week    PT Plan Current plan remains appropriate    Co-evaluation             End of  Session Equipment Utilized During Treatment: Gait belt Activity Tolerance: Patient limited by fatigue;Other (comment) (weakness, shaking) Patient left: in chair;with call bell/phone within reach;with chair alarm set     Time: 1341-1414 PT Time Calculation (min) (ACUTE ONLY): 33 min  Charges:  $Gait Training: 8-22 mins $Therapeutic Exercise: 8-22 mins                    G Codes:      [name], PTA 08/25/2015, 2:30 PM

## 2015-08-25 NOTE — Progress Notes (Signed)
Clinical Social Worker (CSW) contacted April [facility] Child psychotherapistsocial worker and made her aware that patient has been refusing to work with PT the last couple of days. Per April the plan is for patient to return home because she does not do well in SNF. Per April she will call a team meeting for patient and get back to CSW. CSW will continue to follow and assist as needed.   Baker Hughes IncorporatedBailey Zyion Doxtater, LCSW [phone]

## 2015-08-25 NOTE — Progress Notes (Signed)
[name], female   DOB: Feb 05, 1941, 75 y.o.   MRN: [id number] [facility] PROGRESS NOTE  Candace Woods:[id number] DOB: [birth year] DOA: 08/19/2015 PCP: Bobbye Morton, MD  HPI/Subjective: Patient states she does not feel well today, refused physical therapy. Patient is a current smoker. Admits of some sputum production, cream-colored Objective: Filed Vitals:   08/25/15 0522 08/25/15 0822  BP: 119/53 132/60  Pulse: 86 98  Temp: 99.3 F (37.4 C) 98.1 F (36.7 C)  Resp: 18 20    Filed Weights   08/23/15 0500 08/24/15 0454 08/25/15 0522  Weight: 72.292 kg (159 lb 6 oz) 73.12 kg (161 lb 3.2 oz) 70.625 kg (155 lb 11.2 oz)    ROS: Review of Systems  Constitutional: Negative for fever and chills.  Eyes: Negative for blurred vision.  Respiratory: Positive for shortness of breath and wheezing. Negative for cough.   Cardiovascular: Negative for chest pain.  Gastrointestinal: Positive for constipation. Negative for nausea, vomiting, abdominal pain and diarrhea.  Genitourinary: Negative for dysuria.  Musculoskeletal: Negative for joint pain.  Neurological: Negative for dizziness and headaches.   Exam: Physical Exam  Constitutional: She is oriented to person, place, and time.  HENT:  Nose: No mucosal edema.  Mouth/Throat: No oropharyngeal exudate or posterior oropharyngeal edema.  Eyes: Conjunctivae, EOM and lids are normal. Pupils are equal, round, and reactive to light.  Neck: No JVD present. Carotid bruit is not present. No edema present. No thyroid mass and no thyromegaly present.  Cardiovascular: S1 normal and S2 normal.  Exam reveals no gallop.   No murmur heard. Pulses:      Dorsalis pedis pulses are 2+ on the right side, and 2+ on the left side.  Respiratory: No respiratory distress. She has decreased breath sounds in the right lower field and the left lower field. She has wheezes. She has no rhonchi. She has no rales. She exhibits no tenderness.  GI:  Soft. Bowel sounds are normal. There is no tenderness.  Musculoskeletal:       Right ankle: She exhibits swelling.       Left ankle: She exhibits swelling.  Lymphadenopathy:    She has no cervical adenopathy.  Neurological: She is alert and oriented to person, place, and time. No cranial nerve deficit.  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  Skin: Skin is warm. No rash noted. Nails show no clubbing.  Psychiatric: She has a normal mood and affect.  Diminished breath sounds bilaterally and lungs, poor air entrance bilaterally, especially on the right side posteriorly, diffuse scattered wheezing, some better air entrance overall bilaterally.     Data Reviewed: Basic Metabolic Panel:  Recent Labs Lab 08/19/15 1129 08/19/15 1729 08/20/15 0504 08/21/15 0446 08/24/15 0544 08/25/15 0645  NA 125*  --  132* 133* 133* 133*  K 2.7* 2.9* 3.2* 3.5 3.9  --   CL 70*  --  81* 85* 86*  --   CO2 47*  --  43* 43* 42*  --   GLUCOSE 221*  --  153* 140* 129*  --   BUN 9  --  --   CREATININE 0.55  --  0.53 0.53 0.62  --   CALCIUM 8.7*  --  8.4* 8.3* 8.6*  --   MG 1.6* 2.0 1.9  --   --   --    CBC:  Recent Labs Lab 08/19/15 1129 08/20/15 0504 08/21/15 0446 08/24/15 0544  WBC 14.8* 10.6  7.6 5.7  NEUTROABS 12.9* 9.9* 6.5  --   HGB 13.8 12.6 12.4 13.1  HCT 41.2 37.0 37.2 39.1  MCV 93.1 92.8 93.8 92.9  PLT 236 210 210 190   Cardiac Enzymes:  Recent Labs Lab 08/19/15 1129  TROPONINI 0.04*   BNP (last 3 results)  Recent Labs  05/22/15 0821  BNP 359.0*     CBG:  Recent Labs Lab 08/19/15 1528  GLUCAP 139*    Recent Results (from the past 240 hour(s))  Culture, blood (routine x 2)     Status: None   Collection Time: 08/19/15 11:29 AM  Result Value Ref Range Status   Specimen Description BLOOD LEFT ANTECUBITAL  Final   Special Requests BOTTLES DRAWN AEROBIC AND ANAEROBIC  6CC  Final   Culture NO GROWTH 5 DAYS  Final   Report Status 08/24/2015 FINAL   Final  Culture, blood (routine x 2)     Status: None   Collection Time: 08/19/15 11:29 AM  Result Value Ref Range Status   Specimen Description BLOOD LEFT HAND  Final   Special Requests BOTTLES DRAWN AEROBIC AND ANAEROBIC  5CC  Final   Culture NO GROWTH 5 DAYS  Final   Report Status 08/24/2015 FINAL  Final  MRSA PCR Screening     Status: Abnormal   Collection Time: 08/19/15  3:32 PM  Result Value Ref Range Status   MRSA by PCR POSITIVE (A) NEGATIVE Final    Comment:        The GeneXpert MRSA Assay (FDA approved for NASAL specimens only), is one component of a comprehensive MRSA colonization surveillance program. It is not intended to diagnose MRSA infection nor to guide or monitor treatment for MRSA infections. RESULT CALLED TO, READ BACK BY AND VERIFIED WITH: ADAM SCARBOROUGH 08/19/15 @ 1652  MLK   Urine culture     Status: None   Collection Time: 08/19/15  6:24 PM  Result Value Ref Range Status   Specimen Description URINE, RANDOM  Final   Special Requests NONE  Final   Culture NO GROWTH Performed at [facility]   Final   Report Status 08/22/2015 FINAL  Final  Culture, expectorated sputum-assessment     Status: None   Collection Time: 08/24/15 10:57 AM  Result Value Ref Range Status   Specimen Description SPUTUM  Final   Special Requests NONE  Final   Sputum evaluation THIS SPECIMEN IS ACCEPTABLE FOR SPUTUM CULTURE  Final   Report Status 08/24/2015 FINAL  Final  Culture, respiratory (NON-Expectorated)     Status: None (Preliminary result)   Collection Time: 08/24/15 10:57 AM  Result Value Ref Range Status   Specimen Description SPUTUM  Final   Special Requests NONE Reflexed from [id number]  Final   Gram Stain   Final    RARE WBC PRESENT, PREDOMINANTLY PMN RARE SQUAMOUS EPITHELIAL CELLS PRESENT ABUNDANT GRAM POSITIVE RODS FEW GRAM VARIABLE ROD RARE YEAST Performed at [facility]    Culture PENDING  Incomplete   Report Status PENDING  Incomplete   Culture, expectorated sputum-assessment     Status: None   Collection Time: 08/25/15  7:51 AM  Result Value Ref Range Status   Specimen Description SPUTUM  Final   Special Requests NONE  Final   Sputum evaluation THIS SPECIMEN IS ACCEPTABLE FOR SPUTUM CULTURE  Final   Report Status 08/25/2015 FINAL  Final     Scheduled Meds: . baclofen  10 mg Oral BID  . budesonide (PULMICORT) nebulizer solution  0.25 mg Nebulization BID  . enoxaparin (LOVENOX) injection  40 mg Subcutaneous Q24H  . furosemide  20 mg Oral Daily  . gabapentin  100 mg Oral QHS  . guaiFENesin  600 mg Oral BID  . ipratropium-albuterol  3 mL Nebulization Q4H  . levofloxacin  750 mg Oral Daily  . methylPREDNISolone (SOLU-MEDROL) injection  60 mg Intravenous Q8H  . metoprolol tartrate  25 mg Oral BID  . mirtazapine  15 mg Oral QHS  . mupirocin ointment   Nasal BID  . nicotine  14 mg Transdermal Daily  . OLANZapine zydis  5 mg Oral QHS  . phenytoin  300 mg Oral QHS  . sertraline  100 mg Oral Daily  . simvastatin  20 mg Oral QHS  . sodium chloride HYPERTONIC  4 mL Nebulization BID    Assessment/Plan:  1. Acute on chronic respiratory failure with Hypoxia and hypercapnia. Patient required BiPAP on presentation. Now back to her baseline oxygen 2 L nasal cannula. She has improved clinically, however, not at baseline  2. COPD exacerbation Due to acute bronchitis. Taper IV Solu-Medrol . Continue budesonide nebulizers, DuoNeb nebulizer, change frequency to every 4 hours, follow clinically. Continue Levaquin. Very concerned about readmission since the patient is a smoker. Sputum cultures are submitted today 3. Tobacco abuse. Patient must stop smoking. Discussed this patient for 3 minutes, nicotine replacement therapy to be continued  4. Seizure disorder on Dilantin, Dilantin level is low at 5.5, advancing Dilantin, recheck again in the morning 5. Anxiety depression. Psychiatric medications reordered , comfortable 6. Psychogenic  polydipsia, continue fluid restrictions as patient remains hyponatremic, urine osmolality was low at 233, however, could be altered due to Lasix 7. History of neuropathy, continue Neurontin, no complaints 8. Weakness. Family picked up her walker from the pace program and her oxygen. Physical therapist saw patient and recommended home health services, patient is refusing physical therapy today, changing her activity to Out of bed to 4 times a day 9. Lower extremity edema. Restarted oral Lasix, recheck BMP tomorrow morning 10. Constipation- lactulose ordered, last bowel movement was 08/24/2015  Code Status:     Code Status Orders        Start     Ordered   08/19/15 1318  Do not attempt resuscitation (DNR)   Continuous    Question Answer Comment  In the event of cardiac or respiratory ARREST Do not call a "code blue"   In the event of cardiac or respiratory ARREST Do not perform Intubation, CPR, defibrillation or ACLS   In the event of cardiac or respiratory ARREST Use medication by any route, position, wound care, and other measures to relive pain and suffering. May use oxygen, suction and manual treatment of airway obstruction as needed for comfort.      08/19/15 1317    Code Status History    Date Active Date Inactive Code Status Order ID Comments User Context   08/19/2015  1:04 PM 08/19/2015  1:17 PM Full Code [id number]  Eugenie Norrie, NP ED   05/22/2015  2:47 PM 06/07/2015  5:45 PM DNR [id number]  Stephanie Acre, MD Inpatient   05/22/2015 11:15 AM 05/22/2015  2:47 PM Full Code [id number]  Wyatt Haste, MD ED    Advance Directive Documentation        Most Recent Value   Type of Advance Directive  Out of facility DNR (pink MOST or yellow form)   Pre-existing out of facility DNR order (yellow form or pink MOST  form)     "MOST" Form in Place?       Family Communication: Dr. Fonnie BirkenheadWhiting spoke with the daughter on the phone recently. When patient is ready for discharge can call son-in-law and the had  [phone]. Disposition Plan: Potentially home In the next day or so depending on clinical course.  Antibiotics:  Levaquin  Time spent: 35 minutes  Smith InternationalVAICKUTE,Kerryann Allaire  [employer]

## 2015-08-26 DIAGNOSIS — F329 Major depressive disorder, single episode, unspecified: Secondary | ICD-10-CM

## 2015-08-26 DIAGNOSIS — J209 Acute bronchitis, unspecified: Secondary | ICD-10-CM

## 2015-08-26 DIAGNOSIS — F32A Depression, unspecified: Secondary | ICD-10-CM

## 2015-08-26 DIAGNOSIS — G40909 Epilepsy, unspecified, not intractable, without status epilepticus: Secondary | ICD-10-CM

## 2015-08-26 DIAGNOSIS — Z716 Tobacco abuse counseling: Secondary | ICD-10-CM

## 2015-08-26 DIAGNOSIS — E871 Hypo-osmolality and hyponatremia: Secondary | ICD-10-CM

## 2015-08-26 DIAGNOSIS — R531 Weakness: Secondary | ICD-10-CM

## 2015-08-26 DIAGNOSIS — K5900 Constipation, unspecified: Secondary | ICD-10-CM

## 2015-08-26 DIAGNOSIS — F419 Anxiety disorder, unspecified: Secondary | ICD-10-CM

## 2015-08-26 DIAGNOSIS — J441 Chronic obstructive pulmonary disease with (acute) exacerbation: Secondary | ICD-10-CM

## 2015-08-26 LAB — CULTURE, RESPIRATORY: CULTURE: NORMAL

## 2015-08-26 LAB — BASIC METABOLIC PANEL
Anion gap: 7 (ref 5–15)
BUN: 20 mg/dL (ref 6–20)
CO2: 35 mmol/L — ABNORMAL HIGH (ref 22–32)
CREATININE: 0.64 mg/dL (ref 0.44–1.00)
Calcium: 8.4 mg/dL — ABNORMAL LOW (ref 8.9–10.3)
Chloride: 91 mmol/L — ABNORMAL LOW (ref 101–111)
Glucose, Bld: 128 mg/dL — ABNORMAL HIGH (ref 65–99)
Potassium: 4 mmol/L (ref 3.5–5.1)
SODIUM: 133 mmol/L — AB (ref 135–145)

## 2015-08-26 LAB — PHENYTOIN LEVEL, TOTAL: PHENYTOIN LVL: 8.4 ug/mL — AB (ref 10.0–20.0)

## 2015-08-26 LAB — CULTURE, RESPIRATORY W GRAM STAIN

## 2015-08-26 MED ORDER — NICOTINE 14 MG/24HR TD PT24: 14.0000 mg | MEDICATED_PATCH | Freq: Every day | TRANSDERMAL | Status: AC

## 2015-08-26 MED ORDER — MUPIROCIN 2 % EX OINT: TOPICAL_OINTMENT | Freq: Two times a day (BID) | CUTANEOUS | Status: AC

## 2015-08-26 MED ORDER — SODIUM CHLORIDE 3 % IN NEBU: 4.0000 mL | INHALATION_SOLUTION | Freq: Two times a day (BID) | RESPIRATORY_TRACT | Status: AC

## 2015-08-26 MED ORDER — PREDNISONE 10 MG (21) PO TBPK: 10.0000 mg | ORAL_TABLET | Freq: Every day | ORAL | Status: DC

## 2015-08-26 MED ORDER — LEVOFLOXACIN 750 MG PO TABS: 750.0000 mg | ORAL_TABLET | Freq: Every day | ORAL | Status: DC

## 2015-08-26 NOTE — Discharge Summary (Signed)
[facility] - [place] at [facility]   PATIENT NAME: Candace Woods    MR#:  [id number]  DATE OF BIRTH:  Dec 03, 1940  DATE OF ADMISSION:  08/19/2015 ADMITTING PHYSICIAN: No admitting provider for patient encounter.  DATE OF DISCHARGE: 08/26/2015 11:25 AM  PRIMARY CARE PHYSICIAN: Bobbye Morton, MD     ADMISSION DIAGNOSIS:  Hypokalemia [E87.6] Hyponatremia [E87.1] Acute on chronic respiratory failure with hypoxia and hypercapnia (HCC) [J96.21, J96.22]  DISCHARGE DIAGNOSIS:  Principal Problem:   Acute respiratory failure with hypercapnia (HCC) Active Problems:   COPD exacerbation (HCC)   Acute bronchitis   Tobacco abuse counseling   Hyponatremia   Generalized weakness   Constipation   Seizure disorder (HCC)   Anxiety and depression   SECONDARY DIAGNOSIS:   Past Medical History  Diagnosis Date  . COPD (chronic obstructive pulmonary disease) (HCC)   . Hypertension   . Seizures (HCC)   . Peripheral neuropathy (HCC)   . Torticollis Right  . Anxiety   . Depression   . Psychogenic polydipsia   . MRSA pneumonia (HCC)     2014  . Hyperlipemia   . Asthma     .pro HOSPITAL COURSE:  Patient is 75 year old Caucasian female with past medical History significant for history of COPD, ongoing tobacco abuse, essential hypertension, seizures, peripheral neuropathy, right torticollis, anxiety, depression, psychogenic polydipsia with hyponatremia, MRSA pneumonia in the past, hyperlipidemia, who presents to the hospital with increased lethargy, productive cough for the past 2 days. According to patient's nurse, who follows patient daily, patient was noted to be dyspneic, O2 sats were in 50s, she was given duo nebs and was placed on oxygen therapy, she was brought to emergency room and started on continuous BiPAP, admitted to stepdown unit for management of acute hypoxic hypercapnic respiratory failure due to COPD exacerbation. Chest x-ray revealed hyperexpanded lungs  suggesting COPD, chronic bronchitic changes. Patient was initiated on antibiotic therapy, steroids, inhalation therapy, her condition slowly improved, she was felt to be stable to be discharged home today, her oxygen saturation was 90% on 2 L of oxygen through nasal cannula on the day of discharge. She was counseled about tobacco risks and recommended to quit, nicotine replacement therapy was recommended. Discussion by problem: 1. Acute on chronic respiratory failure with Hypoxia and hypercapnia. Patient required BiPAP on presentation. Now back to her baseline oxygen 2 L nasal cannula. She has improved clinically, felt to be stable and back at baseline .  2. COPD exacerbation due to acute bronchitis. Taper steroids as outpatient. Continue nebulizers,  Follow up with primary care physician, pace program . Continue Levaquin for 4 more days to complete course. Sputum cultures, taken on the 08/24/2015 revealed normal respiratory flora.  3. Tobacco abuse. Patient must stop smoking. Discussed this patient for 3 minutes, nicotine replacement therapy to be continued as outpatient 4. Seizure disorder, on Dilantin, Dilantin level was low on admission to the hospital at 5.5, Dilantin was advanced, level was rechecked today, therapeutic at 8.4, recheck and follow as outpatient closely, advanced medications if needed 5. Anxiety depression. Psychiatric medications to be continued 6. Psychogenic polydipsia, continue fluid restrictions, salt tablets, Lasix, patient  remains hyponatremic with sodium level of 133, stable, follow-up as outpatient. Intermittently 7. History of neuropathy, continue Neurontin, no complaints 8. Weakness.  Physical therapist saw patient and recommended home health services, she refused skilled nursing facility placement for rehabilitation, patient is to follow-up with PACE program 9. Lower extremity edema. Restarted oral Lasix, follow as  outpatient 10. Constipation- lactulose ordered, last  bowel movement was 08/24/2015   DISCHARGE CONDITIONS:   Stable  CONSULTS OBTAINED:     DRUG ALLERGIES:   Allergies  Allergen Reactions  . Aspirin   . Codeine   . Contrast Media [Iodinated Diagnostic Agents] Hives    Hives with contrast media injections  . Penicillins   . Sulfa Antibiotics     DISCHARGE MEDICATIONS:   Discharge Medication List as of 08/26/2015 10:44 AM    START taking these medications   Details  levofloxacin (LEVAQUIN) 750 MG tablet Take 1 tablet (750 mg total) by mouth daily., Starting 08/26/2015, Until Discontinued, Normal    mupirocin ointment (BACTROBAN) 2 % Place into the nose 2 (two) times daily., Starting 08/26/2015, Until Discontinued, Normal    nicotine (NICODERM CQ - DOSED IN MG/24 HOURS) 14 mg/24hr patch Place 1 patch (14 mg total) onto the skin daily., Starting 08/26/2015, Until Discontinued, Normal    predniSONE (STERAPRED UNI-PAK 21 TAB) 10 MG (21) TBPK tablet Take 1 tablet (10 mg total) by mouth daily. Please take 6 pills on the day one, then taper by 1 pill every 2 days until finished, thank you, Starting 08/26/2015, Until Discontinued, Normal    sodium chloride HYPERTONIC 3 % nebulizer solution Take 4 mLs by nebulization 2 (two) times daily., Starting 08/26/2015, Until Discontinued, Normal      CONTINUE these medications which have NOT CHANGED   Details  acetaminophen (TYLENOL) 650 MG CR tablet Take 650 mg by mouth every 12 (twelve) hours., Until Discontinued, Historical Med    albuterol (PROVENTIL HFA;VENTOLIN HFA) 108 (90 Base) MCG/ACT inhaler Inhale 2 puffs into the lungs every 6 (six) hours as needed for wheezing or shortness of breath., Until Discontinued, Historical Med    albuterol (PROVENTIL) (2.5 MG/3ML) 0.083% nebulizer solution Take 2.5 mg by nebulization every 4 (four) hours as needed for wheezing or shortness of breath., Until Discontinued, Historical Med    ALPRAZolam (XANAX) 0.5 MG tablet Take 1 tablet (0.5 mg total) by mouth 2  (two) times daily as needed for anxiety., Starting 06/07/2015, Until Discontinued, Print    baclofen (LIORESAL) 10 MG tablet Take 10 mg by mouth See admin instructions. Take 1 tablet by mouth 2 times a day at (8 am and 2 pm). Take 1 tablet by mouth at bedtime as needed for muscle spasms., Until Discontinued, Historical Med    fexofenadine (ALLEGRA) 180 MG tablet Take 180 mg by mouth daily., Until Discontinued, Historical Med    Fluticasone-Salmeterol (ADVAIR) 250-50 MCG/DOSE AEPB Inhale 1 puff into the lungs 2 (two) times daily., Until Discontinued, Historical Med    furosemide (LASIX) 20 MG tablet Take 1 tablet (20 mg total) by mouth daily., Starting 06/07/2015, Until Discontinued, No Print    gabapentin (NEURONTIN) 100 MG capsule Take 100 mg by mouth daily., Until Discontinued, Historical Med    guaiFENesin-dextromethorphan (ROBITUSSIN DM) 100-10 MG/5ML syrup Take 10 mLs by mouth every 6 (six) hours as needed for cough., Until Discontinued, Historical Med    ipratropium-albuterol (DUONEB) 0.5-2.5 (3) MG/3ML SOLN Take 3 mLs by nebulization every 4 (four) hours as needed (wheeze)., Until Discontinued, Historical Med    metoprolol tartrate (LOPRESSOR) 25 MG tablet Take 1 tablet (25 mg total) by mouth 2 (two) times daily., Starting 06/07/2015, Until Discontinued, No Print    mirtazapine (REMERON SOL-TAB) 15 MG disintegrating tablet Take 1 tablet (15 mg total) by mouth at bedtime., Starting 06/07/2015, Until Discontinued, No Print    OLANZapine zydis (ZYPREXA)  5 MG disintegrating tablet Take 5 mg by mouth at bedtime., Until Discontinued, Historical Med    phenytoin (DILANTIN) 300 MG ER capsule Take 300 mg by mouth at bedtime., Until Discontinued, Historical Med    ranitidine (ZANTAC) 150 MG tablet Take 150 mg by mouth 2 (two) times daily., Until Discontinued, Historical Med    sennosides-docusate sodium (SENOKOT-S) 8.6-50 MG tablet Take 2 tablets by mouth daily. , Until Discontinued, Historical  Med    sertraline (ZOLOFT) 100 MG tablet Take 100 mg by mouth daily., Until Discontinued, Historical Med    simvastatin (ZOCOR) 20 MG tablet Take 20 mg by mouth daily at 6 PM., Until Discontinued, Historical Med    sodium chloride (OCEAN) 0.65 % nasal spray Place 1 spray into the nose as needed. For congestion, Until Discontinued, Historical Med    sodium chloride 1 g tablet Take 1 tablet (1 g total) by mouth 2 (two) times daily with a meal., Starting 06/07/2015, Until Discontinued, No Print    tamsulosin (FLOMAX) 0.4 MG CAPS capsule Take 0.4 mg by mouth daily., Until Discontinued, Historical Med    tiotropium (SPIRIVA) 18 MCG inhalation capsule Place 18 mcg into inhaler and inhale daily., Until Discontinued, Historical Med    zolpidem (AMBIEN) 5 MG tablet Take 5 mg by mouth at bedtime. , Until Discontinued, Historical Med         DISCHARGE INSTRUCTIONS:    Patient is to follow-up with primary care physician, pace program  If you experience worsening of your admission symptoms, develop shortness of breath, life threatening emergency, suicidal or homicidal thoughts you must seek medical attention immediately by calling 911 or calling your MD immediately  if symptoms less severe.  You Must read complete instructions/literature along with all the possible adverse reactions/side effects for all the Medicines you take and that have been prescribed to you. Take any new Medicines after you have completely understood and accept all the possible adverse reactions/side effects.   Please note  You were cared for by a hospitalist during your hospital stay. If you have any questions about your discharge medications or the care you received while you were in the hospital after you are discharged, you can call the unit and asked to speak with the hospitalist on call if the hospitalist that took care of you is not available. Once you are discharged, your primary care physician will handle any further  medical issues. Please note that NO REFILLS for any discharge medications will be authorized once you are discharged, as it is imperative that you return to your primary care physician (or establish a relationship with a primary care physician if you do not have one) for your aftercare needs so that they can reassess your need for medications and monitor your lab values.    Today   CHIEF COMPLAINT:   Chief Complaint  Patient presents with  . Shortness of Breath    HISTORY OF PRESENT ILLNESS:  Storie Heffern  is a 75 y.o. female with a known history of COPD, ongoing tobacco abuse, essential hypertension, seizures, peripheral neuropathy, right torticollis, anxiety, depression, psychogenic polydipsia with hyponatremia, MRSA pneumonia in the past, hyperlipidemia, who presents to the hospital with increased lethargy, productive cough for the past 2 days. According to patient's nurse, who follows patient daily, patient was noted to be dyspneic, O2 sats were in 50s, she was given duo nebs and was placed on oxygen therapy, she was brought to emergency room and started on continuous BiPAP, admitted to stepdown  unit for management of acute hypoxic hypercapnic respiratory failure due to COPD exacerbation. Chest x-ray revealed hyperexpanded lungs suggesting COPD, chronic bronchitic changes. Patient was initiated on antibiotic therapy, steroids, inhalation therapy, her condition slowly improved, she was felt to be stable to be discharged home today, her oxygen saturation was 90% on 2 L of oxygen through nasal cannula on the day of discharge. She was counseled about tobacco risks and recommended to quit, nicotine replacement therapy was recommended. Discussion by problem: 11. Acute on chronic respiratory failure with Hypoxia and hypercapnia. Patient required BiPAP on presentation. Now back to her baseline oxygen 2 L nasal cannula. She has improved clinically, felt to be stable and back at baseline .  12. COPD  exacerbation due to acute bronchitis. Taper steroids as outpatient. Continue nebulizers,  Follow up with primary care physician, pace program . Continue Levaquin for 4 more days to complete course. Sputum cultures, taken on the 08/24/2015 revealed normal respiratory flora.  13. Tobacco abuse. Patient must stop smoking. Discussed this patient for 3 minutes, nicotine replacement therapy to be continued as outpatient 14. Seizure disorder, on Dilantin, Dilantin level was low on admission to the hospital at 5.5, Dilantin was advanced, level was rechecked today, therapeutic at 8.4, recheck and follow as outpatient closely, advanced medications if needed 15. Anxiety depression. Psychiatric medications to be continued 16. Psychogenic polydipsia, continue fluid restrictions, salt tablets, Lasix, patient  remains hyponatremic with sodium level of 133, stable, follow-up as outpatient. Intermittently 17. History of neuropathy, continue Neurontin, no complaints 18. Weakness.  Physical therapist saw patient and recommended home health services, she refused skilled nursing facility placement for rehabilitation, patient is to follow-up with PACE program 19. Lower extremity edema. Restarted oral Lasix, follow as outpatient 20. Constipation- lactulose ordered, last bowel movement was 08/24/2015    VITAL SIGNS:  Blood pressure 105/68, pulse 93, temperature 98.3 F (36.8 C), temperature source Oral, resp. rate 20, height 5\' 4"  (1.626 m), weight 69.655 kg (153 lb 9 oz), SpO2 90 %.  I/O:  No intake or output data in the 24 hours ending 08/26/15 1207  PHYSICAL EXAMINATION:  GENERAL:  75 y.o.-year-old patient lying in the bed with no acute distress.  EYES: Pupils equal, round, reactive to light and accommodation. No scleral icterus. Extraocular muscles intact.  HEENT: Head atraumatic, normocephalic. Oropharynx and nasopharynx clear.  NECK:  Supple, no jugular venous distention. No thyroid enlargement, no tenderness.   LUNGS: Some diminished breath sounds bilaterally, scattered lower lung area wheezing, few rales,rhonchi , no crepitations. No use of accessory muscles of respiration.  CARDIOVASCULAR: S1, S2 normal. No murmurs, rubs, or gallops.  ABDOMEN: Soft, non-tender, non-distended. Bowel sounds present. No organomegaly or mass.  EXTREMITIES: No pedal edema, cyanosis, or clubbing.  NEUROLOGIC: Cranial nerves II through XII are intact. Muscle strength 5/5 in all extremities. Sensation intact. Gait not checked.  PSYCHIATRIC: The patient is alert and oriented x 3.  SKIN: No obvious rash, lesion, or ulcer.   DATA REVIEW:   CBC  Recent Labs Lab 08/24/15 0544  WBC 5.7  HGB 13.1  HCT 39.1  PLT 190    Chemistries   Recent Labs Lab 08/20/15 0504  08/26/15 0423  NA 132*  < > 133*  K 3.2*  < > 4.0  CL 81*  < > 91*  CO2 43*  < > 35*  GLUCOSE 153*  < > 128*  BUN 11  < > 20  CREATININE 0.53  < > 0.64  CALCIUM 8.4*  < >  8.4*  MG 1.9  --   --   < > = values in this interval not displayed.  Cardiac Enzymes No results for input(s): TROPONINI in the last 168 hours.  Microbiology Results  Results for orders placed or performed during the hospital encounter of 08/19/15  Culture, blood (routine x 2)     Status: None   Collection Time: 08/19/15 11:29 AM  Result Value Ref Range Status   Specimen Description BLOOD LEFT ANTECUBITAL  Final   Special Requests BOTTLES DRAWN AEROBIC AND ANAEROBIC  6CC  Final   Culture NO GROWTH 5 DAYS  Final   Report Status 08/24/2015 FINAL  Final  Culture, blood (routine x 2)     Status: None   Collection Time: 08/19/15 11:29 AM  Result Value Ref Range Status   Specimen Description BLOOD LEFT HAND  Final   Special Requests BOTTLES DRAWN AEROBIC AND ANAEROBIC  5CC  Final   Culture NO GROWTH 5 DAYS  Final   Report Status 08/24/2015 FINAL  Final  MRSA PCR Screening     Status: Abnormal   Collection Time: 08/19/15  3:32 PM  Result Value Ref Range Status   MRSA by  PCR POSITIVE (A) NEGATIVE Final    Comment:        The GeneXpert MRSA Assay (FDA approved for NASAL specimens only), is one component of a comprehensive MRSA colonization surveillance program. It is not intended to diagnose MRSA infection nor to guide or monitor treatment for MRSA infections. RESULT CALLED TO, READ BACK BY AND VERIFIED WITH: ADAM SCARBOROUGH 08/19/15 @ 1652  MLK   Urine culture     Status: None   Collection Time: 08/19/15  6:24 PM  Result Value Ref Range Status   Specimen Description URINE, RANDOM  Final   Special Requests NONE  Final   Culture NO GROWTH [facility]   Final   Report Status 08/22/2015 FINAL  Final  Culture, expectorated sputum-assessment     Status: None   Collection Time: 08/24/15 10:57 AM  Result Value Ref Range Status   Specimen Description SPUTUM  Final   Special Requests NONE  Final   Sputum evaluation THIS SPECIMEN IS ACCEPTABLE FOR SPUTUM CULTURE  Final   Report Status 08/24/2015 FINAL  Final  Culture, respiratory (NON-Expectorated)     Status: None   Collection Time: 08/24/15 10:57 AM  Result Value Ref Range Status   Specimen Description SPUTUM  Final   Special Requests NONE Reflexed from [id number]  Final   Gram Stain   Final    RARE WBC PRESENT, PREDOMINANTLY PMN RARE SQUAMOUS EPITHELIAL CELLS PRESENT ABUNDANT GRAM POSITIVE RODS FEW GRAM VARIABLE ROD RARE YEAST    Culture   Final    Consistent with normal respiratory flora. [facility]     Report Status 08/26/2015 FINAL  Final  Culture, expectorated sputum-assessment     Status: None   Collection Time: 08/25/15  7:51 AM  Result Value Ref Range Status   Specimen Description SPUTUM  Final   Special Requests NONE  Final   Sputum evaluation THIS SPECIMEN IS ACCEPTABLE FOR SPUTUM CULTURE  Final   Report Status 08/25/2015 FINAL  Final    RADIOLOGY:  No results found.  EKG:   Orders placed or performed during the hospital encounter of  08/19/15  . ED EKG  . ED EKG  . EKG 12-Lead  . EKG 12-Lead  . EKG 12-Lead  . EKG 12-Lead  Management plans discussed with the patient, family and they are in agreement.  CODE STATUS:     Code Status Orders        Start     Ordered   08/19/15 1318  Do not attempt resuscitation (DNR)   Continuous    Question Answer Comment  In the event of cardiac or respiratory ARREST Do not call a "code blue"   In the event of cardiac or respiratory ARREST Do not perform Intubation, CPR, defibrillation or ACLS   In the event of cardiac or respiratory ARREST Use medication by any route, position, wound care, and other measures to relive pain and suffering. May use oxygen, suction and manual treatment of airway obstruction as needed for comfort.      08/19/15 1317    Code Status History    Date Active Date Inactive Code Status Order ID Comments User Context   08/19/2015  1:04 PM 08/19/2015  1:17 PM Full Code [id number]  Eugenie Norrie, NP ED   05/22/2015  2:47 PM 06/07/2015  5:45 PM DNR [id number]  Stephanie Acre, MD Inpatient   05/22/2015 11:15 AM 05/22/2015  2:47 PM Full Code [id number]  Wyatt Haste, MD ED    Advance Directive Documentation        Most Recent Value   Type of Advance Directive  Out of facility DNR (pink MOST or yellow form)   Pre-existing out of facility DNR order (yellow form or pink MOST form)     "MOST" Form in Place?        TOTAL TIME TAKING CARE OF THIS PATIENT: 40 minutes.    Katharina Caper M.D on 08/26/2015 at 12:07 PM  Between 7am to 6pm - Pager - [phone]  After 6pm go to [email or web address] - password [email or web address] [facility]  [facility]  Office  [phone]  CC: Primary care physician; Bobbye Morton, MD

## 2015-08-26 NOTE — Progress Notes (Signed)
Pt being discharged home with home health, PACE program, discharge instructions reviewed with pt states understanding, pt with no complaints at discharge, no distress or discomfort noted, o2 in place at discharge

## 2015-08-26 NOTE — Care Management Important Message (Signed)
Important Message  Patient Details  Name: Candace Woods MRN: [id number] Date of Birth: 1940-07-01   Medicare Important Message Given:  Yes    Gwenette GreetBrenda S Makaela Cando, RN 08/26/2015, 10:52 AM

## 2015-08-26 NOTE — Care Management (Signed)
Discharge to home today per Dr. Winona LegatoVaickute. [facility]. Family will transport. Fleet ContrasBenda S Jhoanna Heyde RN MSN CCM Care Management [phone]

## 2015-08-29 LAB — EXPECTORATED SPUTUM ASSESSMENT W REFEX TO RESP CULTURE

## 2015-09-08 ENCOUNTER — Encounter: Payer: Self-pay | Admitting: Emergency Medicine

## 2015-09-08 ENCOUNTER — Emergency Department: Payer: Medicare (Managed Care)

## 2015-09-08 ENCOUNTER — Emergency Department
Admission: EM | Admit: 2015-09-08 | Discharge: 2015-09-13 | Disposition: E | Payer: Medicare (Managed Care) | Attending: Emergency Medicine | Admitting: Emergency Medicine

## 2015-09-08 DIAGNOSIS — Z79899 Other long term (current) drug therapy: Secondary | ICD-10-CM | POA: Diagnosis not present

## 2015-09-08 DIAGNOSIS — I10 Essential (primary) hypertension: Secondary | ICD-10-CM | POA: Insufficient documentation

## 2015-09-08 DIAGNOSIS — J449 Chronic obstructive pulmonary disease, unspecified: Secondary | ICD-10-CM | POA: Insufficient documentation

## 2015-09-08 DIAGNOSIS — F17210 Nicotine dependence, cigarettes, uncomplicated: Secondary | ICD-10-CM | POA: Diagnosis not present

## 2015-09-08 DIAGNOSIS — R092 Respiratory arrest: Secondary | ICD-10-CM | POA: Diagnosis not present

## 2015-09-08 DIAGNOSIS — J45909 Unspecified asthma, uncomplicated: Secondary | ICD-10-CM | POA: Insufficient documentation

## 2015-09-08 DIAGNOSIS — R110 Nausea: Secondary | ICD-10-CM | POA: Diagnosis present

## 2015-09-08 LAB — BLOOD GAS, ARTERIAL
ACID-BASE EXCESS: 11 mmol/L — AB (ref 0.0–3.0)
ALLENS TEST (PASS/FAIL): POSITIVE — AB
Bicarbonate: 40.8 mEq/L — ABNORMAL HIGH (ref 21.0–28.0)
FIO2: 100
O2 SAT: 99.9 %
PATIENT TEMPERATURE: 37
pCO2 arterial: 81 mmHg (ref 32.0–48.0)
pH, Arterial: 7.31 — ABNORMAL LOW (ref 7.350–7.450)
pO2, Arterial: 327 mmHg — ABNORMAL HIGH (ref 83.0–108.0)

## 2015-09-08 LAB — CBC WITH DIFFERENTIAL/PLATELET
Basophils Absolute: 0.1 10*3/uL (ref 0–0.1)
Basophils Relative: 1 %
EOS ABS: 0.1 10*3/uL (ref 0–0.7)
EOS PCT: 2 %
HCT: 42 % (ref 35.0–47.0)
Hemoglobin: 14.1 g/dL (ref 12.0–16.0)
LYMPHS ABS: 2.1 10*3/uL (ref 1.0–3.6)
Lymphocytes Relative: 30 %
MCH: 31.5 pg (ref 26.0–34.0)
MCHC: 33.5 g/dL (ref 32.0–36.0)
MCV: 94.2 fL (ref 80.0–100.0)
MONO ABS: 0.7 10*3/uL (ref 0.2–0.9)
MONOS PCT: 10 %
Neutro Abs: 3.9 10*3/uL (ref 1.4–6.5)
Neutrophils Relative %: 57 %
PLATELETS: 301 10*3/uL (ref 150–440)
RBC: 4.46 MIL/uL (ref 3.80–5.20)
RDW: 16.1 % — AB (ref 11.5–14.5)
WBC: 6.9 10*3/uL (ref 3.6–11.0)

## 2015-09-08 LAB — COMPREHENSIVE METABOLIC PANEL
ALT: 19 U/L (ref 14–54)
ANION GAP: 11 (ref 5–15)
AST: 24 U/L (ref 15–41)
Albumin: 3.5 g/dL (ref 3.5–5.0)
Alkaline Phosphatase: 154 U/L — ABNORMAL HIGH (ref 38–126)
BUN: <5 mg/dL — ABNORMAL LOW (ref 6–20)
CHLORIDE: 90 mmol/L — AB (ref 101–111)
CO2: 36 mmol/L — AB (ref 22–32)
Calcium: 9.1 mg/dL (ref 8.9–10.3)
Creatinine, Ser: 0.63 mg/dL (ref 0.44–1.00)
GFR calc non Af Amer: >60 mL/min (ref >60)
Glucose, Bld: 117 mg/dL — ABNORMAL HIGH (ref 65–99)
POTASSIUM: 2.7 mmol/L — AB (ref 3.5–5.1)
SODIUM: 137 mmol/L (ref 135–145)
Total Bilirubin: 0.4 mg/dL (ref 0.3–1.2)
Total Protein: 7.3 g/dL (ref 6.5–8.1)

## 2015-09-08 LAB — TROPONIN I

## 2015-09-08 LAB — LACTIC ACID, PLASMA: LACTIC ACID, VENOUS: 1.3 mmol/L (ref 0.5–1.9)

## 2015-09-08 LAB — LIPASE, BLOOD: Lipase: 12 U/L (ref 11–51)

## 2015-09-08 MED ORDER — ROCURONIUM BROMIDE 50 MG/5ML IV SOLN
100.0000 mg | Freq: Once | INTRAVENOUS | Status: AC
  Administered 2015-09-08: 100 mg via INTRAVENOUS

## 2015-09-08 MED ORDER — SODIUM CHLORIDE 0.9 % IV BOLUS (SEPSIS)
1000.0000 mL | Freq: Once | INTRAVENOUS | Status: AC
  Administered 2015-09-08: 1000 mL via INTRAVENOUS

## 2015-09-08 MED ORDER — ONDANSETRON HCL 4 MG/2ML IJ SOLN
4.0000 mg | Freq: Once | INTRAMUSCULAR | Status: AC
  Administered 2015-09-08: 4 mg via INTRAVENOUS
  Filled 2015-09-08: qty 2

## 2015-09-08 MED ORDER — PROPOFOL 1000 MG/100ML IV EMUL
5.0000 ug/kg/min | Freq: Once | INTRAVENOUS | Status: DC
  Filled 2015-09-08: qty 100

## 2015-09-08 MED ORDER — VANCOMYCIN HCL IN DEXTROSE 1-5 GM/200ML-% IV SOLN
1000.0000 mg | Freq: Once | INTRAVENOUS | Status: AC
  Administered 2015-09-08: 1000 mg via INTRAVENOUS
  Filled 2015-09-08: qty 200

## 2015-09-08 MED ORDER — DEXTROSE 5 % IV SOLN
2.0000 g | Freq: Once | INTRAVENOUS | Status: DC
  Filled 2015-09-08: qty 2

## 2015-09-08 MED ORDER — NALOXONE HCL 2 MG/2ML IJ SOSY
PREFILLED_SYRINGE | INTRAMUSCULAR | Status: AC
  Administered 2015-09-08: 2 mg via INTRAVENOUS
  Filled 2015-09-08: qty 2

## 2015-09-08 MED ORDER — NALOXONE HCL 2 MG/2ML IJ SOSY
2.0000 mg | PREFILLED_SYRINGE | Freq: Once | INTRAMUSCULAR | Status: AC
  Administered 2015-09-08: 2 mg via INTRAVENOUS

## 2015-09-08 MED ORDER — ETOMIDATE 2 MG/ML IV SOLN
20.0000 mg | Freq: Once | INTRAVENOUS | Status: AC
  Administered 2015-09-08: 20 mg via INTRAVENOUS

## 2015-09-09 LAB — BLOOD CULTURE ID PANEL (REFLEXED)
Acinetobacter baumannii: NOT DETECTED
CANDIDA KRUSEI: NOT DETECTED
CANDIDA PARAPSILOSIS: NOT DETECTED
CANDIDA TROPICALIS: NOT DETECTED
CARBAPENEM RESISTANCE: NOT DETECTED
Candida albicans: NOT DETECTED
Candida glabrata: NOT DETECTED
ENTEROBACTERIACEAE SPECIES: NOT DETECTED
ENTEROCOCCUS SPECIES: NOT DETECTED
Enterobacter cloacae complex: NOT DETECTED
Escherichia coli: NOT DETECTED
Haemophilus influenzae: NOT DETECTED
KLEBSIELLA OXYTOCA: NOT DETECTED
KLEBSIELLA PNEUMONIAE: NOT DETECTED
LISTERIA MONOCYTOGENES: NOT DETECTED
Methicillin resistance: NOT DETECTED
Neisseria meningitidis: NOT DETECTED
PROTEUS SPECIES: NOT DETECTED
PSEUDOMONAS AERUGINOSA: NOT DETECTED
SERRATIA MARCESCENS: NOT DETECTED
STAPHYLOCOCCUS AUREUS BCID: NOT DETECTED
STAPHYLOCOCCUS SPECIES: NOT DETECTED
STREPTOCOCCUS AGALACTIAE: NOT DETECTED
Streptococcus pneumoniae: NOT DETECTED
Streptococcus pyogenes: NOT DETECTED
Streptococcus species: NOT DETECTED
VANCOMYCIN RESISTANCE: NOT DETECTED

## 2015-09-12 LAB — CULTURE, BLOOD (ROUTINE X 2)

## 2015-09-13 DIAGNOSIS — 419620001 Death: Secondary | SNOMED CT | POA: Diagnosis not present

## 2015-09-13 LAB — CULTURE, BLOOD (ROUTINE X 2): CULTURE: NO GROWTH

## 2015-09-13 NOTE — ED Triage Notes (Signed)
Pt comes into the ED via EMS from home c/o N/V.  Patient talking incomprehensibly upon hospital transportation.  Patient presents actively vomiting and cyanotic.  VS stable and End tidal WDL per EMS.  Patient has no complaints other than vomiting.  H/o COPD.

## 2015-09-13 NOTE — ED Notes (Signed)
Family at bedside.  Tearful.  Comfort basket provided to family.  No other needs requested at this time.  Chaplain called and will be coming to room.

## 2015-09-13 NOTE — ED Notes (Signed)
Pt was brought in by EMS without DNR/DNI form.  EDP and Ignacia Marvel, RN were present to receive report from EMS.  No family present at bedside and pt became hypoxic.  Pt had to be emergently intubated per Dr Ezequiel Essex orders.  Fax was received of patient's DNR/DNI after intubation done and EDP notified of this.  EDP spoke with daughter and daughter requested that patient be extubated.  Pt extubated at 1623 by EDP per family request.  Family at bedside now.

## 2015-09-13 NOTE — ED Notes (Signed)
Daughter took patient's dentures and glasses, but left rings on patients hands.  Daughter states that pt wanted to be buried in her rings.
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
## 2015-09-13 NOTE — ED Notes (Signed)
Patient taken to morgue by orderly at this time.

## 2015-09-13 DEATH — deceased
